# Patient Record
Sex: Male | Born: 1970
Health system: Southern US, Community
[De-identification: ages and names within clinical notes are randomized; demographics above are authoritative.]

## PROBLEM LIST (undated history)

## (undated) DIAGNOSIS — E269 Hyperaldosteronism, unspecified: Secondary | ICD-10-CM

## (undated) DIAGNOSIS — I719 Aortic aneurysm of unspecified site, without rupture: Secondary | ICD-10-CM

## (undated) DIAGNOSIS — M1990 Unspecified osteoarthritis, unspecified site: Secondary | ICD-10-CM

## (undated) DIAGNOSIS — I10 Essential (primary) hypertension: Secondary | ICD-10-CM

## (undated) DIAGNOSIS — J189 Pneumonia, unspecified organism: Secondary | ICD-10-CM

## (undated) DIAGNOSIS — M109 Gout, unspecified: Secondary | ICD-10-CM

## (undated) DIAGNOSIS — I4819 Other persistent atrial fibrillation: Secondary | ICD-10-CM

## (undated) DIAGNOSIS — K219 Gastro-esophageal reflux disease without esophagitis: Secondary | ICD-10-CM

## (undated) DIAGNOSIS — F419 Anxiety disorder, unspecified: Secondary | ICD-10-CM

## (undated) DIAGNOSIS — C4359 Malignant melanoma of other part of trunk: Secondary | ICD-10-CM

## (undated) HISTORY — PX: FRACTURE SURGERY: SHX138

## (undated) HISTORY — PX: JOINT REPLACEMENT: SHX530

## (undated) HISTORY — DX: Other persistent atrial fibrillation: I48.19

## (undated) HISTORY — DX: Gout, unspecified: M10.9

## (undated) HISTORY — DX: Aortic aneurysm of unspecified site, without rupture: I71.9

## (undated) HISTORY — PX: VASECTOMY: SHX75

## (undated) HISTORY — PX: MELANOMA EXCISION: SHX5266

---

## 2000-02-27 ENCOUNTER — Encounter: Admission: RE | Admit: 2000-02-27 | Discharge: 2000-02-27 | Payer: Self-pay | Admitting: *Deleted

## 2000-02-27 ENCOUNTER — Encounter: Payer: Self-pay | Admitting: *Deleted

## 2000-03-12 ENCOUNTER — Encounter: Payer: Self-pay | Admitting: *Deleted

## 2000-03-12 ENCOUNTER — Encounter: Admission: RE | Admit: 2000-03-12 | Discharge: 2000-03-12 | Payer: Self-pay | Admitting: *Deleted

## 2002-07-25 ENCOUNTER — Emergency Department (HOSPITAL_COMMUNITY): Admission: EM | Admit: 2002-07-25 | Discharge: 2002-07-25 | Payer: Self-pay | Admitting: Emergency Medicine

## 2003-02-24 ENCOUNTER — Emergency Department (HOSPITAL_COMMUNITY): Admission: EM | Admit: 2003-02-24 | Discharge: 2003-02-24 | Payer: Self-pay | Admitting: Emergency Medicine

## 2003-02-24 ENCOUNTER — Encounter: Payer: Self-pay | Admitting: Emergency Medicine

## 2003-10-29 HISTORY — PX: ORIF NONUNION / MALUNION TIBIAL FRACTURE: SUR944

## 2005-05-02 ENCOUNTER — Encounter: Admission: RE | Admit: 2005-05-02 | Discharge: 2005-05-02 | Payer: Self-pay | Admitting: Specialist

## 2005-05-03 ENCOUNTER — Observation Stay (HOSPITAL_COMMUNITY): Admission: RE | Admit: 2005-05-03 | Discharge: 2005-05-03 | Payer: Self-pay | Admitting: Specialist

## 2005-05-07 ENCOUNTER — Inpatient Hospital Stay (HOSPITAL_COMMUNITY): Admission: RE | Admit: 2005-05-07 | Discharge: 2005-05-10 | Payer: Self-pay | Admitting: Orthopedic Surgery

## 2007-06-13 ENCOUNTER — Emergency Department (HOSPITAL_COMMUNITY): Admission: EM | Admit: 2007-06-13 | Discharge: 2007-06-13 | Payer: Self-pay | Admitting: Emergency Medicine

## 2007-06-14 ENCOUNTER — Ambulatory Visit: Payer: Self-pay | Admitting: Internal Medicine

## 2007-06-14 ENCOUNTER — Inpatient Hospital Stay (HOSPITAL_COMMUNITY): Admission: EM | Admit: 2007-06-14 | Discharge: 2007-06-16 | Payer: Self-pay | Admitting: Emergency Medicine

## 2007-06-16 ENCOUNTER — Inpatient Hospital Stay (HOSPITAL_COMMUNITY): Admission: AD | Admit: 2007-06-16 | Discharge: 2007-06-18 | Payer: Self-pay | Admitting: Psychiatry

## 2007-06-16 ENCOUNTER — Ambulatory Visit: Payer: Self-pay | Admitting: Psychiatry

## 2011-03-12 NOTE — Discharge Summary (Signed)
NAMEAIMAR, Gregory Barrett                ACCOUNT NO.:  [id number]   MEDICAL RECORD NO.:  [id number]          PATIENT TYPE:  INP   LOCATION:  1440                         FACILITY:  [facility]   PHYSICIAN:  Casimiro Needle B. Sherene Sires, MD, FCCPDATE OF BIRTH:  11-09-72   DATE OF ADMISSION:  06/14/2007  DATE OF DISCHARGE:  06/16/2007                               DISCHARGE SUMMARY   DISCHARGE DIAGNOSES:  1. Status post acute respiratory failure - ventilator dependent      respiratory failure secondary to benzodiazepine overdose      (resolved).  2. Anxiety/depression status post attempted suicide, resolved altered      mental status following benzodiazepine overdose.   LABORATORY DATA:  Urine drug screen obtained on June 13, 2007  demonstrated positive for benzodiazepines only.  Salicylate level on  June 14, 2007 was less than 4, ethanol level less than 5.  June 14, 2007 sodium 143, potassium 3.5, chloride 111, CO2 26, glucose 103, BUN  12, creatinine 1.01.  White blood cell count was 7.7, hemoglobin 14.8,  hematocrit 42.1, platelet count 353.   BRIEF HISTORY:  This is a 40 year old white male who expressed suicidal  ideations in the evening on June 13, 2007.  He was sent home after  initial psychiatric screening in the emergency room.  After arriving at  home he reportedly took 25 Xanax tablets which he had prescribed for  anxiety at home at approximately 7 p.m. on the 16th.  The [place]  pulmonary critical care team was asked to evaluate.  On initial  evaluation he was obtunded following suicide attempt.  He was intubated  and placed on mechanical ventilation and transferred to the intensive  care.   HOSPITAL COURSE BY DISCHARGE DIAGNOSES:  1. Acute respiratory failure/ventilator-dependent respiratory failure      in the setting of altered mental status following benzodiazepine      overdose.  Gregory Barrett as previously mentioned was placed on      mechanical ventilation following intentional  Xanax overdose.  He      was transferred to the intensive care and maintained on mechanical      ventilation until he regained normal level of consciousness.  He      was successfully extubated on June 14, 2007 which was later on in      the day after initial evaluation.  Upon time of discharge Mr.      Barrett is breathing without difficulty on room air.  His breath      sounds are clear to auscultation.  He has no cough, no shortness of      breath or chest discomfort.  There have been no pulmonary sequelae      from the above noted events.  2. Depression, anxiety status post altered mental status in the      setting of intentional benzodiazepine overdose.  As previously      mentioned Gregory Barrett had been seen earlier on the 16th in the      emergency room expressing concerns over desiring to harm self.  He  was released after evaluation.  He went home and proceeded to take      reportedly 25 Xanax.  He developed decreased level of consciousness      secondary to benzodiazepine overdose; therefore required short      course of mechanical ventilation.  Gregory Barrett mental status      returned to baseline later on in the day on June 14, 2007, and he      was successfully extubated.  Psychiatric consultation was obtained      by Dr. Antonietta Breach.  It was the formal impression that Mr.      Barrett should continue his scheduled Zoloft at this time.  Continue      suicide precautions and should be admitted to psychiatric inpatient      unit for dual diagnoses treatment at time of medical clearance.   DISPOSITION:  Gregory Barrett has met maximum hospital benefit from inpatient  care.  He is now medically cleared for discharge to inpatient  psychiatric unit.   PHYSICAL EXAMINATION:  VITAL SIGNS:  Upon time of discharge his  temperature is 97.9, heart rate 62, respirations 16-20, blood pressure  112/73, saturations 99% on room air.  HEAD AND NECK:  Normocephalic, no JVD or adenopathy.   Sclerae are  nonicteric.  Mucous membranes are moist.  PULMONARY:  Clear to auscultation.  CARDIAC:  Regular rate and rhythm.  ABDOMEN:  Soft, nontender without organomegaly.  GU:  Voiding without difficulty.   DISCHARGE MEDICATIONS:  Zoloft 100 mg p.o. daily.   DIET:  As tolerated.  Currently tolerating regular diet without  difficulty.   ACTIVITY:  Cleared for full independent activity with observation of  sitter until cleared by psychiatric services.  Again Gregory Barrett is now  medically cleared for discharge to the inpatient psych.      Zenia Resides, NP      Charlaine Dalton. Sherene Sires, MD, [facility]  Electronically Signed    PB/MEDQ  D:  06/16/2007  T:  06/16/2007  Job:  [id number]   cc:   Antonietta Breach, M.D.
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
## 2011-03-15 NOTE — Discharge Summary (Signed)
Gregory Barrett, RAYNER NO.:  [id number]   MEDICAL RECORD NO.:  [id number]          PATIENT TYPE:  IPS   LOCATION:  0506                          FACILITY:  [facility]   PHYSICIAN:  Geoffery Lyons, M.D.      DATE OF BIRTH:  10/18/1971   DATE OF ADMISSION:  06/16/2007  DATE OF DISCHARGE:  06/18/2007                               DISCHARGE SUMMARY   CHIEF COMPLAINT AND PRESENT ILLNESS:  This was the first admission to  [facility] for this 40 year old single white male  voluntarily admitted.  Endorsed he has a lot of stress from work, that  he brings home his stressors.  Works on an Interior and spatial designer.  He is  worried about the economy and how it affects the company.  Overdosed on  Xanax on June 13, 2007.  Endorsed that at that time he was feeling  like hurting himself but this was impulsive.  Was taken to the [facility] ED and admitted to the ICU due to respiratory distress/failure.  Admits depression for the last four weeks, anhedonia, increased stress  at work.   PAST PSYCHIATRIC HISTORY:  No prior inpatient treatment.  Had couple's  therapy with prior marriage.  Also sort out therapy after his  grandfather died in Mar 14, 2000.   ALCOHOL/DRUG HISTORY:  Drinks alcohol with intention of intoxication  every weekend, around five drinks.  No other substances.   MEDICAL HISTORY:  Migraines, tension headache.   MEDICATIONS:  Zoloft and Xanax.   PHYSICAL EXAMINATION:  Performed and failed to show any acute findings.   LABORATORY DATA:  Results not available in the chart.   MENTAL STATUS EXAM:  Alert cooperative male, good eye contact.  Casually  dressed.  Speech regular in rate, rhythm and tone.  Mood anxious,  depressed.  Affect broad.  Thought processes logical, coherent and  relevant.  No active suicidal ideas.  No homicidal ideas.  Ruminating  about the stress at work.  No hallucinations.  Cognition well-preserved.   ADMISSION DIAGNOSES:  AXIS I:  Major  depressive disorder.  Anxiety not  otherwise specified.  AXIS II:  No diagnosis.  AXIS III:  Migraines, tension headache.  AXIS IV:  Moderate.  AXIS V:  GAF upon admission 45; highest GAF in the last year 70.   HOSPITAL COURSE:  He was admitted.  He was started in individual and  group psychotherapy.  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  Unrealistic expectations.  Brings stress from work  home.  Endorsed that he puts it on his wife with increased stress,  endorsed chest pain.  Taken to [facility].  He was given Xanax, told  that he had a panic attack.  He endorsed persistent episodes of anxiety,  feeling panicky, living with girlfriend and a 66-month-old child, has a  17 year old with ex-wife.  In the family session, on June 17, 2007,  with the girlfriend, he was able to share how he was having a hard time  dealing with the stress from work.  Expressed a lot of frustration.  He  was willing to work on himself, work on Pharmacologist.  Girlfriend was  supportive.  On June 18, 2007, endorsed that he was focused, he was  committed to do well.  Endorsed that he found out that things were  addressed at work before he came in so he is going to go into a less  stressful situation.  Overall, he was encouraged, hopeful.  No suicidal  or homicidal ideation.  Feeling better than when he was admitted.   DISCHARGE DIAGNOSES:  AXIS I:  Major depressive episode.  Panic attacks.  AXIS II:  No diagnosis.  AXIS III:  Migraines, tension headache.  AXIS IV:  Moderate.  AXIS V:  GAF upon discharge 55-60.   DISCHARGE MEDICATIONS:  Zoloft 100 mg, 1-1/2 daily.   FOLLOWUP:  Daun Peacock, [facility].      Geoffery Lyons, M.D.  Electronically Signed     IL/MEDQ  D:  07/20/2007  T:  07/20/2007  Job:  [id number]
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
## 2011-08-07 ENCOUNTER — Ambulatory Visit (INDEPENDENT_AMBULATORY_CARE_PROVIDER_SITE_OTHER): Payer: Managed Care, Other (non HMO) | Admitting: Internal Medicine

## 2011-08-07 ENCOUNTER — Encounter: Payer: Self-pay | Admitting: Internal Medicine

## 2011-08-07 DIAGNOSIS — M25561 Pain in right knee: Secondary | ICD-10-CM

## 2011-08-07 DIAGNOSIS — Z0000 Encounter for general adult medical examination without abnormal findings: Secondary | ICD-10-CM

## 2011-08-07 DIAGNOSIS — M25569 Pain in unspecified knee: Secondary | ICD-10-CM

## 2011-08-07 MED ORDER — HYDROCODONE-ACETAMINOPHEN 5-325 MG PO TABS: 1.0000 | ORAL_TABLET | Freq: Four times a day (QID) | ORAL | Status: DC | PRN
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
## 2011-08-07 NOTE — Patient Instructions (Signed)
Limit your sodium (Salt) intake  Please check your blood pressure on a regular basis.  If it is consistently greater than 150/90, please make an office appointment.    It is important that you exercise regularly, at least 20 minutes 3 to 4 times per week.  If you develop chest pain or shortness of breath seek  medical attention.  You need to lose weight.  Consider a lower calorie diet and regular exercise.  Avoids foods high in acid such as tomatoes citrus juices, and spicy foods.  Avoid eating within two hours of lying down or before exercising.  Do not overheat.  Try smaller more frequent meals.  If symptoms persist, elevate the head of her bed 12 inches while sleeping.  Acid Reflux (GERD) Acid reflux is also called gastroesophageal reflux disease (GERD). Your stomach makes acid to help digest food. Acid reflux happens when acid from your stomach goes into the tube between your mouth and stomach (esophagus). Your stomach is protected from the acid, but this tube is not. When acid gets into the tube, it may cause a burning feeling in the chest (heartburn). Besides heartburn, other health problems can happen if the acid keeps going into the tube. Some causes of acid reflux include:  Being overweight.   Smoking.   Drinking alcohol.   Eating large meals.   Eating meals and then going to bed right away.   Eating certain foods.   Increased stomach acid production.  HOME CARE  Take all medicine as told by your doctor.   You may need to:   Lose weight.   Avoid alcohol.   Quit smoking.   Do not eat big meals. It is better to eat smaller meals throughout the day.   Do not eat a meal and then nap or go to bed.   Sleep with your head higher than your stomach.   Avoid foods that bother you.   You may need more tests, or you may need to see a special doctor.  GET HELP RIGHT AWAY IF:  You have chest pain that is different than before.   You have pain that goes to your arms, jaw,  or between your shoulder blades.   You throw up (vomit) blood, dark brown liquid, or your throw up looks like coffee grounds.   You have trouble swallowing.   You have trouble breathing or cannot stop coughing.   You feel dizzy or pass out.   Your skin is cool, wet, and pale.   Your medicine is not helping.  MAKE SURE YOU:    Understand these instructions.   Will watch your condition.   Will get help right away if you are not doing well or get worse.  Document Released: 04/01/2008 Document Re-Released: 01/08/2010 [facility] Patient Information 2011 [place], [place].

## 2011-08-09 LAB — BASIC METABOLIC PANEL
CO2: 30
Calcium: 9.2
Chloride: 107
Creatinine, Ser: 1.05
Potassium: 3.6
Sodium: 142

## 2011-08-09 LAB — URINALYSIS, ROUTINE W REFLEX MICROSCOPIC
Bilirubin Urine: NEGATIVE
Glucose, UA: NEGATIVE
Nitrite: NEGATIVE
Nitrite: NEGATIVE
Urobilinogen, UA: 1
pH: 6.5

## 2011-08-09 LAB — DIFFERENTIAL
Basophils Absolute: 0.1
Basophils Relative: 1
Basophils Relative: 1
Lymphs Abs: 2.1
Neutro Abs: 4.5
Neutrophils Relative %: 62

## 2011-08-09 LAB — COMPREHENSIVE METABOLIC PANEL
AST: 21
BUN: 12
GFR calc Af Amer: >60
GFR calc non Af Amer: >60
Glucose, Bld: 103 — ABNORMAL HIGH
Potassium: 3.5
Sodium: 143

## 2011-08-09 LAB — RAPID URINE DRUG SCREEN, HOSP PERFORMED
Amphetamines: NOT DETECTED
Barbiturates: NOT DETECTED
Opiates: NOT DETECTED

## 2011-08-09 LAB — CBC
HCT: 45.6
Hemoglobin: 14.8
Hemoglobin: 15.7
MCV: 88.1
Platelets: 353
RBC: 4.78
RDW: 13.5

## 2011-08-09 LAB — ETHANOL
Alcohol, Ethyl (B): <5
Alcohol, Ethyl (B): <5

## 2011-08-09 LAB — ACETAMINOPHEN LEVEL: Acetaminophen (Tylenol), Serum: <10 — ABNORMAL LOW

## 2011-08-09 LAB — SALICYLATE LEVEL: Salicylate Lvl: <4

## 2011-11-18 ENCOUNTER — Other Ambulatory Visit: Payer: Self-pay | Admitting: Internal Medicine

## 2011-11-18 NOTE — Telephone Encounter (Signed)
#  60  RF 2  

## 2011-11-18 NOTE — Telephone Encounter (Signed)
Last seen new to establish 08/07/11  Last written 08/07/11 # 60 2RF Please advise

## 2011-12-09 ENCOUNTER — Encounter: Payer: Self-pay | Admitting: Internal Medicine

## 2011-12-09 ENCOUNTER — Ambulatory Visit (INDEPENDENT_AMBULATORY_CARE_PROVIDER_SITE_OTHER): Payer: Managed Care, Other (non HMO) | Admitting: Internal Medicine

## 2011-12-09 VITALS — BP 124/84 | HR 85 | Temp 98.1°F | Wt 283.0 lb

## 2011-12-09 DIAGNOSIS — J069 Acute upper respiratory infection, unspecified: Secondary | ICD-10-CM

## 2011-12-09 MED ORDER — HYDROCODONE-HOMATROPINE 5-1.5 MG/5ML PO SYRP: 5.0000 mL | ORAL_SOLUTION | Freq: Four times a day (QID) | ORAL | Status: AC | PRN

## 2011-12-09 MED ORDER — SILDENAFIL CITRATE 100 MG PO TABS: 100.0000 mg | ORAL_TABLET | ORAL | Status: DC | PRN

## 2011-12-09 NOTE — Progress Notes (Signed)
  Subjective:    Patient ID: Gregory Barrett, male    DOB: [birth year], 41 y.o.   MRN: [id number]  HPI  41 year old patient who presents with a one-week history of chest congestion and cough earlier he had low-grade fever that has resolved he has mild sore throat fatigue and a general sense of unwellness. Cough is mildly productive of some yellow sputum.    Review of Systems  Constitutional: Positive for fever and fatigue. Negative for chills and appetite change.  HENT: Positive for congestion and postnasal drip. Negative for hearing loss, ear pain, sore throat, trouble swallowing, neck stiffness, dental problem, voice change and tinnitus.   Eyes: Negative for pain, discharge and visual disturbance.  Respiratory: Positive for cough. Negative for chest tightness, wheezing and stridor.   Cardiovascular: Negative for chest pain, palpitations and leg swelling.  Gastrointestinal: Negative for nausea, vomiting, abdominal pain, diarrhea, constipation, blood in stool and abdominal distention.  Genitourinary: Negative for urgency, hematuria, flank pain, discharge, difficulty urinating and genital sores.  Musculoskeletal: Negative for myalgias, back pain, joint swelling, arthralgias and gait problem.  Skin: Negative for rash.  Neurological: Negative for dizziness, syncope, speech difficulty, weakness, numbness and headaches.  Hematological: Negative for adenopathy. Does not bruise/bleed easily.  Psychiatric/Behavioral: Negative for behavioral problems and dysphoric mood. The patient is not nervous/anxious.        Objective:   Physical Exam  Constitutional: He is oriented to person, place, and time. He appears well-developed and well-nourished. No distress.       Blood pressure 140/94  HENT:  Head: Normocephalic.  Right Ear: External ear normal.  Left Ear: External ear normal.  Eyes: Conjunctivae and EOM are normal.  Neck: Normal range of motion.  Cardiovascular: Normal rate and normal heart  sounds.   Pulmonary/Chest: Breath sounds normal.  Abdominal: Bowel sounds are normal.  Musculoskeletal: Normal range of motion. He exhibits no edema and no tenderness.  Neurological: He is alert and oriented to person, place, and time.  Psychiatric: He has a normal mood and affect. His behavior is normal.          Assessment & Plan:   Viral URI. Will treat symptomatically Exogenous obesity weight loss encouraged Hypertension. We'll ask patient to monitor home blood pressure readings

## 2011-12-09 NOTE — Patient Instructions (Signed)
Get plenty of rest, Drink lots of  clear liquids, and use Tylenol or ibuprofen for fever and discomfort.    Limit your sodium (Salt) intake  You need to lose weight.  Consider a lower calorie diet and regular exercise.  Please check your blood pressure on a regular basis.  If it is consistently greater than 150/90, please make an office appointment.

## 2012-02-05 ENCOUNTER — Other Ambulatory Visit: Payer: Self-pay | Admitting: Internal Medicine

## 2012-02-05 NOTE — Telephone Encounter (Signed)
Sent denial with needs appt to discuss

## 2012-02-05 NOTE — Telephone Encounter (Signed)
Last seen 12/09/11 - congestion Last written 11/18/11 # 60 2RF Please advise

## 2012-02-05 NOTE — Telephone Encounter (Signed)
Why?  

## 2012-02-11 ENCOUNTER — Other Ambulatory Visit: Payer: Self-pay | Admitting: Internal Medicine

## 2012-02-12 NOTE — Telephone Encounter (Signed)
Called in norco only

## 2012-02-12 NOTE — Telephone Encounter (Signed)
norco only   #90  RF 1

## 2012-02-12 NOTE — Telephone Encounter (Signed)
Last seen 12/09/11 - URI  norco last written 11/18/11 # 60  2RF Hycodan last written 12/09/11 0RF Please advise on both meds

## 2012-05-11 ENCOUNTER — Other Ambulatory Visit: Payer: Self-pay | Admitting: Internal Medicine

## 2012-05-11 NOTE — Telephone Encounter (Signed)
Called in.

## 2012-05-11 NOTE — Telephone Encounter (Signed)
Last seen 12/11/11 - URI Last written 02/11/12 #90  0Rf Please advise

## 2012-05-11 NOTE — Telephone Encounter (Signed)
ok 

## 2012-06-25 ENCOUNTER — Ambulatory Visit (INDEPENDENT_AMBULATORY_CARE_PROVIDER_SITE_OTHER): Payer: Managed Care, Other (non HMO) | Admitting: Internal Medicine

## 2012-06-25 ENCOUNTER — Encounter: Payer: Self-pay | Admitting: Internal Medicine

## 2012-06-25 VITALS — BP 140/90 | Temp 98.3°F | Wt 281.0 lb

## 2012-06-25 DIAGNOSIS — M25561 Pain in right knee: Secondary | ICD-10-CM

## 2012-06-25 DIAGNOSIS — M25569 Pain in unspecified knee: Secondary | ICD-10-CM

## 2012-06-25 DIAGNOSIS — E6609 Other obesity due to excess calories: Secondary | ICD-10-CM | POA: Insufficient documentation

## 2012-06-25 DIAGNOSIS — E669 Obesity, unspecified: Secondary | ICD-10-CM

## 2012-06-25 MED ORDER — HYDROCODONE-ACETAMINOPHEN 10-325 MG PO TABS: 1.0000 | ORAL_TABLET | Freq: Four times a day (QID) | ORAL | Status: AC | PRN

## 2012-06-25 MED ORDER — SILDENAFIL CITRATE 100 MG PO TABS: 100.0000 mg | ORAL_TABLET | ORAL | Status: DC | PRN

## 2012-06-25 NOTE — Patient Instructions (Signed)
Take Aleve 200 mg twice daily for pain or swelling  Call or return to clinic prn if these symptoms worsen or fail to improve as anticipated.  

## 2012-06-25 NOTE — Progress Notes (Signed)
  Subjective:    Patient ID: Gregory Barrett, male    DOB: [birth year], 41 y.o.   MRN: [id number]  HPI   41 year old patient who is seen today for followup of his right knee pain.  He is followed by orthopedics Carola Frost) who feels that he will need a right knee revision with removal of the scar tissue. The patient will require a 6 weeks leave of absence and will do this at a later date. He states he takes 2 Vicodin in the morning and another 2 in the early afternoon to get through the day. He is quite active and occasionally up and down stairs;  he has swelling towards the day as well as worsening pain    Review of Systems  Musculoskeletal: Positive for joint swelling and gait problem.       Objective:   Physical Exam  Constitutional: He appears well-developed and well-nourished. No distress.       Obese. Normal blood pressure  Musculoskeletal:       Multiple scars and some soft tissue swelling about the right knee          Assessment & Plan:  Chronic right knee pain status post tibial fracture.  We'll continue analgesics and a new prescription dispensed. Will give a trial of Aleve twice daily. He'll attempt to modify his work activities. FMLA encouraged

## 2012-07-06 DIAGNOSIS — Z0279 Encounter for issue of other medical certificate: Secondary | ICD-10-CM

## 2012-12-22 ENCOUNTER — Telehealth: Payer: Self-pay | Admitting: Internal Medicine

## 2012-12-22 NOTE — Telephone Encounter (Signed)
Pt needs refill on norco 10-325mg  call into [facility]

## 2012-12-22 NOTE — Telephone Encounter (Signed)
Ok #90

## 2012-12-23 MED ORDER — HYDROCODONE-ACETAMINOPHEN 5-325 MG PO TABS: 1.0000 | ORAL_TABLET | Freq: Four times a day (QID) | ORAL | Status: DC | PRN

## 2012-12-23 NOTE — Telephone Encounter (Signed)
Pt notified Rx refill called into pharmacy for Norco.

## 2014-05-09 ENCOUNTER — Ambulatory Visit (INDEPENDENT_AMBULATORY_CARE_PROVIDER_SITE_OTHER): Payer: BC Managed Care – PPO | Admitting: Internal Medicine

## 2014-05-09 ENCOUNTER — Encounter: Payer: Self-pay | Admitting: Internal Medicine

## 2014-05-09 VITALS — BP 150/90 | HR 117 | Temp 99.8°F | Resp 20 | Ht 70.5 in | Wt 296.0 lb

## 2014-05-09 DIAGNOSIS — E669 Obesity, unspecified: Secondary | ICD-10-CM

## 2014-05-09 DIAGNOSIS — M25561 Pain in right knee: Secondary | ICD-10-CM

## 2014-05-09 DIAGNOSIS — E6609 Other obesity due to excess calories: Secondary | ICD-10-CM

## 2014-05-09 DIAGNOSIS — S93409A Sprain of unspecified ligament of unspecified ankle, initial encounter: Secondary | ICD-10-CM

## 2014-05-09 DIAGNOSIS — S93402A Sprain of unspecified ligament of left ankle, initial encounter: Secondary | ICD-10-CM

## 2014-05-09 DIAGNOSIS — M25569 Pain in unspecified knee: Secondary | ICD-10-CM

## 2014-05-09 MED ORDER — MELOXICAM 15 MG PO TABS: 15.0000 mg | ORAL_TABLET | Freq: Every day | ORAL | Status: DC

## 2014-05-09 MED ORDER — HYDROCODONE-ACETAMINOPHEN 5-325 MG PO TABS: 1.0000 | ORAL_TABLET | Freq: Four times a day (QID) | ORAL | Status: DC | PRN

## 2014-05-09 NOTE — Progress Notes (Signed)
Pre visit review using our clinic review tool, if applicable. No additional management support is needed unless otherwise documented below in the visit note. 
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
## 2014-05-09 NOTE — Patient Instructions (Addendum)
Limit your sodium (Salt) intake    It is important that you exercise regularly, at least 20 minutes 3 to 4 times per week.  If you develop chest pain or shortness of breath seek  medical attention.  You need to lose weight.  Consider a lower calorie diet and regular exercise.  Return in 6 months for follow-up Ankle Sprain An ankle sprain is an injury to the strong, fibrous tissues (ligaments) that hold the bones of your ankle joint together.  CAUSES An ankle sprain is usually caused by a fall or by twisting your ankle. Ankle sprains most commonly occur when you step on the outer edge of your foot, and your ankle turns inward. People who participate in sports are more prone to these types of injuries.  SYMPTOMS   Pain in your ankle. The pain may be present at rest or only when you are trying to stand or walk.  Swelling.  Bruising. Bruising may develop immediately or within 1 to 2 days after your injury.  Difficulty standing or walking, particularly when turning corners or changing directions. DIAGNOSIS  Your caregiver will ask you details about your injury and perform a physical exam of your ankle to determine if you have an ankle sprain. During the physical exam, your caregiver will press on and apply pressure to specific areas of your foot and ankle. Your caregiver will try to move your ankle in certain ways. An X-ray exam may be done to be sure a bone was not broken or a ligament did not separate from one of the bones in your ankle (avulsion fracture).  TREATMENT  Certain types of braces can help stabilize your ankle. Your caregiver can make a recommendation for this. Your caregiver may recommend the use of medicine for pain. If your sprain is severe, your caregiver may refer you to a surgeon who helps to restore function to parts of your skeletal system (orthopedist) or a physical therapist. [place] ice to your injury for 1-2 days or as directed by your caregiver.  Applying ice helps to reduce inflammation and pain.  Put ice in a plastic bag.  Place a towel between your skin and the bag.  Leave the ice on for 15-20 minutes at a time, every 2 hours while you are awake.  Only take over-the-counter or prescription medicines for pain, discomfort, or fever as directed by your caregiver.  Elevate your injured ankle above the level of your heart as much as possible for 2-3 days.  If your caregiver recommends crutches, use them as instructed. Gradually put weight on the affected ankle. Continue to use crutches or a cane until you can walk without feeling pain in your ankle.  If you have a plaster splint, wear the splint as directed by your caregiver. Do not rest it on anything harder than a pillow for the first 24 hours. Do not put weight on it. Do not get it wet. You may take it off to take a shower or bath.  You may have been given an elastic bandage to wear around your ankle to provide support. If the elastic bandage is too tight (you have numbness or tingling in your foot or your foot becomes cold and blue), adjust the bandage to make it comfortable.  If you have an air splint, you may blow more air into it or let air out to make it more comfortable. You may take your splint off at night and before taking a shower  or bath. Wiggle your toes in the splint several times per day to decrease swelling. SEEK MEDICAL CARE IF:   You have rapidly increasing bruising or swelling.  Your toes feel extremely cold or you lose feeling in your foot.  Your pain is not relieved with medicine. SEEK IMMEDIATE MEDICAL CARE IF:  Your toes are numb or blue.  You have severe pain that is increasing. MAKE SURE YOU:   Understand these instructions.  Will watch your condition.  Will get help right away if you are not doing well or get worse. Document Released: 10/14/2005 Document Revised: 07/08/2012 Document Reviewed: 10/26/2011 [facility] Patient Information 2015  [place], [place]. This information is not intended to replace advice given to you by your health care provider. Make sure you discuss any questions you have with your health care provider.

## 2014-06-03 ENCOUNTER — Telehealth: Payer: Self-pay | Admitting: Internal Medicine

## 2014-06-03 NOTE — Telephone Encounter (Signed)
Pt states the FMLA forms faxed to his employer today were incomplete.  Pt states he will stop by the office to pick up a copy of the form that was faxed as these have to be turned in today or he will be suspended from his job. Please return call to pt to follow up.

## 2014-06-03 NOTE — Telephone Encounter (Signed)
Pt came by office and forms completed properly, faxed to [facility] and copy given to pt to take with him for employer.

## 2014-06-28 ENCOUNTER — Telehealth: Payer: Self-pay | Admitting: Internal Medicine

## 2014-06-28 NOTE — Telephone Encounter (Signed)
Pt request refill of the following:   HYDROcodone-acetaminophen (NORCO/VICODIN) 5-325 MG per tablet ° ° ° °Phamacy:   Pick up  ° °

## 2014-06-29 MED ORDER — HYDROCODONE-ACETAMINOPHEN 5-325 MG PO TABS: 1.0000 | ORAL_TABLET | Freq: Four times a day (QID) | ORAL | Status: DC | PRN

## 2014-06-29 NOTE — Telephone Encounter (Signed)
Pt notified Rx ready for pickup. Rx printed and signed.  

## 2014-07-01 ENCOUNTER — Encounter: Payer: Self-pay | Admitting: *Deleted

## 2014-07-01 ENCOUNTER — Encounter: Payer: Self-pay | Admitting: Internal Medicine

## 2014-07-01 ENCOUNTER — Ambulatory Visit (INDEPENDENT_AMBULATORY_CARE_PROVIDER_SITE_OTHER): Payer: BC Managed Care – PPO | Admitting: Internal Medicine

## 2014-07-01 VITALS — BP 160/90 | HR 93 | Temp 98.0°F | Resp 20 | Ht 71.0 in | Wt 290.0 lb

## 2014-07-01 DIAGNOSIS — Z0000 Encounter for general adult medical examination without abnormal findings: Secondary | ICD-10-CM

## 2014-07-01 DIAGNOSIS — Z23 Encounter for immunization: Secondary | ICD-10-CM

## 2014-07-01 DIAGNOSIS — I10 Essential (primary) hypertension: Secondary | ICD-10-CM | POA: Insufficient documentation

## 2014-07-01 LAB — TSH: TSH: 1.04 u[IU]/mL (ref 0.35–4.50)

## 2014-07-01 LAB — CBC WITH DIFFERENTIAL/PLATELET
Basophils Absolute: 0.1 10*3/uL (ref 0.0–0.1)
Basophils Relative: 0.7 % (ref 0.0–3.0)
Eosinophils Absolute: 0.3 10*3/uL (ref 0.0–0.7)
Eosinophils Relative: 3.4 % (ref 0.0–5.0)
HEMATOCRIT: 44.6 % (ref 39.0–52.0)
Hemoglobin: 15.2 g/dL (ref 13.0–17.0)
Lymphocytes Relative: 23.3 % (ref 12.0–46.0)
Lymphs Abs: 1.8 10*3/uL (ref 0.7–4.0)
MCHC: 34.1 g/dL (ref 30.0–36.0)
MCV: 89.2 fl (ref 78.0–100.0)
MONOS PCT: 9.6 % (ref 3.0–12.0)
Monocytes Absolute: 0.7 10*3/uL (ref 0.1–1.0)
Neutro Abs: 4.9 10*3/uL (ref 1.4–7.7)
Neutrophils Relative %: 63 % (ref 43.0–77.0)
PLATELETS: 314 10*3/uL (ref 150.0–400.0)
RBC: 5.01 Mil/uL (ref 4.22–5.81)
RDW: 13.3 % (ref 11.5–15.5)
WBC: 7.8 10*3/uL (ref 4.0–10.5)

## 2014-07-01 LAB — COMPREHENSIVE METABOLIC PANEL
ALT: 28 U/L (ref 0–53)
AST: 21 U/L (ref 0–37)
Albumin: 4.3 g/dL (ref 3.5–5.2)
Alkaline Phosphatase: 65 U/L (ref 39–117)
BILIRUBIN TOTAL: 1.2 mg/dL (ref 0.2–1.2)
BUN: 17 mg/dL (ref 6–23)
CO2: 27 meq/L (ref 19–32)
Calcium: 8.6 mg/dL (ref 8.4–10.5)
Chloride: 105 mEq/L (ref 96–112)
Creatinine, Ser: 1.1 mg/dL (ref 0.4–1.5)
GFR: 77.68 mL/min (ref >60.00)
GLUCOSE: 77 mg/dL (ref 70–99)
Potassium: 3 mEq/L — ABNORMAL LOW (ref 3.5–5.1)
SODIUM: 141 meq/L (ref 135–145)
TOTAL PROTEIN: 7.4 g/dL (ref 6.0–8.3)

## 2014-07-01 LAB — LIPID PANEL
CHOLESTEROL: 168 mg/dL (ref 0–200)
HDL: 38 mg/dL — ABNORMAL LOW (ref >39.00)
LDL Cholesterol: 115 mg/dL — ABNORMAL HIGH (ref 0–99)
NonHDL: 130
TRIGLYCERIDES: 77 mg/dL (ref 0.0–149.0)
Total CHOL/HDL Ratio: 4
VLDL: 15.4 mg/dL (ref 0.0–40.0)

## 2014-07-01 MED ORDER — LISINOPRIL-HYDROCHLOROTHIAZIDE 20-25 MG PO TABS: 1.0000 | ORAL_TABLET | Freq: Every day | ORAL | Status: DC
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
## 2014-07-01 NOTE — Progress Notes (Signed)
Pre visit review using our clinic review tool, if applicable. No additional management support is needed unless otherwise documented below in the visit note. 

## 2014-07-01 NOTE — Patient Instructions (Signed)
Limit your sodium (Salt) intake  You need to lose weight.  Consider a lower calorie diet and regular exercise.    It is important that you exercise regularly, at least 20 minutes 3 to 4 times per week.  If you develop chest pain or shortness of breath seek  medical attention.  Please check your blood pressure on a regular basis.  If it is consistently greater than 150/90, please make an office appointment.   DASH Eating Plan DASH stands for "Dietary Approaches to Stop Hypertension." The DASH eating plan is a healthy eating plan that has been shown to reduce high blood pressure (hypertension). Additional health benefits may include reducing the risk of type 2 diabetes mellitus, heart disease, and stroke. The DASH eating plan may also help with weight loss. WHAT DO I NEED TO KNOW ABOUT THE DASH EATING PLAN? For the DASH eating plan, you will follow these general guidelines:  Choose foods with a percent daily value for sodium of less than 5% (as listed on the food label).  Use salt-free seasonings or herbs instead of table salt or sea salt.  Check with your health care provider or pharmacist before using salt substitutes.  Eat lower-sodium products, often labeled as "lower sodium" or "no salt added."  Eat fresh foods.  Eat more vegetables, fruits, and low-fat dairy products.  Choose whole grains. Look for the word "whole" as the first word in the ingredient list.  Choose fish and skinless chicken or [place] more often than red meat. Limit fish, poultry, and meat to 6 oz (170 g) each day.  Limit sweets, desserts, sugars, and sugary drinks.  Choose heart-healthy fats.  Limit cheese to 1 oz (28 g) per day.  Eat more home-cooked food and less restaurant, buffet, and fast food.  Limit fried foods.  Cook foods using methods other than frying.  Limit canned vegetables. If you do use them, rinse them well to decrease the sodium.  When eating at a restaurant, ask that your food be  prepared with less salt, or no salt if possible. WHAT FOODS CAN I EAT? Seek help from a dietitian for individual calorie needs. Grains Whole grain or whole wheat bread. Brown rice. Whole grain or whole wheat pasta. Quinoa, bulgur, and whole grain cereals. Low-sodium cereals. Corn or whole wheat flour tortillas. Whole grain cornbread. Whole grain crackers. Low-sodium crackers. Vegetables Fresh or frozen vegetables (raw, steamed, roasted, or grilled). Low-sodium or reduced-sodium tomato and vegetable juices. Low-sodium or reduced-sodium tomato sauce and paste. Low-sodium or reduced-sodium canned vegetables.  Fruits All fresh, canned (in natural juice), or frozen fruits. Meat and Other Protein Products Ground beef (85% or leaner), grass-fed beef, or beef trimmed of fat. Skinless chicken or [place]. Ground chicken or [place]. Pork trimmed of fat. All fish and seafood. Eggs. Dried beans, peas, or lentils. Unsalted nuts and seeds. Unsalted canned beans. Dairy Low-fat dairy products, such as skim or 1% milk, 2% or reduced-fat cheeses, low-fat ricotta or cottage cheese, or plain low-fat yogurt. Low-sodium or reduced-sodium cheeses. Fats and Oils Tub margarines without trans fats. Light or reduced-fat mayonnaise and salad dressings (reduced sodium). Avocado. Safflower, olive, or canola oils. Natural peanut or almond butter. Other Unsalted popcorn and pretzels. The items listed above may not be a complete list of recommended foods or beverages. Contact your dietitian for more options. WHAT FOODS ARE NOT RECOMMENDED? Grains White bread. White pasta. White rice. Refined cornbread. Bagels and croissants. Crackers that contain trans fat. Vegetables Creamed or fried vegetables.  Vegetables in a cheese sauce. Regular canned vegetables. Regular canned tomato sauce and paste. Regular tomato and vegetable juices. Fruits Dried fruits. Canned fruit in light or heavy syrup. Fruit juice. Meat and Other Protein  Products Fatty cuts of meat. Ribs, chicken wings, bacon, sausage, bologna, salami, chitterlings, fatback, hot dogs, bratwurst, and packaged luncheon meats. Salted nuts and seeds. Canned beans with salt. Dairy Whole or 2% milk, cream, half-and-half, and cream cheese. Whole-fat or sweetened yogurt. Full-fat cheeses or blue cheese. Nondairy creamers and whipped toppings. Processed cheese, cheese spreads, or cheese curds. Condiments Onion and garlic salt, seasoned salt, table salt, and sea salt. Canned and packaged gravies. Worcestershire sauce. Tartar sauce. Barbecue sauce. Teriyaki sauce. Soy sauce, including reduced sodium. Steak sauce. Fish sauce. Oyster sauce. Cocktail sauce. Horseradish. Ketchup and mustard. Meat flavorings and tenderizers. Bouillon cubes. Hot sauce. Tabasco sauce. Marinades. Taco seasonings. Relishes. Fats and Oils Butter, stick margarine, lard, shortening, ghee, and bacon fat. Coconut, palm kernel, or palm oils. Regular salad dressings. Other Pickles and olives. Salted popcorn and pretzels. The items listed above may not be a complete list of foods and beverages to avoid. Contact your dietitian for more information. WHERE CAN I FIND MORE INFORMATION? National Heart, Lung, and Blood Institute: travelstabloid.com Document Released: 10/03/2011 Document Revised: 02/28/2014 Document Reviewed: 08/18/2013 [facility] Patient Information 2015 [place], [place]. This information is not intended to replace advice given to you by your health care provider. Make sure you discuss any questions you have with your health care provider. Hypertension Hypertension, commonly called high blood pressure, is when the force of blood pumping through your arteries is too strong. Your arteries are the blood vessels that carry blood from your heart throughout your body. A blood pressure reading consists of a higher number over a lower number, such as 110/72. The higher number  (systolic) is the pressure inside your arteries when your heart pumps. The lower number (diastolic) is the pressure inside your arteries when your heart relaxes. Ideally you want your blood pressure below 120/80. Hypertension forces your heart to work harder to pump blood. Your arteries may become narrow or stiff. Having hypertension puts you at risk for heart disease, stroke, and other problems.  RISK FACTORS Some risk factors for high blood pressure are controllable. Others are not.  Risk factors you cannot control include:   Race. You may be at higher risk if you are African American.  Age. Risk increases with age.  Gender. Men are at higher risk than women before age 43 years. After age 78, women are at higher risk than men. Risk factors you can control include:  Not getting enough exercise or physical activity.  Being overweight.  Getting too much fat, sugar, calories, or salt in your diet.  Drinking too much alcohol. SIGNS AND SYMPTOMS Hypertension does not usually cause signs or symptoms. Extremely high blood pressure (hypertensive crisis) may cause headache, anxiety, shortness of breath, and nosebleed. DIAGNOSIS  To check if you have hypertension, your health care provider will measure your blood pressure while you are seated, with your arm held at the level of your heart. It should be measured at least twice using the same arm. Certain conditions can cause a difference in blood pressure between your right and left arms. A blood pressure reading that is higher than normal on one occasion does not mean that you need treatment. If one blood pressure reading is high, ask your health care provider about having it checked again. TREATMENT  Treating high blood  pressure includes making lifestyle changes and possibly taking medicine. Living a healthy lifestyle can help lower high blood pressure. You may need to change some of your habits. Lifestyle changes may include:  Following the DASH  diet. This diet is high in fruits, vegetables, and whole grains. It is low in salt, red meat, and added sugars.  Getting at least 2 hours of brisk physical activity every week.  Losing weight if necessary.  Not smoking.  Limiting alcoholic beverages.  Learning ways to reduce stress. If lifestyle changes are not enough to get your blood pressure under control, your health care provider may prescribe medicine. You may need to take more than one. Work closely with your health care provider to understand the risks and benefits. HOME CARE INSTRUCTIONS  Have your blood pressure rechecked as directed by your health care provider.   Take medicines only as directed by your health care provider. Follow the directions carefully. Blood pressure medicines must be taken as prescribed. The medicine does not work as well when you skip doses. Skipping doses also puts you at risk for problems.   Do not smoke.   Monitor your blood pressure at home as directed by your health care provider. SEEK MEDICAL CARE IF:   You think you are having a reaction to medicines taken.  You have recurrent headaches or feel dizzy.  You have swelling in your ankles.  You have trouble with your vision. SEEK IMMEDIATE MEDICAL CARE IF:  You develop a severe headache or confusion.  You have unusual weakness, numbness, or feel faint.  You have severe chest or abdominal pain.  You vomit repeatedly.  You have trouble breathing. MAKE SURE YOU:   Understand these instructions.  Will watch your condition.  Will get help right away if you are not doing well or get worse. Document Released: 10/14/2005 Document Revised: 02/28/2014 Document Reviewed: 08/06/2013 [facility] Patient Information 2015 [place], [place]. This information is not intended to replace advice given to you by your health care provider. Make sure you discuss any questions you have with your health care provider. Health Maintenance A healthy  lifestyle and preventative care can promote health and wellness.  Maintain regular health, dental, and eye exams.  Eat a healthy diet. Foods like vegetables, fruits, whole grains, low-fat dairy products, and lean protein foods contain the nutrients you need and are low in calories. Decrease your intake of foods high in solid fats, added sugars, and salt. Get information about a proper diet from your health care provider, if necessary.  Regular physical exercise is one of the most important things you can do for your health. Most adults should get at least 150 minutes of moderate-intensity exercise (any activity that increases your heart rate and causes you to sweat) each week. In addition, most adults need muscle-strengthening exercises on 2 or more days a week.   Maintain a healthy weight. The body mass index (BMI) is a screening tool to identify possible weight problems. It provides an estimate of body fat based on height and weight. Your health care provider can find your BMI and can help you achieve or maintain a healthy weight. For males 20 years and older:  A BMI below 18.5 is considered underweight.  A BMI of 18.5 to 24.9 is normal.  A BMI of 25 to 29.9 is considered overweight.  A BMI of 30 and above is considered obese.  Maintain normal blood lipids and cholesterol by exercising and minimizing your intake of saturated fat. Eat  a balanced diet with plenty of fruits and vegetables. Blood tests for lipids and cholesterol should begin at age 51 and be repeated every 5 years. If your lipid or cholesterol levels are high, you are over age 40, or you are at high risk for heart disease, you may need your cholesterol levels checked more frequently.Ongoing high lipid and cholesterol levels should be treated with medicines if diet and exercise are not working.  If you smoke, find out from your health care provider how to quit. If you do not use tobacco, do not start.  Lung cancer screening is  recommended for adults aged 46-80 years who are at high risk for developing lung cancer because of a history of smoking. A yearly low-dose CT scan of the lungs is recommended for people who have at least a 30-pack-year history of smoking and are current smokers or have quit within the past 15 years. A pack year of smoking is smoking an average of 1 pack of cigarettes a day for 1 year (for example, a 30-pack-year history of smoking could mean smoking 1 pack a day for 30 years or 2 packs a day for 15 years). Yearly screening should continue until the smoker has stopped smoking for at least 15 years. Yearly screening should be stopped for people who develop a health problem that would prevent them from having lung cancer treatment.  If you choose to drink alcohol, do not have more than 2 drinks per day. One drink is considered to be 12 oz (360 mL) of beer, 5 oz (150 mL) of wine, or 1.5 oz (45 mL) of liquor.  Avoid the use of street drugs. Do not share needles with anyone. Ask for help if you need support or instructions about stopping the use of drugs.  High blood pressure causes heart disease and increases the risk of stroke. Blood pressure should be checked at least every 1-2 years. Ongoing high blood pressure should be treated with medicines if weight loss and exercise are not effective.  If you are 48-88 years old, ask your health care provider if you should take aspirin to prevent heart disease.  Diabetes screening involves taking a blood sample to check your fasting blood sugar level. This should be done once every 3 years after age 66 if you are at a normal weight and without risk factors for diabetes. Testing should be considered at a younger age or be carried out more frequently if you are overweight and have at least 1 risk factor for diabetes.  Colorectal cancer can be detected and often prevented. Most routine colorectal cancer screening begins at the age of 56 and continues through age 30.  However, your health care provider may recommend screening at an earlier age if you have risk factors for colon cancer. On a yearly basis, your health care provider may provide home test kits to check for hidden blood in the stool. A small camera at the end of a tube may be used to directly examine the colon (sigmoidoscopy or colonoscopy) to detect the earliest forms of colorectal cancer. Talk to your health care provider about this at age 58 when routine screening begins. A direct exam of the colon should be repeated every 5-10 years through age 103, unless early forms of precancerous polyps or small growths are found.  People who are at an increased risk for hepatitis B should be screened for this virus. You are considered at high risk for hepatitis B if:  You were born  in a country where hepatitis B occurs often. Talk with your health care provider about which countries are considered high risk.  Your parents were born in a high-risk country and you have not received a shot to protect against hepatitis B (hepatitis B vaccine).  You have HIV or AIDS.  You use needles to inject street drugs.  You live with, or have sex with, someone who has hepatitis B.  You are a man who has sex with other men (MSM).  You get hemodialysis treatment.  You take certain medicines for conditions like cancer, organ transplantation, and autoimmune conditions.  Hepatitis C blood testing is recommended for all people born from [birth year] through 1963 and any individual with known risk factors for hepatitis C.  Healthy men should no longer receive prostate-specific antigen (PSA) blood tests as part of routine cancer screening. Talk to your health care provider about prostate cancer screening.  Testicular cancer screening is not recommended for adolescents or adult males who have no symptoms. Screening includes self-exam, a health care provider exam, and other screening tests. Consult with your health care provider about  any symptoms you have or any concerns you have about testicular cancer.  Practice safe sex. Use condoms and avoid high-risk sexual practices to reduce the spread of sexually transmitted infections (STIs).  You should be screened for STIs, including gonorrhea and chlamydia if:  You are sexually active and are younger than 24 years.  You are older than 24 years, and your health care provider tells you that you are at risk for this type of infection.  Your sexual activity has changed since you were last screened, and you are at an increased risk for chlamydia or gonorrhea. Ask your health care provider if you are at risk.  If you are at risk of being infected with HIV, it is recommended that you take a prescription medicine daily to prevent HIV infection. This is called pre-exposure prophylaxis (PrEP). You are considered at risk if:  You are a man who has sex with other men (MSM).  You are a heterosexual man who is sexually active with multiple partners.  You take drugs by injection.  You are sexually active with a partner who has HIV.  Talk with your health care provider about whether you are at high risk of being infected with HIV. If you choose to begin PrEP, you should first be tested for HIV. You should then be tested every 3 months for as long as you are taking PrEP.  Use sunscreen. Apply sunscreen liberally and repeatedly throughout the day. You should seek shade when your shadow is shorter than you. Protect yourself by wearing long sleeves, pants, a wide-brimmed hat, and sunglasses year round whenever you are outdoors.  Tell your health care provider of new moles or changes in moles, especially if there is a change in shape or color. Also, tell your health care provider if a mole is larger than the size of a pencil eraser.  A one-time screening for abdominal aortic aneurysm (AAA) and surgical repair of large AAAs by ultrasound is recommended for men aged 57-75 years who are current  or former smokers.  Stay current with your vaccines (immunizations). Document Released: 04/11/2008 Document Revised: 10/19/2013 Document Reviewed: 03/11/2011 [facility] Patient Information 2015 [place], [place]. This information is not intended to replace advice given to you by your health care provider. Make sure you discuss any questions you have with your health care provider.

## 2014-07-05 ENCOUNTER — Other Ambulatory Visit: Payer: Self-pay | Admitting: *Deleted

## 2014-07-05 MED ORDER — POTASSIUM CHLORIDE CRYS ER 20 MEQ PO TBCR: 20.0000 meq | EXTENDED_RELEASE_TABLET | Freq: Every day | ORAL | Status: DC

## 2014-07-21 ENCOUNTER — Encounter: Payer: Self-pay | Admitting: Internal Medicine

## 2014-08-12 ENCOUNTER — Ambulatory Visit: Payer: BC Managed Care – PPO | Admitting: Internal Medicine

## 2014-08-22 ENCOUNTER — Telehealth: Payer: Self-pay | Admitting: Internal Medicine

## 2014-08-22 MED ORDER — HYDROCODONE-ACETAMINOPHEN 5-325 MG PO TABS: 1.0000 | ORAL_TABLET | Freq: Four times a day (QID) | ORAL | Status: DC | PRN

## 2014-08-22 MED ORDER — MELOXICAM 15 MG PO TABS: 15.0000 mg | ORAL_TABLET | Freq: Every day | ORAL | Status: DC

## 2014-08-22 NOTE — Telephone Encounter (Signed)
Pt request refill  HYDROcodone-acetaminophen (NORCO/VICODIN) 5-325 MG per tablet and  meloxicam (MOBIC) 15 MG tablet [facility]

## 2014-08-22 NOTE — Telephone Encounter (Signed)
Left detailed message Rx ready for pickup and other Rx sent to pharmacy. Rx printed and signed.

## 2014-10-18 ENCOUNTER — Telehealth: Payer: Self-pay | Admitting: Internal Medicine

## 2014-10-18 MED ORDER — HYDROCODONE-ACETAMINOPHEN 5-325 MG PO TABS: 1.0000 | ORAL_TABLET | Freq: Four times a day (QID) | ORAL | Status: DC | PRN

## 2014-10-18 NOTE — Telephone Encounter (Signed)
Pt notified Rx ready for pickup. Rx printed and signed by Dr. Raliegh Ip.

## 2014-10-18 NOTE — Telephone Encounter (Signed)
Pt request refill HYDROcodone-acetaminophen (NORCO/VICODIN) 5-325 MG per tablet °

## 2014-12-01 ENCOUNTER — Telehealth: Payer: Self-pay | Admitting: Internal Medicine

## 2014-12-01 MED ORDER — HYDROCODONE-ACETAMINOPHEN 5-325 MG PO TABS: 1.0000 | ORAL_TABLET | Freq: Four times a day (QID) | ORAL | Status: DC | PRN

## 2014-12-01 NOTE — Telephone Encounter (Signed)
Pt notified Rx ready for pickup. Rx printed and signed.  

## 2014-12-01 NOTE — Telephone Encounter (Signed)
Pt request refill of the following: HYDROcodone-acetaminophen (NORCO/VICODIN) 5-325 MG per tablet ° ° °Phamacy: °

## 2015-01-08 ENCOUNTER — Other Ambulatory Visit: Payer: Self-pay | Admitting: Internal Medicine

## 2015-01-18 ENCOUNTER — Telehealth: Payer: Self-pay | Admitting: Internal Medicine

## 2015-01-18 MED ORDER — HYDROCODONE-ACETAMINOPHEN 5-325 MG PO TABS: 1.0000 | ORAL_TABLET | Freq: Four times a day (QID) | ORAL | Status: DC | PRN

## 2015-01-18 NOTE — Telephone Encounter (Signed)
Pt needs new rx hydrocodone °

## 2015-01-18 NOTE — Telephone Encounter (Signed)
Pt notified Rx ready for pickup. Rx printed and signed.  

## 2015-03-08 ENCOUNTER — Telehealth: Payer: Self-pay | Admitting: Internal Medicine

## 2015-03-08 NOTE — Telephone Encounter (Signed)
Pt request refill HYDROcodone-acetaminophen (NORCO/VICODIN) 5-325 MG per tablet °

## 2015-03-09 MED ORDER — HYDROCODONE-ACETAMINOPHEN 5-325 MG PO TABS: 1.0000 | ORAL_TABLET | Freq: Four times a day (QID) | ORAL | Status: DC | PRN

## 2015-03-09 NOTE — Telephone Encounter (Signed)
Gregory Barrett, please call pt and schedule follow up this week, appointment open tomorrow at 4:00 pm per Dr. Raliegh Ip.

## 2015-03-09 NOTE — Telephone Encounter (Signed)
Pt has been scheduled.  °

## 2015-03-10 ENCOUNTER — Encounter: Payer: Self-pay | Admitting: Internal Medicine

## 2015-03-10 ENCOUNTER — Ambulatory Visit (INDEPENDENT_AMBULATORY_CARE_PROVIDER_SITE_OTHER): Payer: BLUE CROSS/BLUE SHIELD | Admitting: Internal Medicine

## 2015-03-10 VITALS — BP 140/90 | HR 80 | Temp 98.6°F | Resp 20 | Ht 71.0 in | Wt 286.0 lb

## 2015-03-10 DIAGNOSIS — I10 Essential (primary) hypertension: Secondary | ICD-10-CM | POA: Diagnosis not present

## 2015-03-10 DIAGNOSIS — E669 Obesity, unspecified: Secondary | ICD-10-CM

## 2015-03-10 DIAGNOSIS — M25561 Pain in right knee: Secondary | ICD-10-CM | POA: Diagnosis not present

## 2015-03-10 DIAGNOSIS — E6609 Other obesity due to excess calories: Secondary | ICD-10-CM

## 2015-03-10 MED ORDER — MELOXICAM 15 MG PO TABS
15.0000 mg | ORAL_TABLET | Freq: Every day | ORAL | Status: DC
Start: 2015-03-10 — End: 2016-02-08

## 2015-03-10 MED ORDER — LISINOPRIL-HYDROCHLOROTHIAZIDE 20-25 MG PO TABS: 1.0000 | ORAL_TABLET | Freq: Every day | ORAL | Status: DC

## 2015-03-10 MED ORDER — POTASSIUM CHLORIDE CRYS ER 20 MEQ PO TBCR: 20.0000 meq | EXTENDED_RELEASE_TABLET | Freq: Every day | ORAL | Status: DC

## 2015-03-10 MED ORDER — AMLODIPINE BESYLATE 5 MG PO TABS: 5.0000 mg | ORAL_TABLET | Freq: Every day | ORAL | Status: DC

## 2015-03-10 NOTE — Progress Notes (Signed)
Pre visit review using our clinic review tool, if applicable. No additional management support is needed unless otherwise documented below in the visit note. 

## 2015-03-10 NOTE — Progress Notes (Signed)
Subjective:    Patient ID: Gregory Barrett, male    DOB: 1971-06-02, 44 y.o.   MRN: [id number]  HPI  BP Readings from Last 3 Encounters:  03/10/15 140/90  07/01/14 160/90  05/09/14 39/26   44 year old patient who is seen today for follow-up of hypertension.  No recent blood pressure monitoring.  He seems to think that when he was at another job.  The on-site nurse was getting readings in the 150 over 90 range He has chronic right knee pain.  He states that he is considering surgery with Dr. Marcelino Scot.  He has been instructed to lose weight.  Remains on chronic analgesics.  He also is on meloxicam.  Wt Readings from Last 3 Encounters:  03/10/15 286 lb (129.729 kg)  07/01/14 290 lb (131.543 kg)  05/09/14 296 lb (134.265 kg)    Past Medical History  Diagnosis Date  . Migraine     History   Social History  . Marital Status: Married    Spouse Name: N/A  . Number of Children: N/A  . Years of Education: N/A   Occupational History  . Not on file.   Social History Main Topics  . Smoking status: Former Research scientist (life sciences)  . Smokeless tobacco: Current User    Types: Chew  . Alcohol Use: Yes  . Drug Use: No  . Sexual Activity: Not on file   Other Topics Concern  . Not on file   Social History Narrative    Past Surgical History  Procedure Laterality Date  . Knee surgery      right  . Tibia fracture surgery      right  . Vasectomy      Family History  Problem Relation Age of Onset  . Hypertension Maternal Grandmother   . Cancer Maternal Grandfather     lung ca    No Known Allergies  Current Outpatient Prescriptions on File Prior to Visit  Medication Sig Dispense Refill  . HYDROcodone-acetaminophen (NORCO/VICODIN) 5-325 MG per tablet Take 1 tablet by mouth every 6 (six) hours as needed. 90 tablet 0   No current facility-administered medications on file prior to visit.    BP 140/90 mmHg  Pulse 80  Temp(Src) 98.6 F (37 C) (Oral)  Resp 20  Ht 5\' 11"  (1.803 m)  Wt  286 lb (129.729 kg)  BMI 39.91 kg/m2  SpO2 97%    Review of Systems  Constitutional: Negative for fever, chills, appetite change and fatigue.  HENT: Negative for congestion, dental problem, ear pain, hearing loss, sore throat, tinnitus, trouble swallowing and voice change.   Eyes: Negative for pain, discharge and visual disturbance.  Respiratory: Negative for cough, chest tightness, wheezing and stridor.   Cardiovascular: Negative for chest pain, palpitations and leg swelling.  Gastrointestinal: Negative for nausea, vomiting, abdominal pain, diarrhea, constipation, blood in stool and abdominal distention.  Genitourinary: Negative for urgency, hematuria, flank pain, discharge, difficulty urinating and genital sores.  Musculoskeletal: Positive for joint swelling. Negative for myalgias, back pain, arthralgias, gait problem and neck stiffness.       Chronic right knee pain  Skin: Negative for rash.  Neurological: Negative for dizziness, syncope, speech difficulty, weakness, numbness and headaches.  Hematological: Negative for adenopathy. Does not bruise/bleed easily.  Psychiatric/Behavioral: Negative for behavioral problems and dysphoric mood. The patient is not nervous/anxious.        Objective:   Physical Exam  Constitutional: He is oriented to person, place, and time. He appears well-developed.  Blood pressure on  arrival 140 over 90 Blood pressure as high as 160 over 100 Weight 286  HENT:  Head: Normocephalic.  Right Ear: External ear normal.  Left Ear: External ear normal.  Eyes: Conjunctivae and EOM are normal.  Neck: Normal range of motion.  Cardiovascular: Normal rate and normal heart sounds.   Pulmonary/Chest: Breath sounds normal.  Abdominal: Bowel sounds are normal.  Musculoskeletal: Normal range of motion. He exhibits no edema or tenderness.  Right knee in a brace  Neurological: He is alert and oriented to person, place, and time.  Psychiatric: He has a normal mood and  affect. His behavior is normal.          Assessment & Plan:  Exogenous obesity.  Will continue his efforts at weight loss Hypertension.  Suboptimal control.  Will add amlodipine 5.  Home blood pressure monitoring recommended.  Low-salt diet, exercise, weight loss.  All encouraged Chronic right knee pain.  Vicodin refill.  Follow-up orthopedics.  Patient considering surgery  Recheck 2 months

## 2015-03-10 NOTE — Patient Instructions (Signed)
Limit your sodium (Salt) intake  Please check your blood pressure on a regular basis.  If it is consistently greater than 150/90, please make an office appointment.  You need to lose weight.  Consider a lower calorie diet and regular exercise.    It is important that you exercise regularly, at least 20 minutes 3 to 4 times per week.  If you develop chest pain or shortness of breath seek  medical attention.  DASH Eating Plan DASH stands for "Dietary Approaches to Stop Hypertension." The DASH eating plan is a healthy eating plan that has been shown to reduce high blood pressure (hypertension). Additional health benefits may include reducing the risk of type 2 diabetes mellitus, heart disease, and stroke. The DASH eating plan may also help with weight loss. WHAT DO I NEED TO KNOW ABOUT THE DASH EATING PLAN? For the DASH eating plan, you will follow these general guidelines:  Choose foods with a percent daily value for sodium of less than 5% (as listed on the food label).  Use salt-free seasonings or herbs instead of table salt or sea salt.  Check with your health care provider or pharmacist before using salt substitutes.  Eat lower-sodium products, often labeled as "lower sodium" or "no salt added."  Eat fresh foods.  Eat more vegetables, fruits, and low-fat dairy products.  Choose whole grains. Look for the word "whole" as the first word in the ingredient list.  Choose fish and skinless chicken or [place] more often than red meat. Limit fish, poultry, and meat to 6 oz (170 g) each day.  Limit sweets, desserts, sugars, and sugary drinks.  Choose heart-healthy fats.  Limit cheese to 1 oz (28 g) per day.  Eat more home-cooked food and less restaurant, buffet, and fast food.  Limit fried foods.  Cook foods using methods other than frying.  Limit canned vegetables. If you do use them, rinse them well to decrease the sodium.  When eating at a restaurant, ask that your food be  prepared with less salt, or no salt if possible. WHAT FOODS CAN I EAT? Seek help from a dietitian for individual calorie needs. Grains Whole grain or whole wheat bread. Brown rice. Whole grain or whole wheat pasta. Quinoa, bulgur, and whole grain cereals. Low-sodium cereals. Corn or whole wheat flour tortillas. Whole grain cornbread. Whole grain crackers. Low-sodium crackers. Vegetables Fresh or frozen vegetables (raw, steamed, roasted, or grilled). Low-sodium or reduced-sodium tomato and vegetable juices. Low-sodium or reduced-sodium tomato sauce and paste. Low-sodium or reduced-sodium canned vegetables.  Fruits All fresh, canned (in natural juice), or frozen fruits. Meat and Other Protein Products Ground beef (85% or leaner), grass-fed beef, or beef trimmed of fat. Skinless chicken or [place]. Ground chicken or [place]. Pork trimmed of fat. All fish and seafood. Eggs. Dried beans, peas, or lentils. Unsalted nuts and seeds. Unsalted canned beans. Dairy Low-fat dairy products, such as skim or 1% milk, 2% or reduced-fat cheeses, low-fat ricotta or cottage cheese, or plain low-fat yogurt. Low-sodium or reduced-sodium cheeses. Fats and Oils Tub margarines without trans fats. Light or reduced-fat mayonnaise and salad dressings (reduced sodium). Avocado. Safflower, olive, or canola oils. Natural peanut or almond butter. Other Unsalted popcorn and pretzels. The items listed above may not be a complete list of recommended foods or beverages. Contact your dietitian for more options. WHAT FOODS ARE NOT RECOMMENDED? Grains White bread. White pasta. White rice. Refined cornbread. Bagels and croissants. Crackers that contain trans fat. Vegetables Creamed or fried vegetables. Vegetables  in a cheese sauce. Regular canned vegetables. Regular canned tomato sauce and paste. Regular tomato and vegetable juices. Fruits Dried fruits. Canned fruit in light or heavy syrup. Fruit juice. Meat and Other Protein  Products Fatty cuts of meat. Ribs, chicken wings, bacon, sausage, bologna, salami, chitterlings, fatback, hot dogs, bratwurst, and packaged luncheon meats. Salted nuts and seeds. Canned beans with salt. Dairy Whole or 2% milk, cream, half-and-half, and cream cheese. Whole-fat or sweetened yogurt. Full-fat cheeses or blue cheese. Nondairy creamers and whipped toppings. Processed cheese, cheese spreads, or cheese curds. Condiments Onion and garlic salt, seasoned salt, table salt, and sea salt. Canned and packaged gravies. Worcestershire sauce. Tartar sauce. Barbecue sauce. Teriyaki sauce. Soy sauce, including reduced sodium. Steak sauce. Fish sauce. Oyster sauce. Cocktail sauce. Horseradish. Ketchup and mustard. Meat flavorings and tenderizers. Bouillon cubes. Hot sauce. Tabasco sauce. Marinades. Taco seasonings. Relishes. Fats and Oils Butter, stick margarine, lard, shortening, ghee, and bacon fat. Coconut, palm kernel, or palm oils. Regular salad dressings. Other Pickles and olives. Salted popcorn and pretzels. The items listed above may not be a complete list of foods and beverages to avoid. Contact your dietitian for more information. WHERE CAN I FIND MORE INFORMATION? National Heart, Lung, and Blood Institute: travelstabloid.com Document Released: 10/03/2011 Document Revised: 02/28/2014 Document Reviewed: 08/18/2013 [facility] Patient Information 2015 [place], [place]. This information is not intended to replace advice given to you by your health care provider. Make sure you discuss any questions you have with your health care provider.

## 2015-04-24 ENCOUNTER — Telehealth: Payer: Self-pay | Admitting: Internal Medicine

## 2015-04-24 NOTE — Telephone Encounter (Signed)
Pt needs new rx hydrocodone °

## 2015-04-25 MED ORDER — HYDROCODONE-ACETAMINOPHEN 5-325 MG PO TABS: 1.0000 | ORAL_TABLET | Freq: Four times a day (QID) | ORAL | Status: DC | PRN

## 2015-04-25 NOTE — Telephone Encounter (Signed)
Pt notified Rx ready for pickup. Rx printed and signed.  

## 2015-06-01 ENCOUNTER — Telehealth: Payer: Self-pay | Admitting: Internal Medicine

## 2015-06-01 MED ORDER — HYDROCODONE-ACETAMINOPHEN 5-325 MG PO TABS: 1.0000 | ORAL_TABLET | Freq: Four times a day (QID) | ORAL | Status: DC | PRN

## 2015-06-01 NOTE — Telephone Encounter (Signed)
Pt needs new rx hydrocodone °

## 2015-06-01 NOTE — Telephone Encounter (Signed)
30

## 2015-06-01 NOTE — Telephone Encounter (Signed)
Left message on machine for patient that rx is ready for pick up

## 2015-06-13 ENCOUNTER — Ambulatory Visit (INDEPENDENT_AMBULATORY_CARE_PROVIDER_SITE_OTHER): Payer: BLUE CROSS/BLUE SHIELD | Admitting: Internal Medicine

## 2015-06-13 ENCOUNTER — Encounter: Payer: Self-pay | Admitting: Internal Medicine

## 2015-06-13 VITALS — BP 138/100 | HR 90 | Temp 98.3°F | Resp 20 | Ht 71.0 in | Wt 293.0 lb

## 2015-06-13 DIAGNOSIS — E669 Obesity, unspecified: Secondary | ICD-10-CM

## 2015-06-13 DIAGNOSIS — M25561 Pain in right knee: Secondary | ICD-10-CM

## 2015-06-13 DIAGNOSIS — I10 Essential (primary) hypertension: Secondary | ICD-10-CM

## 2015-06-13 DIAGNOSIS — E6609 Other obesity due to excess calories: Secondary | ICD-10-CM

## 2015-06-13 MED ORDER — HYDROCODONE-ACETAMINOPHEN 5-325 MG PO TABS: 1.0000 | ORAL_TABLET | Freq: Four times a day (QID) | ORAL | Status: DC | PRN

## 2015-06-13 NOTE — Patient Instructions (Signed)
Limit your sodium (Salt) intake  You need to lose weight.  Consider a lower calorie diet and regular exercise.  Please check your blood pressure on a regular basis.  If it is consistently greater than 150/90, please make an office appointment.

## 2015-06-13 NOTE — Progress Notes (Signed)
Subjective:    Patient ID: Gregory Barrett, male    DOB: 1971-08-21, 44 y.o.   MRN: [id number]  HPI  Wt Readings from Last 3 Encounters:  06/13/15 293 lb (132.904 kg)  03/10/15 286 lb (129.729 kg)  07/01/14 290 lb (131.543 kg)   BP Readings from Last 3 Encounters:  06/13/15 138/100  03/10/15 140/90  07/01/14 10/69   44 year old patient who has treated hypertension.  Remains on triple therapy.  He does monitor blood pressures occasionally at work usually normal, but occasionally mildly elevated. He continues to have chronic knee pain.  He remains on hydrocodone twice daily.  He states that he is planning on surgery after the first the year when he builds up sufficient vacation time.  Apparently Dr. Marcelino Scot plans on doing a two-stage procedure.  The hardware will need to be removed followed by a second total knee replacement surgery  Past Medical History  Diagnosis Date  . Migraine     Social History   Social History  . Marital Status: Married    Spouse Name: N/A  . Number of Children: N/A  . Years of Education: N/A   Occupational History  . Not on file.   Social History Main Topics  . Smoking status: Former Research scientist (life sciences)  . Smokeless tobacco: Current User    Types: Chew  . Alcohol Use: Yes  . Drug Use: No  . Sexual Activity: Not on file   Other Topics Concern  . Not on file   Social History Narrative    Past Surgical History  Procedure Laterality Date  . Knee surgery      right  . Tibia fracture surgery      right  . Vasectomy      Family History  Problem Relation Age of Onset  . Hypertension Maternal Grandmother   . Cancer Maternal Grandfather     lung ca    No Known Allergies  Current Outpatient Prescriptions on File Prior to Visit  Medication Sig Dispense Refill  . amLODipine (NORVASC) 5 MG tablet Take 1 tablet (5 mg total) by mouth daily. 90 tablet 3  . esomeprazole (NEXIUM) 20 MG capsule Take 20 mg by mouth daily at 12 noon. OTC    .  HYDROcodone-acetaminophen (NORCO/VICODIN) 5-325 MG per tablet Take 1 tablet by mouth every 6 (six) hours as needed. 30 tablet 0  . lisinopril-hydrochlorothiazide (PRINZIDE,ZESTORETIC) 20-25 MG per tablet Take 1 tablet by mouth daily. 90 tablet 3  . meloxicam (MOBIC) 15 MG tablet Take 1 tablet (15 mg total) by mouth daily. 90 tablet 3  . potassium chloride SA (K-DUR,KLOR-CON) 20 MEQ tablet Take 1 tablet (20 mEq total) by mouth daily. 90 tablet 1   No current facility-administered medications on file prior to visit.    BP 138/100 mmHg  Pulse 90  Temp(Src) 98.3 F (36.8 C) (Oral)  Resp 20  Ht 5\' 11"  (1.803 m)  Wt 293 lb (132.904 kg)  BMI 40.88 kg/m2  SpO2 98%      Review of Systems  Constitutional: Negative for fever, chills, appetite change and fatigue.  HENT: Negative for congestion, dental problem, ear pain, hearing loss, sore throat, tinnitus, trouble swallowing and voice change.   Eyes: Negative for pain, discharge and visual disturbance.  Respiratory: Negative for cough, chest tightness, wheezing and stridor.   Cardiovascular: Negative for chest pain, palpitations and leg swelling.  Gastrointestinal: Negative for nausea, vomiting, abdominal pain, diarrhea, constipation, blood in stool and abdominal distention.  Genitourinary: Negative  for urgency, hematuria, flank pain, discharge, difficulty urinating and genital sores.  Musculoskeletal: Negative for myalgias, back pain, joint swelling, arthralgias, gait problem and neck stiffness.       Chronic right knee pain  Skin: Negative for rash.  Neurological: Negative for dizziness, syncope, speech difficulty, weakness, numbness and headaches.  Hematological: Negative for adenopathy. Does not bruise/bleed easily.  Psychiatric/Behavioral: Negative for behavioral problems and dysphoric mood. The patient is not nervous/anxious.        Objective:   Physical Exam  Constitutional: He is oriented to person, place, and time. He appears  well-developed.  HENT:  Head: Normocephalic.  Right Ear: External ear normal.  Left Ear: External ear normal.  Eyes: Conjunctivae and EOM are normal.  Neck: Normal range of motion.  Cardiovascular: Normal rate and normal heart sounds.   Pulmonary/Chest: Breath sounds normal.  Abdominal: Bowel sounds are normal.  Musculoskeletal: Normal range of motion. He exhibits no edema or tenderness.  Right knee in a brace  Neurological: He is alert and oriented to person, place, and time.  Psychiatric: He has a normal mood and affect. His behavior is normal.          Assessment & Plan:   Hypertension.  Unclear control.  Asked the patient to check blood pressures more closely.  Will notify the office if blood pressure is consistently greater than 140 over 90 Obesity.  Weight loss encouraged Chronic right knee pain.  Hydrocodone refilled

## 2015-06-13 NOTE — Progress Notes (Signed)
Pre visit review using our clinic review tool, if applicable. No additional management support is needed unless otherwise documented below in the visit note. 

## 2015-07-18 ENCOUNTER — Telehealth: Payer: Self-pay | Admitting: Internal Medicine

## 2015-07-18 MED ORDER — HYDROCODONE-ACETAMINOPHEN 5-325 MG PO TABS: 1.0000 | ORAL_TABLET | Freq: Four times a day (QID) | ORAL | Status: DC | PRN

## 2015-07-18 NOTE — Telephone Encounter (Signed)
Pt notified Rx ready for pickup. Rx printed and signed.  

## 2015-07-18 NOTE — Telephone Encounter (Signed)
Pt needs new rx hydrocodone °

## 2015-08-24 ENCOUNTER — Telehealth: Payer: Self-pay | Admitting: Internal Medicine

## 2015-08-24 NOTE — Telephone Encounter (Signed)
Pt request refill HYDROcodone-acetaminophen (NORCO/VICODIN) 5-325 MG per tablet °

## 2015-08-25 MED ORDER — HYDROCODONE-ACETAMINOPHEN 5-325 MG PO TABS: 1.0000 | ORAL_TABLET | Freq: Four times a day (QID) | ORAL | Status: DC | PRN

## 2015-08-25 NOTE — Telephone Encounter (Signed)
Pt notified Rx ready for pickup. Rx printed and signed.  

## 2015-08-31 ENCOUNTER — Ambulatory Visit: Payer: BLUE CROSS/BLUE SHIELD | Admitting: Internal Medicine

## 2015-09-29 ENCOUNTER — Telehealth: Payer: Self-pay | Admitting: Internal Medicine

## 2015-09-29 MED ORDER — HYDROCODONE-ACETAMINOPHEN 5-325 MG PO TABS: 1.0000 | ORAL_TABLET | Freq: Four times a day (QID) | ORAL | Status: DC | PRN

## 2015-09-29 NOTE — Telephone Encounter (Signed)
Pt notified Rx ready for pickup. Rx printed and signed.  

## 2015-09-29 NOTE — Telephone Encounter (Signed)
Pt called saying he needs a Rx for Norco. Please call the pt if you have questions or concerns.   Pt ph# [phone] Thank you.

## 2015-10-06 ENCOUNTER — Ambulatory Visit: Payer: BLUE CROSS/BLUE SHIELD | Admitting: Internal Medicine

## 2015-11-07 ENCOUNTER — Telehealth: Payer: Self-pay | Admitting: Internal Medicine

## 2015-11-07 MED ORDER — HYDROCODONE-ACETAMINOPHEN 5-325 MG PO TABS: 1.0000 | ORAL_TABLET | Freq: Four times a day (QID) | ORAL | Status: DC | PRN

## 2015-11-07 NOTE — Telephone Encounter (Signed)
Pt requesting refill of HYDROcodone-acetaminophen (NORCO/VICODIN) 5-325 MG tablet

## 2015-11-07 NOTE — Telephone Encounter (Signed)
Left message on voicemail Rx ready for pickup will be at the front desk. Rx printed and signed.  

## 2015-12-11 ENCOUNTER — Other Ambulatory Visit: Payer: Self-pay

## 2015-12-11 ENCOUNTER — Encounter (HOSPITAL_COMMUNITY): Payer: Self-pay

## 2015-12-11 ENCOUNTER — Encounter (HOSPITAL_COMMUNITY)
Admission: RE | Admit: 2015-12-11 | Discharge: 2015-12-11 | Disposition: A | Discharge status: Home / Self Care | Payer: BLUE CROSS/BLUE SHIELD | Source: Ambulatory Visit | Attending: Orthopedic Surgery | Admitting: Orthopedic Surgery

## 2015-12-11 DIAGNOSIS — F17220 Nicotine dependence, chewing tobacco, uncomplicated: Secondary | ICD-10-CM | POA: Diagnosis not present

## 2015-12-11 DIAGNOSIS — T8484XA Pain due to internal orthopedic prosthetic devices, implants and grafts, initial encounter: Secondary | ICD-10-CM | POA: Diagnosis not present

## 2015-12-11 DIAGNOSIS — I10 Essential (primary) hypertension: Secondary | ICD-10-CM | POA: Diagnosis not present

## 2015-12-11 DIAGNOSIS — M1711 Unilateral primary osteoarthritis, right knee: Secondary | ICD-10-CM | POA: Diagnosis not present

## 2015-12-11 DIAGNOSIS — Z6841 Body Mass Index (BMI) 40.0 and over, adult: Secondary | ICD-10-CM | POA: Diagnosis not present

## 2015-12-11 DIAGNOSIS — Y838 Other surgical procedures as the cause of abnormal reaction of the patient, or of later complication, without mention of misadventure at the time of the procedure: Secondary | ICD-10-CM | POA: Diagnosis not present

## 2015-12-11 HISTORY — DX: Gastro-esophageal reflux disease without esophagitis: K21.9

## 2015-12-11 HISTORY — DX: Essential (primary) hypertension: I10

## 2015-12-11 HISTORY — DX: Unspecified osteoarthritis, unspecified site: M19.90

## 2015-12-11 LAB — URINALYSIS, ROUTINE W REFLEX MICROSCOPIC
BILIRUBIN URINE: NEGATIVE
GLUCOSE, UA: NEGATIVE mg/dL
Hgb urine dipstick: NEGATIVE
KETONES UR: NEGATIVE mg/dL
Leukocytes, UA: NEGATIVE
Nitrite: NEGATIVE
PH: 7.5 (ref 5.0–8.0)
Protein, ur: NEGATIVE mg/dL
Specific Gravity, Urine: 1.017 (ref 1.005–1.030)

## 2015-12-11 LAB — CBC WITH DIFFERENTIAL/PLATELET
BASOS ABS: 0.1 10*3/uL (ref 0.0–0.1)
Basophils Relative: 1 %
Eosinophils Absolute: 0.3 10*3/uL (ref 0.0–0.7)
Eosinophils Relative: 4 %
HEMATOCRIT: 42.9 % (ref 39.0–52.0)
Hemoglobin: 15 g/dL (ref 13.0–17.0)
LYMPHS PCT: 32 %
Lymphs Abs: 2.2 10*3/uL (ref 0.7–4.0)
MCH: 30.6 pg (ref 26.0–34.0)
MCHC: 35 g/dL (ref 30.0–36.0)
MCV: 87.6 fL (ref 78.0–100.0)
MONO ABS: 0.8 10*3/uL (ref 0.1–1.0)
Monocytes Relative: 11 %
NEUTROS ABS: 3.8 10*3/uL (ref 1.7–7.7)
Neutrophils Relative %: 54 %
Platelets: 283 10*3/uL (ref 150–400)
RBC: 4.9 MIL/uL (ref 4.22–5.81)
RDW: 13.2 % (ref 11.5–15.5)
WBC: 7.1 10*3/uL (ref 4.0–10.5)

## 2015-12-11 LAB — COMPREHENSIVE METABOLIC PANEL
ALK PHOS: 61 U/L (ref 38–126)
ALT: 34 U/L (ref 17–63)
AST: 26 U/L (ref 15–41)
Albumin: 4 g/dL (ref 3.5–5.0)
Anion gap: 11 (ref 5–15)
BILIRUBIN TOTAL: 0.5 mg/dL (ref 0.3–1.2)
BUN: 18 mg/dL (ref 6–20)
CALCIUM: 8.8 mg/dL — AB (ref 8.9–10.3)
CO2: 28 mmol/L (ref 22–32)
CREATININE: 1.07 mg/dL (ref 0.61–1.24)
Chloride: 102 mmol/L (ref 101–111)
GFR calc Af Amer: >60 mL/min (ref >60)
Glucose, Bld: 99 mg/dL (ref 65–99)
Potassium: 3.1 mmol/L — ABNORMAL LOW (ref 3.5–5.1)
Sodium: 141 mmol/L (ref 135–145)
TOTAL PROTEIN: 6.9 g/dL (ref 6.5–8.1)

## 2015-12-11 LAB — TYPE AND SCREEN
ABO/RH(D): O POS
Antibody Screen: NEGATIVE

## 2015-12-11 LAB — PROTIME-INR
INR: 0.99 (ref 0.00–1.49)
PROTHROMBIN TIME: 13.3 s (ref 11.6–15.2)

## 2015-12-11 LAB — ABO/RH: ABO/RH(D): O POS

## 2015-12-11 LAB — APTT: APTT: 32 s (ref 24–37)

## 2015-12-11 MED ORDER — LACTATED RINGERS IV SOLN
INTRAVENOUS | Status: DC
  Administered 2015-12-12: 07:00:00 via INTRAVENOUS

## 2015-12-11 MED ORDER — CHLORHEXIDINE GLUCONATE 4 % EX LIQD: 60.0000 mL | Freq: Once | CUTANEOUS | Status: DC

## 2015-12-11 MED ORDER — DEXTROSE 5 % IV SOLN
3.0000 g | INTRAVENOUS | Status: AC
  Administered 2015-12-12: 3 g via INTRAVENOUS
  Filled 2015-12-11: qty 3000

## 2015-12-11 NOTE — Anesthesia Preprocedure Evaluation (Addendum)
Anesthesia Evaluation  Patient identified by MRN, date of birth, ID band Patient awake    Reviewed: Allergy & Precautions, NPO status , Patient's Chart, lab work & pertinent test results  History of Anesthesia Complications Negative for: history of anesthetic complications  Airway Mallampati: III  TM Distance: >3 FB Neck ROM: Full    Dental  (+) Teeth Intact, Dental Advisory Given   Pulmonary former smoker,    Pulmonary exam normal        Cardiovascular hypertension, Normal cardiovascular exam     Neuro/Psych  Headaches, negative psych ROS   GI/Hepatic Neg liver ROS, GERD  ,  Endo/Other  Morbid obesity  Renal/GU negative Renal ROS     Musculoskeletal   Abdominal   Peds  Hematology negative hematology ROS (+)   Anesthesia Other Findings   Reproductive/Obstetrics                            Anesthesia Physical Anesthesia Plan  ASA: II  Anesthesia Plan: General   Post-op Pain Management:    Induction: Intravenous  Airway Management Planned: Oral ETT  Additional Equipment:   Intra-op Plan:   Post-operative Plan: Extubation in OR  Informed Consent: I have reviewed the patients History and Physical, chart, labs and discussed the procedure including the risks, benefits and alternatives for the proposed anesthesia with the patient or authorized representative who has indicated his/her understanding and acceptance.   Dental advisory given  Plan Discussed with: Anesthesiologist, Surgeon and CRNA  Anesthesia Plan Comments:         Anesthesia Quick Evaluation

## 2015-12-11 NOTE — H&P (Signed)
[facility] H&P  Chief Complaint: retained Hardware R proximal tibia, endstage post-traumatic DJD R knee HPI:   45 y/o male s/p ORIF R tibial plateau malunion July of 2006. Pt has had increasing R knee pain as well as a varus knee who presents today for staged procedure for R TKA. Pt presents for [facility] R proximal tibia   Past Medical History  Diagnosis Date  . Migraine   . Hypertension   . GERD (gastroesophageal reflux disease)   . Arthritis     legs, back     Past Surgical History  Procedure Laterality Date  . Knee surgery      right  . Tibia fracture surgery      right  . Vasectomy      Family History  Problem Relation Age of Onset  . Hypertension Maternal Grandmother   . Cancer Maternal Grandfather     lung ca   Social History:  reports that he has quit smoking. His smokeless tobacco use includes Chew. He reports that he drinks alcohol. He reports that he does not use illicit drugs.  Allergies: No Known Allergies  No prescriptions prior to admission    Results for orders placed or performed during the hospital encounter of 12/11/15 (from the past 48 hour(s))  APTT     Status: None   Collection Time: 12/11/15  9:07 AM  Result Value Ref Range   aPTT 32 24 - 37 seconds  CBC WITH DIFFERENTIAL     Status: None   Collection Time: 12/11/15  9:07 AM  Result Value Ref Range   WBC 7.1 4.0 - 10.5 K/uL   RBC 4.90 4.22 - 5.81 MIL/uL   Hemoglobin 15.0 13.0 - 17.0 g/dL   HCT 42.9 39.0 - 52.0 %   MCV 87.6 78.0 - 100.0 fL   MCH 30.6 26.0 - 34.0 pg   MCHC 35.0 30.0 - 36.0 g/dL   RDW 13.2 11.5 - 15.5 %   Platelets 283 150 - 400 K/uL   Neutrophils Relative % 54 %   Neutro Abs 3.8 1.7 - 7.7 K/uL   Lymphocytes Relative 32 %   Lymphs Abs 2.2 0.7 - 4.0 K/uL   Monocytes Relative 11 %   Monocytes Absolute 0.8 0.1 - 1.0 K/uL   Eosinophils Relative 4 %   Eosinophils Absolute 0.3 0.0 - 0.7 K/uL   Basophils Relative 1 %   Basophils Absolute 0.1 0.0 - 0.1 K/uL   Comprehensive metabolic panel     Status: Abnormal   Collection Time: 12/11/15  9:07 AM  Result Value Ref Range   Sodium 141 135 - 145 mmol/L   Potassium 3.1 (L) 3.5 - 5.1 mmol/L   Chloride 102 101 - 111 mmol/L   CO2 28 22 - 32 mmol/L   Glucose, Bld 99 65 - 99 mg/dL   BUN 18 6 - 20 mg/dL   Creatinine, Ser 1.07 0.61 - 1.24 mg/dL   Calcium 8.8 (L) 8.9 - 10.3 mg/dL   Total Protein 6.9 6.5 - 8.1 g/dL   Albumin 4.0 3.5 - 5.0 g/dL   AST 26 15 - 41 U/L   ALT 34 17 - 63 U/L   Alkaline Phosphatase 61 38 - 126 U/L   Total Bilirubin 0.5 0.3 - 1.2 mg/dL   GFR calc non Af Amer >60 >60 mL/min   GFR calc Af Amer >60 >60 mL/min    Comment: (NOTE) The eGFR has been calculated using the CKD EPI equation. This calculation  has not been validated in all clinical situations. eGFR's persistently <60 mL/min signify possible Chronic Kidney Disease.    Anion gap 11 5 - 15  Protime-INR     Status: None   Collection Time: 12/11/15  9:07 AM  Result Value Ref Range   Prothrombin Time 13.3 11.6 - 15.2 seconds   INR 0.99 0.00 - 1.49  Urinalysis, Routine w reflex microscopic (not at [facility])     Status: None   Collection Time: 12/11/15  9:07 AM  Result Value Ref Range   Color, Urine YELLOW YELLOW   APPearance CLEAR CLEAR   Specific Gravity, Urine 1.017 1.005 - 1.030   pH 7.5 5.0 - 8.0   Glucose, UA NEGATIVE NEGATIVE mg/dL   Hgb urine dipstick NEGATIVE NEGATIVE   Bilirubin Urine NEGATIVE NEGATIVE   Ketones, ur NEGATIVE NEGATIVE mg/dL   Protein, ur NEGATIVE NEGATIVE mg/dL   Nitrite NEGATIVE NEGATIVE   Leukocytes, UA NEGATIVE NEGATIVE    Comment: MICROSCOPIC NOT DONE ON URINES WITH NEGATIVE PROTEIN, BLOOD, LEUKOCYTES, NITRITE, OR GLUCOSE <1000 mg/dL.  Type and screen [place]     Status: None   Collection Time: 12/11/15  9:30 AM  Result Value Ref Range   ABO/RH(D) O POS    Antibody Screen NEG    Sample Expiration 12/14/2015   ABO/Rh     Status: None   Collection Time: 12/11/15   9:30 AM  Result Value Ref Range   ABO/RH(D) O POS    No results found.  Review of Systems  Constitutional: Negative for fever and chills.  Respiratory: Negative for shortness of breath and wheezing.   Cardiovascular: Negative for chest pain and palpitations.  Gastrointestinal: Negative for nausea and vomiting.  Genitourinary: Negative for dysuria.  Musculoskeletal: Positive for joint pain (R knee pain ).  Neurological: Negative for headaches.    Vitals to be obtained on arrival to short stay  Physical Exam  Constitutional: He is oriented to person, place, and time. He appears well-developed and well-nourished.  HENT:  Head: Normocephalic and atraumatic.  Cardiovascular: Normal rate and regular rhythm.   Respiratory: Effort normal and breath sounds normal.  GI: Soft. Bowel sounds are normal.  Musculoskeletal:  Right Lower  Extremity    Healed surgical wounds   Motor and sensory functions intact   Ext warm    + dp pulse   Varus knee    Skin stable   Neurological: He is alert and oriented to person, place, and time.  Skin: Skin is warm and dry.  Psychiatric: He has a normal mood and affect. His behavior is normal.    xrays   Office films show endstage post-traumatic djd of R knee with varus alignment   Assessment/Plan  45 y/o male with endstage post-traumatic DJD R knee with retained proximal tibial hardware  OR for [facility] so pt can proceed with TKA Likely outpt procedure vs overnight stay Increased risk for complications due to posterior approach  Pt understands risks and wishes to proceed  Jari Pigg, PA-C [facility] [phone] (P) 12/11/2015, 8:02 PM

## 2015-12-11 NOTE — Pre-Procedure Instructions (Signed)
Gregory Barrett  12/11/2015      [id number] - [place], [place] - [address] [place] [address] Phone: [phone] Fax: [phone]    Your procedure is scheduled on Tuesday, December 12, 2015  Report to [facility] Admitting at 6:00 A.M.  Call this number if you have problems the morning of surgery:  [phone]   Remember:  Do not eat food or drink liquids after midnight.  Take these medicines the morning of surgery with A SIP OF WATER : amLODipine (NORVASC), Nexium If needed: HYDROcodone-acetaminophen (NORCO/VICODIN) for pain  Stop taking Aspirin, vitamins, fish oil and herbal medications. Do not take any NSAIDs ie: Ibuprofen, Advil, Naproxen, BC and Goody Powder or any medication containing Aspirin such as meloxicam (MOBIC); stop now  Do not wear jewelry   Do not wear lotions, powders, or perfumes.  You may not wear deodorant.              Men may shave face and neck.   Do not bring valuables to the hospital.   [facility] is not responsible for any belongings or valuables.  Contacts, dentures or bridgework may not be worn into surgery.  Leave your suitcase in the car.  After surgery it may be brought to your room.  For patients admitted to the hospital, discharge time will be determined by your treatment team.  Patients discharged the day of surgery will not be allowed to drive home.   Name and phone number of your driver:  Special instructions:     Special Instructions:Special Instructions: [facility] - Preparing for Surgery  Before surgery, you can play an important role.  Because skin is not sterile, your skin needs to be as free of germs as possible.  You can reduce the number of germs on you skin by washing with CHG (chlorahexidine gluconate) soap before surgery.  CHG is an antiseptic cleaner which kills germs and bonds with the skin to continue killing germs even after washing.  Please DO NOT use if you  have an allergy to CHG or antibacterial soaps.  If your skin becomes reddened/irritated stop using the CHG and inform your nurse when you arrive at Short Stay.  Do not shave (including legs and underarms) for at least 48 hours prior to the first CHG shower.  You may shave your face.  Please follow these instructions carefully:   1.  Shower with CHG Soap the night before surgery and the morning of Surgery.  2.  If you choose to wash your hair, wash your hair first as usual with your normal shampoo.  3.  After you shampoo, rinse your hair and body thoroughly to remove the Shampoo.  4.  Use CHG as you would any other liquid soap.  You can apply chg directly  to the skin and wash gently with scrungie or a clean washcloth.  5.  Apply the CHG Soap to your body ONLY FROM THE NECK DOWN.  Do not use on open wounds or open sores.  Avoid contact with your eyes, ears, mouth and genitals (private parts).  Wash genitals (private parts) with your normal soap.  6.  Wash thoroughly, paying special attention to the area where your surgery will be performed.  7.  Thoroughly rinse your body with warm water from the neck down.  8.  DO NOT shower/wash with your normal soap after using and rinsing off the CHG Soap.  9.  Fraser Din  yourself dry with a clean towel.            10.  Wear clean pajamas.            11.  Place clean sheets on your bed the night of your first shower and do not sleep with pets.  Day of Surgery  Do not apply any lotions/deodorants the morning of surgery.  Please wear clean clothes to the hospital/surgery center.  Please read over the following fact sheets that you were given. Pain Booklet, Coughing and Deep Breathing, Blood Transfusion Information and Surgical Site Infection Prevention

## 2015-12-11 NOTE — Progress Notes (Signed)
CAll to A. Zelenak,PA-C, to review EKG after 5pm, pt. Asymptomatic & will be evaluated DOS.

## 2015-12-11 NOTE — Progress Notes (Signed)
Pt. Reports that he is free of any chest concerns, denies any prior cardiac testing. Pt. Followed by Dr. Raliegh Ip. At [facility].

## 2015-12-12 ENCOUNTER — Ambulatory Visit (HOSPITAL_COMMUNITY): Payer: BLUE CROSS/BLUE SHIELD

## 2015-12-12 ENCOUNTER — Ambulatory Visit (HOSPITAL_COMMUNITY): Payer: BLUE CROSS/BLUE SHIELD | Admitting: Anesthesiology

## 2015-12-12 ENCOUNTER — Ambulatory Visit (HOSPITAL_COMMUNITY)
Admission: RE | Admit: 2015-12-12 | Discharge: 2015-12-12 | Disposition: A | Discharge status: Home / Self Care | Payer: BLUE CROSS/BLUE SHIELD | Source: Ambulatory Visit | Attending: Orthopedic Surgery | Admitting: Orthopedic Surgery

## 2015-12-12 ENCOUNTER — Encounter (HOSPITAL_COMMUNITY): Payer: Self-pay | Admitting: Anesthesiology

## 2015-12-12 ENCOUNTER — Encounter (HOSPITAL_COMMUNITY)
Admission: RE | Disposition: A | Discharge status: Home / Self Care | Payer: Self-pay | Source: Ambulatory Visit | Attending: Orthopedic Surgery

## 2015-12-12 DIAGNOSIS — Z6841 Body Mass Index (BMI) 40.0 and over, adult: Secondary | ICD-10-CM | POA: Insufficient documentation

## 2015-12-12 DIAGNOSIS — F17220 Nicotine dependence, chewing tobacco, uncomplicated: Secondary | ICD-10-CM | POA: Insufficient documentation

## 2015-12-12 DIAGNOSIS — I10 Essential (primary) hypertension: Secondary | ICD-10-CM | POA: Insufficient documentation

## 2015-12-12 DIAGNOSIS — Y838 Other surgical procedures as the cause of abnormal reaction of the patient, or of later complication, without mention of misadventure at the time of the procedure: Secondary | ICD-10-CM | POA: Insufficient documentation

## 2015-12-12 DIAGNOSIS — T8484XA Pain due to internal orthopedic prosthetic devices, implants and grafts, initial encounter: Secondary | ICD-10-CM | POA: Diagnosis not present

## 2015-12-12 DIAGNOSIS — Z419 Encounter for procedure for purposes other than remedying health state, unspecified: Secondary | ICD-10-CM

## 2015-12-12 DIAGNOSIS — M1711 Unilateral primary osteoarthritis, right knee: Secondary | ICD-10-CM | POA: Insufficient documentation

## 2015-12-12 HISTORY — PX: HARDWARE REMOVAL: SHX979

## 2015-12-12 SURGERY — REMOVAL, HARDWARE
Anesthesia: General | Laterality: Right

## 2015-12-12 MED ORDER — MIDAZOLAM HCL 2 MG/2ML IJ SOLN
INTRAMUSCULAR | Status: AC
  Filled 2015-12-12: qty 2

## 2015-12-12 MED ORDER — ROCURONIUM BROMIDE 50 MG/5ML IV SOLN
INTRAVENOUS | Status: AC
  Filled 2015-12-12: qty 1

## 2015-12-12 MED ORDER — PROMETHAZINE HCL 25 MG/ML IJ SOLN
INTRAMUSCULAR | Status: AC
  Filled 2015-12-12: qty 1

## 2015-12-12 MED ORDER — PROMETHAZINE HCL 25 MG/ML IJ SOLN
6.2500 mg | INTRAMUSCULAR | Status: DC | PRN
Start: 2015-12-12 — End: 2015-12-12
  Administered 2015-12-12: 6.25 mg via INTRAVENOUS

## 2015-12-12 MED ORDER — DEXAMETHASONE SODIUM PHOSPHATE 10 MG/ML IJ SOLN
INTRAMUSCULAR | Status: DC | PRN
  Administered 2015-12-12: 10 mg via INTRAVENOUS

## 2015-12-12 MED ORDER — ROCURONIUM BROMIDE 100 MG/10ML IV SOLN
INTRAVENOUS | Status: DC | PRN
  Administered 2015-12-12: 20 mg via INTRAVENOUS
  Administered 2015-12-12: 50 mg via INTRAVENOUS

## 2015-12-12 MED ORDER — ONDANSETRON HCL 4 MG/2ML IJ SOLN
INTRAMUSCULAR | Status: DC | PRN
  Administered 2015-12-12: 4 mg via INTRAVENOUS

## 2015-12-12 MED ORDER — BUPIVACAINE-EPINEPHRINE (PF) 0.5% -1:200000 IJ SOLN
INTRAMUSCULAR | Status: AC
  Filled 2015-12-12: qty 30

## 2015-12-12 MED ORDER — PROPOFOL 10 MG/ML IV BOLUS
INTRAVENOUS | Status: AC
  Filled 2015-12-12: qty 20

## 2015-12-12 MED ORDER — BUPIVACAINE LIPOSOME 1.3 % IJ SUSP
20.0000 mL | INTRAMUSCULAR | Status: AC
  Administered 2015-12-12: 20 mL
  Filled 2015-12-12: qty 20

## 2015-12-12 MED ORDER — 0.9 % SODIUM CHLORIDE (POUR BTL) OPTIME
TOPICAL | Status: DC | PRN
  Administered 2015-12-12: 1000 mL

## 2015-12-12 MED ORDER — KETOROLAC TROMETHAMINE 30 MG/ML IJ SOLN
30.0000 mg | Freq: Once | INTRAMUSCULAR | Status: AC
  Administered 2015-12-12: 30 mg via INTRAVENOUS

## 2015-12-12 MED ORDER — LIDOCAINE HCL (CARDIAC) 20 MG/ML IV SOLN
INTRAVENOUS | Status: AC
  Filled 2015-12-12: qty 5

## 2015-12-12 MED ORDER — KETOROLAC TROMETHAMINE 10 MG PO TABS: 10.0000 mg | ORAL_TABLET | Freq: Four times a day (QID) | ORAL | Status: DC | PRN

## 2015-12-12 MED ORDER — PROMETHAZINE HCL 12.5 MG PO TABS: 12.5000 mg | ORAL_TABLET | Freq: Four times a day (QID) | ORAL | Status: DC | PRN

## 2015-12-12 MED ORDER — PROPOFOL 10 MG/ML IV BOLUS
INTRAVENOUS | Status: DC | PRN
  Administered 2015-12-12: 200 mg via INTRAVENOUS

## 2015-12-12 MED ORDER — FENTANYL CITRATE (PF) 250 MCG/5ML IJ SOLN
INTRAMUSCULAR | Status: AC
  Filled 2015-12-12: qty 5

## 2015-12-12 MED ORDER — FENTANYL CITRATE (PF) 100 MCG/2ML IJ SOLN
INTRAMUSCULAR | Status: DC | PRN
  Administered 2015-12-12 (×2): 50 ug via INTRAVENOUS
  Administered 2015-12-12: 100 ug via INTRAVENOUS
  Administered 2015-12-12: 50 ug via INTRAVENOUS
  Administered 2015-12-12: 100 ug via INTRAVENOUS

## 2015-12-12 MED ORDER — HYDROMORPHONE HCL 1 MG/ML IJ SOLN
INTRAMUSCULAR | Status: AC
  Administered 2015-12-12: 0.5 mg via INTRAVENOUS
  Filled 2015-12-12: qty 1

## 2015-12-12 MED ORDER — HYDROMORPHONE HCL 1 MG/ML IJ SOLN
INTRAMUSCULAR | Status: AC
  Filled 2015-12-12: qty 1

## 2015-12-12 MED ORDER — KETOROLAC TROMETHAMINE 30 MG/ML IJ SOLN
INTRAMUSCULAR | Status: AC
  Filled 2015-12-12: qty 1

## 2015-12-12 MED ORDER — LACTATED RINGERS IV SOLN
INTRAVENOUS | Status: DC | PRN
  Administered 2015-12-12 (×2): via INTRAVENOUS

## 2015-12-12 MED ORDER — SUCCINYLCHOLINE CHLORIDE 20 MG/ML IJ SOLN
INTRAMUSCULAR | Status: DC | PRN
  Administered 2015-12-12: 120 mg via INTRAVENOUS

## 2015-12-12 MED ORDER — HYDROMORPHONE HCL 1 MG/ML IJ SOLN
0.2500 mg | INTRAMUSCULAR | Status: DC | PRN
  Administered 2015-12-12 (×4): 0.5 mg via INTRAVENOUS

## 2015-12-12 MED ORDER — LIDOCAINE HCL (CARDIAC) 20 MG/ML IV SOLN
INTRAVENOUS | Status: DC | PRN
  Administered 2015-12-12: 80 mg via INTRAVENOUS

## 2015-12-12 MED ORDER — MIDAZOLAM HCL 5 MG/5ML IJ SOLN
INTRAMUSCULAR | Status: DC | PRN
  Administered 2015-12-12: 2 mg via INTRAVENOUS

## 2015-12-12 SURGICAL SUPPLY — 65 items
BANDAGE ELASTIC 4 VELCRO ST LF (GAUZE/BANDAGES/DRESSINGS) ×2 IMPLANT
BANDAGE ELASTIC 6 VELCRO ST LF (GAUZE/BANDAGES/DRESSINGS) ×2 IMPLANT
BANDAGE ESMARK 6X9 LF (GAUZE/BANDAGES/DRESSINGS) ×1 IMPLANT
BNDG COHESIVE 6X5 TAN STRL LF (GAUZE/BANDAGES/DRESSINGS) IMPLANT
BNDG ESMARK 6X9 LF (GAUZE/BANDAGES/DRESSINGS) ×2
BNDG GAUZE ELAST 4 BULKY (GAUZE/BANDAGES/DRESSINGS) IMPLANT
BRUSH SCRUB DISP (MISCELLANEOUS) ×4 IMPLANT
CANISTER SUCTION WELLS/JOHNSON (MISCELLANEOUS) ×2 IMPLANT
CLEANER TIP ELECTROSURG 2X2 (MISCELLANEOUS) ×2 IMPLANT
COVER SURGICAL LIGHT HANDLE (MISCELLANEOUS) ×2 IMPLANT
CUFF TOURNIQUET SINGLE 18IN (TOURNIQUET CUFF) IMPLANT
CUFF TOURNIQUET SINGLE 24IN (TOURNIQUET CUFF) IMPLANT
CUFF TOURNIQUET SINGLE 34IN LL (TOURNIQUET CUFF) ×2 IMPLANT
DRAPE C-ARM 42X72 X-RAY (DRAPES) IMPLANT
DRAPE C-ARMOR (DRAPES) ×2 IMPLANT
DRAPE OEC MINIVIEW 54X84 (DRAPES) IMPLANT
DRAPE U-SHAPE 47X51 STRL (DRAPES) ×2 IMPLANT
DRSG ADAPTIC 3X8 NADH LF (GAUZE/BANDAGES/DRESSINGS) ×2 IMPLANT
DRSG MEPILEX BORDER 4X4 (GAUZE/BANDAGES/DRESSINGS) ×2 IMPLANT
DRSG MEPILEX BORDER 4X8 (GAUZE/BANDAGES/DRESSINGS) ×2 IMPLANT
ELECT BLADE 4.0 EZ CLEAN MEGAD (MISCELLANEOUS) ×2
ELECT REM PT RETURN 9FT ADLT (ELECTROSURGICAL) ×2
ELECTRODE BLDE 4.0 EZ CLN MEGD (MISCELLANEOUS) ×1 IMPLANT
ELECTRODE REM PT RTRN 9FT ADLT (ELECTROSURGICAL) ×1 IMPLANT
EVACUATOR 1/8 PVC DRAIN (DRAIN) IMPLANT
GAUZE SPONGE 4X4 12PLY STRL (GAUZE/BANDAGES/DRESSINGS) ×2 IMPLANT
GLOVE BIO SURGEON STRL SZ7.5 (GLOVE) ×2 IMPLANT
GLOVE BIO SURGEON STRL SZ8 (GLOVE) ×2 IMPLANT
GLOVE BIOGEL PI IND STRL 7.5 (GLOVE) ×1 IMPLANT
GLOVE BIOGEL PI IND STRL 8 (GLOVE) ×1 IMPLANT
GLOVE BIOGEL PI INDICATOR 7.5 (GLOVE) ×1
GLOVE BIOGEL PI INDICATOR 8 (GLOVE) ×1
GOWN STRL REUS W/ TWL LRG LVL3 (GOWN DISPOSABLE) ×2 IMPLANT
GOWN STRL REUS W/ TWL XL LVL3 (GOWN DISPOSABLE) ×1 IMPLANT
GOWN STRL REUS W/TWL LRG LVL3 (GOWN DISPOSABLE) ×2
GOWN STRL REUS W/TWL XL LVL3 (GOWN DISPOSABLE) ×1
KIT BASIN OR (CUSTOM PROCEDURE TRAY) ×2 IMPLANT
KIT ROOM TURNOVER OR (KITS) ×2 IMPLANT
LIGHT ORTHO (MISCELLANEOUS) ×2 IMPLANT
MANIFOLD NEPTUNE II (INSTRUMENTS) IMPLANT
NEEDLE 22X1 1/2 (OR ONLY) (NEEDLE) ×2 IMPLANT
NS IRRIG 1000ML POUR BTL (IV SOLUTION) ×2 IMPLANT
PACK ORTHO EXTREMITY (CUSTOM PROCEDURE TRAY) ×2 IMPLANT
PAD ARMBOARD 7.5X6 YLW CONV (MISCELLANEOUS) ×8 IMPLANT
PADDING CAST COTTON 6X4 STRL (CAST SUPPLIES) ×6 IMPLANT
SPONGE LAP 18X18 X RAY DECT (DISPOSABLE) ×2 IMPLANT
SPONGE SCRUB IODOPHOR (GAUZE/BANDAGES/DRESSINGS) ×2 IMPLANT
STAPLER VISISTAT 35W (STAPLE) IMPLANT
STOCKINETTE IMPERVIOUS LG (DRAPES) ×2 IMPLANT
STRIP CLOSURE SKIN 1/2X4 (GAUZE/BANDAGES/DRESSINGS) IMPLANT
SUCTION FRAZIER HANDLE 10FR (MISCELLANEOUS)
SUCTION FRAZIER TIP 10 FR DISP (SUCTIONS) ×2 IMPLANT
SUCTION TUBE FRAZIER 10FR DISP (MISCELLANEOUS) IMPLANT
SUT ETHILON 3 0 PS 1 (SUTURE) ×6 IMPLANT
SUT PDS AB 2-0 CT1 27 (SUTURE) IMPLANT
SUT VIC AB 0 CT1 27 (SUTURE) ×1
SUT VIC AB 0 CT1 27XBRD ANBCTR (SUTURE) ×1 IMPLANT
SUT VIC AB 2-0 CT1 27 (SUTURE) ×2
SUT VIC AB 2-0 CT1 TAPERPNT 27 (SUTURE) ×2 IMPLANT
SYR CONTROL 10ML LL (SYRINGE) ×2 IMPLANT
TOWEL OR 17X24 6PK STRL BLUE (TOWEL DISPOSABLE) ×4 IMPLANT
TOWEL OR 17X26 10 PK STRL BLUE (TOWEL DISPOSABLE) ×6 IMPLANT
TUBE CONNECTING 12X1/4 (SUCTIONS) ×4 IMPLANT
UNDERPAD 30X30 INCONTINENT (UNDERPADS AND DIAPERS) ×2 IMPLANT
YANKAUER SUCT BULB TIP NO VENT (SUCTIONS) ×4 IMPLANT

## 2015-12-12 NOTE — Anesthesia Postprocedure Evaluation (Signed)
Anesthesia Post Note  Patient: Gregory Barrett  Procedure(s) Performed: Procedure(s) (LRB): HARDWARE REMOVAL RIGHT TIBIAL (Right)  Patient location during evaluation: PACU Anesthesia Type: General Level of consciousness: awake and alert Pain management: pain level controlled Vital Signs Assessment: post-procedure vital signs reviewed and stable Respiratory status: spontaneous breathing, nonlabored ventilation, respiratory function stable and patient connected to nasal cannula oxygen Cardiovascular status: blood pressure returned to baseline and stable Postop Assessment: no signs of nausea or vomiting Anesthetic complications: no    Last Vitals:  Filed Vitals:   12/12/15 1330 12/12/15 1342  BP: 129/79 152/99  Pulse:  82  Temp:  36.1 C  Resp:      Last Pain:  Filed Vitals:   12/12/15 1403  PainSc: 2                  Macaila Tahir DAVID

## 2015-12-12 NOTE — Discharge Instructions (Signed)
[facility] Discharge Instructions   General Discharge Instructions  WEIGHT BEARING STATUS: Weightbear as tolerated  RANGE OF MOTION/ACTIVITY: as tolerated  Wound Care: daily dressing changes starting on 12/15/2015. See instructions below  Discharge Wound Care Instructions  Do NOT apply any ointments, solutions or lotions to pin sites or surgical wounds.  These prevent needed drainage and even though solutions like hydrogen peroxide kill bacteria, they also damage cells lining the pin sites that help fight infection.  Applying lotions or ointments can keep the wounds moist and can cause them to breakdown and open up as well. This can increase the risk for infection. When in doubt call the office.  Surgical incisions should be dressed daily.  If any drainage is noted, use one layer of adaptic, then gauze, Kerlix, and an ace wrap.  Once the incision is completely dry and without drainage, it may be left open to air out.  Showering may begin 36-48 hours later.  Cleaning gently with soap and water.  Traumatic wounds should be dressed daily as well.    One layer of adaptic, gauze, Kerlix, then ace wrap.  The adaptic can be discontinued once the draining has ceased    If you have a wet to dry dressing: wet the gauze with saline the squeeze as much saline out so the gauze is moist (not soaking wet), place moistened gauze over wound, then place a dry gauze over the moist one, followed by Kerlix wrap, then ace wrap.    PAIN MEDICATION USE AND EXPECTATIONS  You have likely been given narcotic medications to help control your pain.  After a traumatic event that results in an fracture (broken bone) with or without surgery, it is ok to use narcotic pain medications to help control one's pain.  We understand that everyone responds to pain differently and each individual patient will be evaluated on a regular basis for the continued need for narcotic medications. Ideally, narcotic  medication use should last no more than 6-8 weeks (coinciding with fracture healing).   As a patient it is your responsibility as well to monitor narcotic medication use and report the amount and frequency you use these medications when you come to your office visit.   We would also advise that if you are using narcotic medications, you should take a dose prior to therapy to maximize you participation.  IF YOU ARE ON NARCOTIC MEDICATIONS IT IS NOT PERMISSIBLE TO OPERATE A MOTOR VEHICLE (MOTORCYCLE/CAR/TRUCK/MOPED) OR HEAVY MACHINERY DO NOT MIX NARCOTICS WITH OTHER CNS (CENTRAL NERVOUS SYSTEM) DEPRESSANTS SUCH AS ALCOHOL  Diet: as you were eating previously.  Can use over the counter stool softeners and bowel preparations, such as Miralax, to help with bowel movements.  Narcotics can be constipating.  Be sure to drink plenty of fluids    STOP SMOKING OR USING NICOTINE PRODUCTS!!!!  As discussed nicotine severely impairs your body's ability to heal surgical and traumatic wounds but also impairs bone healing.  Wounds and bone heal by forming microscopic blood vessels (angiogenesis) and nicotine is a vasoconstrictor (essentially, shrinks blood vessels).  Therefore, if vasoconstriction occurs to these microscopic blood vessels they essentially disappear and are unable to deliver necessary nutrients to the healing tissue.  This is one modifiable factor that you can do to dramatically increase your chances of healing your injury.    (This means no smoking, no nicotine gum, patches, etc)  DO NOT USE NONSTEROIDAL ANTI-INFLAMMATORY DRUGS (NSAID'S)  Using products such as Advil (ibuprofen), Aleve (naproxen),  Motrin (ibuprofen) for additional pain control during fracture healing can delay and/or prevent the healing response.  If you would like to take over the counter (OTC) medication, Tylenol (acetaminophen) is ok.  However, some narcotic medications that are given for pain control contain acetaminophen as  well. Therefore, you should not exceed more than 4000 mg of tylenol in a day if you do not have liver disease.  Also note that there are may OTC medicines, such as cold medicines and allergy medicines that my contain tylenol as well.  If you have any questions about medications and/or interactions please ask your doctor/PA or your pharmacist.      ICE AND ELEVATE INJURED/OPERATIVE EXTREMITY  Using ice and elevating the injured extremity above your heart can help with swelling and pain control.  Icing in a pulsatile fashion, such as 20 minutes on and 20 minutes off, can be followed.    Do not place ice directly on skin. Make sure there is a barrier between to skin and the ice pack.    Using frozen items such as frozen peas works well as the conform nicely to the are that needs to be iced.  USE AN ACE WRAP OR TED HOSE FOR SWELLING CONTROL  In addition to icing and elevation, Ace wraps or TED hose are used to help limit and resolve swelling.  It is recommended to use Ace wraps or TED hose until you are informed to stop.    When using Ace Wraps start the wrapping distally (farthest away from the body) and wrap proximally (closer to the body)   Example: If you had surgery on your leg or thing and you do not have a splint on, start the ace wrap at the toes and work your way up to the thigh        If you had surgery on your upper extremity and do not have a splint on, start the ace wrap at your fingers and work your way up to the upper arm  IF YOU ARE IN A SPLINT OR CAST DO NOT [place]   If your splint gets wet for any reason please contact the office immediately. You may shower in your splint or cast as long as you keep it dry.  This can be done by wrapping in a cast cover or garbage back (or similar)  Do Not stick any thing down your splint or cast such as pencils, money, or hangers to try and scratch yourself with.  If you feel itchy take benadryl as prescribed on the bottle for  itching  IF YOU ARE IN A CAM BOOT (BLACK BOOT)  You may remove boot periodically. Perform daily dressing changes as noted below.  Wash the liner of the boot regularly and wear a sock when wearing the boot. It is recommended that you sleep in the boot until told otherwise  [place] OR CONCERTS: [id number]

## 2015-12-12 NOTE — Anesthesia Procedure Notes (Signed)
Procedure Name: Intubation Date/Time: 12/12/2015 8:11 AM Performed by: Purvis Kilts Pre-anesthesia Checklist: Emergency Drugs available, Patient identified, Suction available, Patient being monitored and Timeout performed Patient Re-evaluated:Patient Re-evaluated prior to inductionOxygen Delivery Method: Circle system utilized Preoxygenation: Pre-oxygenation with 100% oxygen Intubation Type: IV induction Ventilation: Mask ventilation without difficulty Laryngoscope Size: Glidescope Tube type: Oral Tube size: 8.0 mm Number of attempts: 2 Placement Confirmation: ETT inserted through vocal cords under direct vision,  positive ETCO2 and breath sounds checked- equal and bilateral Secured at: 22 cm Tube secured with: Tape Dental Injury: Teeth and Oropharynx as per pre-operative assessment

## 2015-12-12 NOTE — Brief Op Note (Signed)
12/12/2015  12:42 PM  PATIENT:  Gregory Barrett  45 y.o. male  PRE-OPERATIVE DIAGNOSIS:  SYMPTOMATIC HARDWARE RIGHT TIBIA  POST-OPERATIVE DIAGNOSIS:  SYMPTOMATIC HARDWARE RIGHT TIBIA  PROCEDURE:  Procedure(s): HARDWARE REMOVAL RIGHT TIBIA (Right)  SURGEON:  Surgeon(s) and Role:    * Altamese [place], MD - Primary  PHYSICIAN ASSISTANT: Ainsley Spinner, PA-C  ANESTHESIA:   general  I/O:  Total I/O In: 1500 [I.V.:1500] Out: 25 [Blood:25]  SPECIMEN:  No Specimen  TOURNIQUET:    DICTATION: .Other Dictation: Dictation Number [id number]

## 2015-12-12 NOTE — Op Note (Signed)
NAMEMATHIAS, CICERO                ACCOUNT NO.:  [id number]  MEDICAL RECORD NO.:  [id number]  LOCATION:  [facility]                         FACILITY:  [facility]  PHYSICIAN:  Astrid Divine. Marcelino Scot, M.D. DATE OF BIRTH:  1971-02-24  DATE OF PROCEDURE:  12/12/2015 DATE OF DISCHARGE:  12/12/2015                              OPERATIVE REPORT   PREOPERATIVE DIAGNOSIS:  Symptomatic hardware, right tibia.  POSTOPERATIVE DIAGNOSIS:  Symptomatic hardware, right tibia.  PROCEDURE:  Removal of hardware, right tibia.  SURGEON:  Astrid Divine. Marcelino Scot, M.D.  ASSISTANT:  Ainsley Spinner, PA-C.  ANESTHESIA:  General.  COMPLICATIONS:  None.  TOURNIQUET:  None.  DISPOSITION:  To PACU.  CONDITION:  Stable.  BRIEF SUMMARY AND INDICATION FOR PROCEDURE:  Gregory Barrett is a very pleasant 45 year old male who had a remote fracture of the tibia, treated with ORIF and resulting in some slight malunion.  Subsequent to that, he had a posterior shear tibial plateau involving both the medial and lateral sides that underwent ORIF through a dual posterior approach. The patient went on unite and has enjoyed 11 years of good function but has had some increasing pain and arthritis type symptoms over the last year and now presents for interval removal of hardware and in preparation for probable total knee arthroplasty within the next 5-10 years.  I discussed with him the difficulty of removal given its location in the popliteal fossa, potential for a vascular injury specifically as well as neurologic injury and also failure to alleviate symptoms, DVT, PE, heart attack, stroke, need for further surgery, and multiple others.  The patient acknowledged these risks and did wish to proceed.  BRIEF SUMMARY OF PROCEDURE:  The patient was given preoperative antibiotics consisting of 3 g of Ancef.  He was taken to the operating room where general anesthesia was induced.  His right lower extremity was prepped and draped in usual sterile  fashion after positioning him prone and padding all prominences appropriately.  The old posteromedial incision was made including a transverse jag in the incision at the popliteal fossa to prevent scarring contracture there.  It was carried dissection carefully down incising the fascia and then going deep to the medial head of the gastroc.  The muscle was reflected medially and the plate was exposed.  Several areas had bone overgrowth that had to be removed with an osteotome under direct visualization.  A small curette was used to identify the heads of the screws and then the screws were removed.  One of the screws that was standard however broke with removal of the head and then retention of the shaft.  After removal of the plate, the trephine was used to remove the near cortex and then the reverse threaded easy out engaged into the screw shaft and the screw was withdrawn without difficulty.  There was a lag screw proximally that was also buried, and we were able to identify the edge of this and then removed the overlying bone with the osteotome.  This was followed by removal of the T-type plate that had been positioned at an angle.  I brought the knee into flexion; and with the help of my assistant, we were able  to remove it.  Assistant was required throughout to protect the popliteal fossa including the popliteal artery and the tibial nerve. The wound was irrigated thoroughly and then closed in standard layered fashion.  Attention was turned laterally where 2 incisions were made along the old surgical scar.  I was unable to remove these percutaneously, and dissection was used consisting of longitudinal split of the retinaculum and subperiosteal elevation over the head of the screw.  I had to use an osteotome to remove overlying bone, engaged the screw and withdrew it without any complication.  The most posterior screw was entirely interosseous but was loose.  There was no purulence or  fibrinous debris to suggest infection but rather somewhat of a bony cavity that was completely clean in appearance.  C-arm was used on multiple views to confirm hardware position and removal.  No different additional exposure was made for the tip of the screw that had broken off in his index procedure.  Again, after irrigation, a standard layered closure was performed with Vicryl and nylon.  Sterile gently compressive dressing was then applied from foot to thigh.  The patient awakened from anesthesia and transferred to the PACU in stable condition.  We did inject the surgical areas with Exparel.  PROGNOSIS:  The patient will be weightbearing as tolerated with ice and elevation above the heart.  We plan to see him back in 2 weeks for removal of sutures and then can discuss a steroid injection or viscosupplementation.  We are hopeful he will get some minimal relief and can delay total knee arthroplasty for several years.     Astrid Divine. Marcelino Scot, M.D.     MHH/MEDQ  D:  12/12/2015  T:  12/12/2015  Job:  [id number]

## 2015-12-12 NOTE — Transfer of Care (Signed)
Immediate Anesthesia Transfer of Care Note  Patient: Gregory Barrett  Procedure(s) Performed: Procedure(s): HARDWARE REMOVAL RIGHT TIBIAL (Right)  Patient Location: PACU  Anesthesia Type:General  Level of Consciousness: awake  Airway & Oxygen Therapy: Patient Spontanous Breathing and Patient connected to nasal cannula oxygen  Post-op Assessment: Report given to RN and Post -op Vital signs reviewed and stable  Post vital signs: Reviewed and stable  Last Vitals:  Filed Vitals:   12/12/15 0628 12/12/15 1104  BP: 140/93 133/80  Pulse: 69 96  Temp: 36.1 C 36.3 C  Resp: 20 13    Complications: No apparent anesthesia complications

## 2015-12-12 NOTE — Progress Notes (Signed)
Orthopedic Tech Progress Note Patient Details:  Gregory Barrett 1971/10/10 [id number]  Ortho Devices Type of Ortho Device: Crutches Ortho Device/Splint Interventions: Application As ordered by Dr. Cristy Folks, Aniayah Alaniz 12/12/2015, 1:49 PM

## 2015-12-13 ENCOUNTER — Encounter (HOSPITAL_COMMUNITY): Payer: Self-pay | Admitting: Orthopedic Surgery

## 2015-12-25 ENCOUNTER — Ambulatory Visit (HOSPITAL_COMMUNITY)
Admission: RE | Admit: 2015-12-25 | Discharge: 2015-12-25 | Disposition: A | Discharge status: Home / Self Care | Payer: BLUE CROSS/BLUE SHIELD | Source: Ambulatory Visit | Attending: Orthopedic Surgery | Admitting: Orthopedic Surgery

## 2015-12-25 ENCOUNTER — Other Ambulatory Visit (HOSPITAL_COMMUNITY): Payer: Self-pay | Admitting: Orthopedic Surgery

## 2015-12-25 DIAGNOSIS — R609 Edema, unspecified: Secondary | ICD-10-CM

## 2015-12-25 DIAGNOSIS — R2241 Localized swelling, mass and lump, right lower limb: Secondary | ICD-10-CM | POA: Insufficient documentation

## 2015-12-25 DIAGNOSIS — M79661 Pain in right lower leg: Secondary | ICD-10-CM | POA: Insufficient documentation

## 2015-12-25 NOTE — Progress Notes (Addendum)
Preliminary results by tech - Venous Duplex Lower Ext. Competed. Negative for deep and superficial vein thrombosis in the right leg. There is a large cystic structure noted in the proximal, medial calf area, which most likely represents a hematoma based on patient's history. Tried calling the office three times to give results, closed for lunch.  Alissa Pharr, BS, RDMS, RVT, 1:35 pm.

## 2016-02-07 ENCOUNTER — Ambulatory Visit (INDEPENDENT_AMBULATORY_CARE_PROVIDER_SITE_OTHER): Payer: BLUE CROSS/BLUE SHIELD | Admitting: Family Medicine

## 2016-02-07 ENCOUNTER — Encounter: Payer: Self-pay | Admitting: Family Medicine

## 2016-02-07 VITALS — BP 120/90 | HR 94 | Temp 99.2°F | Ht 71.0 in | Wt 304.6 lb

## 2016-02-07 DIAGNOSIS — J988 Other specified respiratory disorders: Secondary | ICD-10-CM | POA: Diagnosis not present

## 2016-02-07 MED ORDER — DOXYCYCLINE HYCLATE 100 MG PO CAPS: 100.0000 mg | ORAL_CAPSULE | Freq: Two times a day (BID) | ORAL | Status: DC

## 2016-02-07 MED ORDER — BENZONATATE 100 MG PO CAPS: 100.0000 mg | ORAL_CAPSULE | Freq: Two times a day (BID) | ORAL | Status: DC | PRN

## 2016-02-07 NOTE — Patient Instructions (Addendum)
Before you leave: -Schedule routine follow-up and a blood pressure recheck with your regular provider in 1 month  Complete the antibiotic as instructed. Please observe the warnings and instructions provided with your medication. Please avoid sun exposure. Follow-up promptly if symptoms worsen, new symptoms develop or your symptoms persist despite treatment.

## 2016-02-07 NOTE — Progress Notes (Signed)
HPI:  Gregory Barrett is a pleasant 45 year old here for an acute visit for a cough: -started: A little over a week ago with flulike symptoms including fever, copious nasal congestion, cough, body aches and drainage - he then was improving significantly. However last night he seemed to get worse again with worsening cough, congestion and low-grade fevers. -denies:SOB, NVD, tooth pain, ear pain, sinus pain -has tried: Nothing -sick contacts/travel/risks: no reported flu, strep or tick exposure that he is aware of -Hx of: Lower respiratory infections including pneumonia  ROS: See pertinent positives and negatives per HPI.  Past Medical History  Diagnosis Date  . Migraine   . Hypertension   . GERD (gastroesophageal reflux disease)   . Arthritis     legs, back     Past Surgical History  Procedure Laterality Date  . Knee surgery      right  . Tibia fracture surgery      right  . Vasectomy    . Hardware removal Right 12/12/2015    Procedure: HARDWARE REMOVAL RIGHT TIBIAL;  Surgeon: Altamese Victor, MD;  Location: [facility];  Service: Orthopedics;  Laterality: Right;    Family History  Problem Relation Age of Onset  . Hypertension Maternal Grandmother   . Cancer Maternal Grandfather     lung ca    Social History   Social History  . Marital Status: Married    Spouse Name: N/A  . Number of Children: N/A  . Years of Education: N/A   Social History Main Topics  . Smoking status: Former Research scientist (life sciences)  . Smokeless tobacco: Current User    Types: Chew  . Alcohol Use: Yes     Comment: occasional glass of wine   . Drug Use: No  . Sexual Activity: Not Asked   Other Topics Concern  . None   Social History Narrative     Current outpatient prescriptions:  .  amLODipine (NORVASC) 5 MG tablet, Take 1 tablet (5 mg total) by mouth daily. (Patient taking differently: Take 5 mg by mouth daily before breakfast. ), Disp: 90 tablet, Rfl: 3 .  esomeprazole (NEXIUM) 20 MG capsule, Take 20 mg by mouth  daily before breakfast. OTC, Disp: , Rfl:  .  HYDROcodone-acetaminophen (NORCO/VICODIN) 5-325 MG tablet, Take 1 tablet by mouth every 6 (six) hours as needed., Disp: 60 tablet, Rfl: 0 .  ketorolac (TORADOL) 10 MG tablet, Take 1 tablet (10 mg total) by mouth every 6 (six) hours as needed for moderate pain., Disp: 20 tablet, Rfl: 0 .  lisinopril-hydrochlorothiazide (PRINZIDE,ZESTORETIC) 20-25 MG per tablet, Take 1 tablet by mouth daily., Disp: 90 tablet, Rfl: 3 .  meloxicam (MOBIC) 15 MG tablet, Take 1 tablet (15 mg total) by mouth daily., Disp: 90 tablet, Rfl: 3 .  promethazine (PHENERGAN) 12.5 MG tablet, Take 1-2 tablets (12.5-25 mg total) by mouth every 6 (six) hours as needed for nausea or vomiting., Disp: 30 tablet, Rfl: 0 .  benzonatate (TESSALON) 100 MG capsule, Take 1 capsule (100 mg total) by mouth 2 (two) times daily as needed for cough., Disp: 20 capsule, Rfl: 0 .  doxycycline (VIBRAMYCIN) 100 MG capsule, Take 1 capsule (100 mg total) by mouth 2 (two) times daily., Disp: 20 capsule, Rfl: 0  EXAM:  Filed Vitals:   02/07/16 1107  BP: 120/90  Pulse: 94  Temp: 99.2 F (37.3 C)    Body mass index is 42.5 kg/(m^2).  GENERAL: vitals reviewed and listed above, alert, oriented, appears well hydrated and in no acute  distress  HEENT: atraumatic, conjunttiva clear, no obvious abnormalities on inspection of external nose and ears, normal appearance of ear canals and TMs, thick nasal congestion, mild post oropharyngeal erythema with PND, no tonsillar edema or exudate, no sinus TTP  NECK: no obvious masses on inspection  LUNGS: clear to auscultation bilaterally, no wheezes, rales or rhonchi, good air movement, no signs of respiratory distress  CV: HRRR, no peripheral edema  MS: moves all extremities without noticeable abnormality  PSYCH: pleasant and cooperative, no obvious depression or anxiety  ASSESSMENT AND PLAN:  Discussed the following assessment and plan:  Respiratory  infection  - We discussed potential etiologies, with concern for a secondary bacterial infection following a recent influenza-like illness. We discussed potentially obtaining a chest x-ray versus emperic antibiotics cover for a sinus infection or bacterial lower respiratory tract infection. After discussion of risks and benefits, he opted for empiric treatment with doxycycline. We discussed treatment side effects, likely course, antibiotic misuse, transmission, signs of developing a worsening illness and return precautions. -of course, we advised to return or notify a doctor immediately if symptoms worsen or persist or new concerns arise.    Patient Instructions  Before you leave: -Schedule routine follow-up and a blood pressure recheck with your regular provider in 1 month  Complete the antibiotic as instructed. Please observe the warnings and instructions provided with your medication. Please avoid sun exposure. Follow-up promptly if symptoms worsen, new symptoms develop or your symptoms persist despite treatment.     Colin Benton R.

## 2016-02-07 NOTE — Progress Notes (Signed)
Pre visit review using our clinic review tool, if applicable. No additional management support is needed unless otherwise documented below in the visit note. 

## 2016-02-08 ENCOUNTER — Other Ambulatory Visit: Payer: Self-pay | Admitting: Internal Medicine

## 2016-02-14 DIAGNOSIS — M1731 Unilateral post-traumatic osteoarthritis, right knee: Secondary | ICD-10-CM | POA: Diagnosis not present

## 2016-02-14 DIAGNOSIS — M25561 Pain in right knee: Secondary | ICD-10-CM | POA: Diagnosis not present

## 2016-02-18 ENCOUNTER — Other Ambulatory Visit: Payer: Self-pay | Admitting: Internal Medicine

## 2016-03-08 ENCOUNTER — Ambulatory Visit: Payer: BLUE CROSS/BLUE SHIELD | Admitting: Internal Medicine

## 2016-03-12 ENCOUNTER — Other Ambulatory Visit (HOSPITAL_COMMUNITY): Payer: Self-pay | Admitting: *Deleted

## 2016-03-12 NOTE — Progress Notes (Signed)
ekg 12-11-15 epic

## 2016-03-12 NOTE — Patient Instructions (Addendum)
AUSTINJAMES TREASE  03/12/2016   Your procedure is scheduled on: 03-19-16  Report to [place]  Entrance take [facility]  elevators to 3rd floor to  [place] at 700 AM.  Call this number if you have problems the morning of surgery [phone]   Remember: ONLY 1 PERSON MAY GO WITH YOU TO SHORT STAY TO GET  READY MORNING OF [place].  Do not eat food or drink liquids :After Midnight.     Take these medicines the morning of surgery with A SIP OF WATER: nexium, amlodipine (norvasc), hydrocodone if needed                               You may not have any metal on your body including hair pins and              piercings  Do not wear jewelry, make-up, lotions, powders or perfumes, deodorant             Do not wear nail polish.  Do not shave  48 hours prior to surgery.              Men may shave face and neck.   Do not bring valuables to the hospital. [place].  Contacts, dentures or bridgework may not be worn into surgery.  Leave suitcase in the car. After surgery it may be brought to your room.                 Please read over the following fact sheets you were given: _____________________________________________________________________             [facility] - Preparing for Surgery Before surgery, you can play an important role.  Because skin is not sterile, your skin needs to be as free of germs as possible.  You can reduce the number of germs on your skin by washing with CHG (chlorahexidine gluconate) soap before surgery.  CHG is an antiseptic cleaner which kills germs and bonds with the skin to continue killing germs even after washing. Please DO NOT use if you have an allergy to CHG or antibacterial soaps.  If your skin becomes reddened/irritated stop using the CHG and inform your nurse when you arrive at Short Stay. Do not shave (including legs and underarms) for at least 48 hours prior to the  first CHG shower.  You may shave your face/neck. Please follow these instructions carefully:  1.  Shower with CHG Soap the night before surgery and the  morning of Surgery.  2.  If you choose to wash your hair, wash your hair first as usual with your  normal  shampoo.  3.  After you shampoo, rinse your hair and body thoroughly to remove the  shampoo.                           4.  Use CHG as you would any other liquid soap.  You can apply chg directly  to the skin and wash                       Gently with a scrungie or clean washcloth.  5.  Apply  the CHG Soap to your body ONLY FROM THE NECK DOWN.   Do not use on face/ open                           Wound or open sores. Avoid contact with eyes, ears mouth and genitals (private parts).                       Wash face,  Genitals (private parts) with your normal soap.             6.  Wash thoroughly, paying special attention to the area where your surgery  will be performed.  7.  Thoroughly rinse your body with warm water from the neck down.  8.  DO NOT shower/wash with your normal soap after using and rinsing off  the CHG Soap.                9.  Pat yourself dry with a clean towel.            10.  Wear clean pajamas.            11.  Place clean sheets on your bed the night of your first shower and do not  sleep with pets. Day of Surgery : Do not apply any lotions/deodorants the morning of surgery.  Please wear clean clothes to the hospital/surgery center.  FAILURE TO FOLLOW THESE INSTRUCTIONS MAY RESULT IN THE CANCELLATION OF YOUR SURGERY PATIENT SIGNATURE_________________________________  NURSE SIGNATURE__________________________________  ________________________________________________________________________   Adam Phenix  An incentive spirometer is a tool that can help keep your lungs clear and active. This tool measures how well you are filling your lungs with each breath. Taking long deep breaths may help reverse or decrease  the chance of developing breathing (pulmonary) problems (especially infection) following:  A long period of time when you are unable to move or be active. BEFORE THE PROCEDURE   If the spirometer includes an indicator to show your best effort, your nurse or respiratory therapist will set it to a desired goal.  If possible, sit up straight or lean slightly forward. Try not to slouch.  Hold the incentive spirometer in an upright position. INSTRUCTIONS FOR USE   Sit on the edge of your bed if possible, or sit up as far as you can in bed or on a chair.  Hold the incentive spirometer in an upright position.  Breathe out normally.  Place the mouthpiece in your mouth and seal your lips tightly around it.  Breathe in slowly and as deeply as possible, raising the piston or the ball toward the top of the column.  Hold your breath for 3-5 seconds or for as long as possible. Allow the piston or ball to fall to the bottom of the column.  Remove the mouthpiece from your mouth and breathe out normally.  Rest for a few seconds and repeat Steps 1 through 7 at least 10 times every 1-2 hours when you are awake. Take your time and take a few normal breaths between deep breaths.  The spirometer may include an indicator to show your best effort. Use the indicator as a goal to work toward during each repetition.  After each set of 10 deep breaths, practice coughing to be sure your lungs are clear. If you have an incision (the cut made at the time of surgery), support your incision when coughing by placing a pillow or rolled  up towels firmly against it. Once you are able to get out of bed, walk around indoors and cough well. You may stop using the incentive spirometer when instructed by your caregiver.  RISKS AND COMPLICATIONS  Take your time so you do not get dizzy or light-headed.  If you are in pain, you may need to take or ask for pain medication before doing incentive spirometry. It is harder to  take a deep breath if you are having pain. AFTER USE  Rest and breathe slowly and easily.  It can be helpful to keep track of a log of your progress. Your caregiver can provide you with a simple table to help with this. If you are using the spirometer at home, follow these instructions: [place] IF:   You are having difficultly using the spirometer.  You have trouble using the spirometer as often as instructed.  Your pain medication is not giving enough relief while using the spirometer.  You develop fever of 100.5 F (38.1 C) or higher. SEEK IMMEDIATE MEDICAL CARE IF:   You cough up bloody sputum that had not been present before.  You develop fever of 102 F (38.9 C) or greater.  You develop worsening pain at or near the incision site. MAKE SURE YOU:   Understand these instructions.  Will watch your condition.  Will get help right away if you are not doing well or get worse. Document Released: 02/24/2007 Document Revised: 01/06/2012 Document Reviewed: 04/27/2007 Exit[employer] Patient Information 2014 [employer], [place].   ________________________________________________________________________  WHAT IS A BLOOD TRANSFUSION? Blood Transfusion Information  A transfusion is the replacement of blood or some of its parts. Blood is made up of multiple cells which provide different functions.  Red blood cells carry oxygen and are used for blood loss replacement.  White blood cells fight against infection.  Platelets control bleeding.  Plasma helps clot blood.  Other blood products are available for specialized needs, such as hemophilia or other clotting disorders. BEFORE THE TRANSFUSION  Who gives blood for transfusions?   Healthy volunteers who are fully evaluated to make sure their blood is safe. This is blood bank blood. Transfusion therapy is the safest it has ever been in the practice of medicine. Before blood is taken from a donor, a complete history is taken to  make sure that person has no history of diseases nor engages in risky social behavior (examples are intravenous drug use or sexual activity with multiple partners). The donor's travel history is screened to minimize risk of transmitting infections, such as malaria. The donated blood is tested for signs of infectious diseases, such as HIV and hepatitis. The blood is then tested to be sure it is compatible with you in order to minimize the chance of a transfusion reaction. If you or a relative donates blood, this is often done in anticipation of surgery and is not appropriate for emergency situations. It takes many days to process the donated blood. RISKS AND COMPLICATIONS Although transfusion therapy is very safe and saves many lives, the main dangers of transfusion include:   Getting an infectious disease.  Developing a transfusion reaction. This is an allergic reaction to something in the blood you were given. Every precaution is taken to prevent this. The decision to have a blood transfusion has been considered carefully by your caregiver before blood is given. Blood is not given unless the benefits outweigh the risks. AFTER THE TRANSFUSION  Right after receiving a blood transfusion, you will usually feel  much better and more energetic. This is especially true if your red blood cells have gotten low (anemic). The transfusion raises the level of the red blood cells which carry oxygen, and this usually causes an energy increase.  The nurse administering the transfusion will monitor you carefully for complications. HOME CARE INSTRUCTIONS  No special instructions are needed after a transfusion. You may find your energy is better. Speak with your caregiver about any limitations on activity for underlying diseases you may have. SEEK MEDICAL CARE IF:   Your condition is not improving after your transfusion.  You develop redness or irritation at the intravenous (IV) site. SEEK IMMEDIATE MEDICAL CARE  IF:  Any of the following symptoms occur over the next 12 hours:  Shaking chills.  You have a temperature by mouth above 102 F (38.9 C), not controlled by medicine.  Chest, back, or muscle pain.  People around you feel you are not acting correctly or are confused.  Shortness of breath or difficulty breathing.  Dizziness and fainting.  You get a rash or develop hives.  You have a decrease in urine output.  Your urine turns a dark color or changes to pink, red, or brown. Any of the following symptoms occur over the next 10 days:  You have a temperature by mouth above 102 F (38.9 C), not controlled by medicine.  Shortness of breath.  Weakness after normal activity.  The white part of the eye turns yellow (jaundice).  You have a decrease in the amount of urine or are urinating less often.  Your urine turns a dark color or changes to pink, red, or brown. Document Released: 10/11/2000 Document Revised: 01/06/2012 Document Reviewed: 05/30/2008 [facility] Patient Information 2014 [place], [place].  _______________________________________________________________________

## 2016-03-13 ENCOUNTER — Encounter (HOSPITAL_COMMUNITY): Payer: Self-pay

## 2016-03-13 ENCOUNTER — Encounter (HOSPITAL_COMMUNITY)
Admission: RE | Admit: 2016-03-13 | Discharge: 2016-03-13 | Disposition: A | Discharge status: Home / Self Care | Payer: BLUE CROSS/BLUE SHIELD | Source: Ambulatory Visit | Attending: Orthopedic Surgery | Admitting: Orthopedic Surgery

## 2016-03-13 DIAGNOSIS — Z01812 Encounter for preprocedural laboratory examination: Secondary | ICD-10-CM | POA: Insufficient documentation

## 2016-03-13 DIAGNOSIS — M25561 Pain in right knee: Secondary | ICD-10-CM | POA: Diagnosis not present

## 2016-03-13 DIAGNOSIS — M1711 Unilateral primary osteoarthritis, right knee: Secondary | ICD-10-CM | POA: Insufficient documentation

## 2016-03-13 LAB — CBC
HEMATOCRIT: 42.7 % (ref 39.0–52.0)
Hemoglobin: 15 g/dL (ref 13.0–17.0)
MCH: 30.1 pg (ref 26.0–34.0)
MCHC: 35.1 g/dL (ref 30.0–36.0)
MCV: 85.7 fL (ref 78.0–100.0)
PLATELETS: 305 10*3/uL (ref 150–400)
RBC: 4.98 MIL/uL (ref 4.22–5.81)
RDW: 13 % (ref 11.5–15.5)
WBC: 7.9 10*3/uL (ref 4.0–10.5)

## 2016-03-13 LAB — SURGICAL PCR SCREEN
MRSA, PCR: NEGATIVE
STAPHYLOCOCCUS AUREUS: NEGATIVE

## 2016-03-13 LAB — BASIC METABOLIC PANEL
Anion gap: 9 (ref 5–15)
BUN: 16 mg/dL (ref 6–20)
CHLORIDE: 102 mmol/L (ref 101–111)
CO2: 30 mmol/L (ref 22–32)
CREATININE: 1.06 mg/dL (ref 0.61–1.24)
Calcium: 9 mg/dL (ref 8.9–10.3)
Glucose, Bld: 93 mg/dL (ref 65–99)
POTASSIUM: 3.1 mmol/L — AB (ref 3.5–5.1)
SODIUM: 141 mmol/L (ref 135–145)

## 2016-03-13 LAB — ABO/RH: ABO/RH(D): O POS

## 2016-03-14 NOTE — Progress Notes (Signed)
bmet results faxed to dr olin by epic 

## 2016-03-14 NOTE — H&P (Signed)
TOTAL KNEE ADMISSION H&P  Patient is being admitted for right total knee arthroplasty.  Subjective:  Chief Complaint:    Right knee post-traumatic OA / pain  HPI: Gregory Barrett, 45 y.o. male, has a history of pain and functional disability in the right knee due to trauma and arthritis and has failed non-surgical conservative treatments for greater than 12 weeks to include NSAID's and/or analgesics, corticosteriod injections and activity modification.  Onset of symptoms was abrupt, starting 12 years ago with gradually worsening course since that time. The patient noted prior procedures on the knee to include  ORIF tibia fracture, ORIF tibial plateau fracture and removal of the hardware on the right knee(s).  Patient currently rates pain in the right knee(s) at 9 out of 10 with activity. Patient has worsening of pain with activity and weight bearing, pain that interferes with activities of daily living, pain with passive range of motion, crepitus and joint swelling.  Patient has evidence of periarticular osteophytes, joint space narrowing and previous fracture of the tibial plateau by imaging studies.  There is no active infection.   Risks, benefits and expectations were discussed with the patient.  Risks including but not limited to the risk of anesthesia, blood clots, nerve damage, blood vessel damage, failure of the prosthesis, infection and up to and including death.  Patient understand the risks, benefits and expectations and wishes to proceed with surgery.   PCP: [name], MD  D/C Plans:      Home - Outpatient if possible  Post-op Meds:       No Rx given   Tranexamic Acid:      To be given - IV   Decadron:      Is to be given  FYI:     ASA  Oxycodone    Patient Active Problem List   Diagnosis Date Noted  . Essential hypertension, benign 07/01/2014  . Exogenous obesity 06/25/2012  . Knee pain, right 08/07/2011   Past Medical History  Diagnosis Date  . Migraine   .  Hypertension   . GERD (gastroesophageal reflux disease)   . Arthritis     legs, back     Past Surgical History  Procedure Laterality Date  . Knee surgery      right  . Tibia fracture surgery  YRS AGO    right  . Vasectomy    . Hardware removal Right 12/12/2015    Procedure: HARDWARE REMOVAL RIGHT TIBIAL;  Surgeon: Altamese [place], MD;  Location: [place];  Service: Orthopedics;  Laterality: Right;    No prescriptions prior to admission   No Known Allergies   Social History  Substance Use Topics  . Smoking status: Former Research scientist (life sciences)  . Smokeless tobacco: Former Systems developer    Types: Chew  . Alcohol Use: Yes     Comment: occasional glass of wine     Family History  Problem Relation Age of Onset  . Hypertension Maternal Grandmother   . Cancer Maternal Grandfather     lung ca     Review of Systems  Constitutional: Negative.   Eyes: Negative.   Respiratory: Negative.   Cardiovascular: Negative.   Gastrointestinal: Positive for heartburn.  Genitourinary: Negative.   Musculoskeletal: Positive for joint pain.  Skin: Negative.   Neurological: Positive for headaches.  Endo/Heme/Allergies: Negative.   Psychiatric/Behavioral: Negative.     Objective:  Physical Exam  Constitutional: He is oriented to person, place, and time. He appears well-developed.  HENT:  Head: Normocephalic.  Eyes:  Pupils are equal, round, and reactive to light.  Neck: Neck supple. No JVD present. No tracheal deviation present. No thyromegaly present.  Cardiovascular: Normal rate, regular rhythm, normal heart sounds and intact distal pulses.   Respiratory: Effort normal and breath sounds normal. No stridor. No respiratory distress. He has no wheezes.  GI: Soft. There is no tenderness. There is no guarding.  Musculoskeletal:       Right knee: He exhibits decreased range of motion, swelling, laceration (healed previous incision), abnormal alignment and bony tenderness. He exhibits no ecchymosis, no deformity and no  erythema. Tenderness found.  Lymphadenopathy:    He has no cervical adenopathy.  Neurological: He is alert and oriented to person, place, and time.  Skin: Skin is warm and dry.  Psychiatric: He has a normal mood and affect.       Labs:  Estimated body mass index is 42.50 kg/(m^2) as calculated from the following:   Height as of 02/07/16: 5\' 11"  (1.803 m).   Weight as of 02/07/16: 138.166 kg (304 lb 9.6 oz).   Imaging Review Plain radiographs demonstrate severe degenerative joint disease of the right knee(s).  The bone quality appears to be good for age and reported activity level.  Assessment/Plan:  End stage arthritis, right knee   The patient history, physical examination, clinical judgment of the provider and imaging studies are consistent with end stage degenerative joint disease of the right knee(s) and total knee arthroplasty is deemed medically necessary. The treatment options including medical management, injection therapy arthroscopy and arthroplasty were discussed at length. The risks and benefits of total knee arthroplasty were presented and reviewed. The risks due to aseptic loosening, infection, stiffness, patella tracking problems, thromboembolic complications and other imponderables were discussed. The patient acknowledged the explanation, agreed to proceed with the plan and consent was signed. Patient is being admitted for inpatient treatment for surgery, pain control, PT, OT, prophylactic antibiotics, VTE prophylaxis, progressive ambulation and ADL's and discharge planning. The patient is planning to be discharged home.     [place] Laqueta Bonaventura   PA-C  03/14/2016, 9:50 PM

## 2016-03-18 MED ORDER — CEFAZOLIN SODIUM 10 G IJ SOLR
3.0000 g | INTRAMUSCULAR | Status: AC
  Administered 2016-03-19: 3 g via INTRAVENOUS
  Filled 2016-03-18: qty 3

## 2016-03-19 ENCOUNTER — Ambulatory Visit (HOSPITAL_COMMUNITY): Payer: BLUE CROSS/BLUE SHIELD | Admitting: Anesthesiology

## 2016-03-19 ENCOUNTER — Encounter (HOSPITAL_COMMUNITY): Payer: Self-pay

## 2016-03-19 ENCOUNTER — Ambulatory Visit (HOSPITAL_COMMUNITY)
Admission: RE | Admit: 2016-03-19 | Discharge: 2016-03-19 | Disposition: A | Discharge status: Home / Self Care | Payer: BLUE CROSS/BLUE SHIELD | Source: Ambulatory Visit | Attending: Orthopedic Surgery | Admitting: Orthopedic Surgery

## 2016-03-19 ENCOUNTER — Encounter (HOSPITAL_COMMUNITY)
Admission: RE | Disposition: A | Discharge status: Home / Self Care | Payer: Self-pay | Source: Ambulatory Visit | Attending: Orthopedic Surgery

## 2016-03-19 DIAGNOSIS — M25461 Effusion, right knee: Secondary | ICD-10-CM | POA: Insufficient documentation

## 2016-03-19 DIAGNOSIS — M25761 Osteophyte, right knee: Secondary | ICD-10-CM | POA: Insufficient documentation

## 2016-03-19 DIAGNOSIS — M1731 Unilateral post-traumatic osteoarthritis, right knee: Secondary | ICD-10-CM | POA: Insufficient documentation

## 2016-03-19 DIAGNOSIS — Z87891 Personal history of nicotine dependence: Secondary | ICD-10-CM | POA: Insufficient documentation

## 2016-03-19 DIAGNOSIS — Z96651 Presence of right artificial knee joint: Secondary | ICD-10-CM

## 2016-03-19 DIAGNOSIS — K219 Gastro-esophageal reflux disease without esophagitis: Secondary | ICD-10-CM | POA: Insufficient documentation

## 2016-03-19 DIAGNOSIS — I10 Essential (primary) hypertension: Secondary | ICD-10-CM | POA: Diagnosis not present

## 2016-03-19 DIAGNOSIS — M179 Osteoarthritis of knee, unspecified: Secondary | ICD-10-CM | POA: Diagnosis not present

## 2016-03-19 DIAGNOSIS — M659 Synovitis and tenosynovitis, unspecified: Secondary | ICD-10-CM | POA: Insufficient documentation

## 2016-03-19 DIAGNOSIS — M1711 Unilateral primary osteoarthritis, right knee: Secondary | ICD-10-CM | POA: Diagnosis not present

## 2016-03-19 HISTORY — PX: TOTAL KNEE ARTHROPLASTY: SHX125

## 2016-03-19 LAB — TYPE AND SCREEN
ABO/RH(D): O POS
Antibody Screen: NEGATIVE

## 2016-03-19 SURGERY — ARTHROPLASTY, KNEE, TOTAL
Anesthesia: Spinal | Site: Knee | Laterality: Right

## 2016-03-19 MED ORDER — STERILE WATER FOR IRRIGATION IR SOLN
Status: DC | PRN
  Administered 2016-03-19: 3000 mL

## 2016-03-19 MED ORDER — HYDROMORPHONE HCL 1 MG/ML IJ SOLN
0.2500 mg | INTRAMUSCULAR | Status: DC | PRN
  Administered 2016-03-19 (×4): 0.25 mg via INTRAVENOUS

## 2016-03-19 MED ORDER — LACTATED RINGERS IV SOLN
INTRAVENOUS | Status: DC | PRN
  Administered 2016-03-19: 10:00:00 via INTRAVENOUS

## 2016-03-19 MED ORDER — CEPHALEXIN 500 MG PO CAPS: 500.0000 mg | ORAL_CAPSULE | Freq: Three times a day (TID) | ORAL | Status: DC

## 2016-03-19 MED ORDER — PROPOFOL 10 MG/ML IV BOLUS
INTRAVENOUS | Status: AC
  Filled 2016-03-19: qty 40

## 2016-03-19 MED ORDER — SODIUM CHLORIDE 0.9 % IR SOLN
Status: DC | PRN
  Administered 2016-03-19: 1000 mL

## 2016-03-19 MED ORDER — PROPOFOL 10 MG/ML IV BOLUS
INTRAVENOUS | Status: AC
  Filled 2016-03-19: qty 20

## 2016-03-19 MED ORDER — PROPOFOL 10 MG/ML IV BOLUS
INTRAVENOUS | Status: DC | PRN
  Administered 2016-03-19: 20 mg via INTRAVENOUS

## 2016-03-19 MED ORDER — MIDAZOLAM HCL 2 MG/2ML IJ SOLN
INTRAMUSCULAR | Status: AC
  Filled 2016-03-19: qty 2

## 2016-03-19 MED ORDER — BUPIVACAINE HCL (PF) 0.25 % IJ SOLN
INTRAMUSCULAR | Status: AC
  Filled 2016-03-19: qty 30

## 2016-03-19 MED ORDER — ACETAMINOPHEN 500 MG PO TABS: 1000.0000 mg | ORAL_TABLET | Freq: Three times a day (TID) | ORAL | Status: DC

## 2016-03-19 MED ORDER — EPHEDRINE SULFATE 50 MG/ML IJ SOLN
INTRAMUSCULAR | Status: AC
  Filled 2016-03-19: qty 1

## 2016-03-19 MED ORDER — ASPIRIN EC 325 MG PO TBEC: 325.0000 mg | DELAYED_RELEASE_TABLET | Freq: Two times a day (BID) | ORAL | Status: AC

## 2016-03-19 MED ORDER — BUPIVACAINE-EPINEPHRINE (PF) 0.25% -1:200000 IJ SOLN
INTRAMUSCULAR | Status: AC
  Filled 2016-03-19: qty 30

## 2016-03-19 MED ORDER — ACETAMINOPHEN 10 MG/ML IV SOLN
INTRAVENOUS | Status: AC
  Filled 2016-03-19: qty 100

## 2016-03-19 MED ORDER — ONDANSETRON HCL 4 MG/2ML IJ SOLN: 4.0000 mg | Freq: Once | INTRAMUSCULAR | Status: DC | PRN

## 2016-03-19 MED ORDER — BUPIVACAINE IN DEXTROSE 0.75-8.25 % IT SOLN
INTRATHECAL | Status: DC | PRN
  Administered 2016-03-19: 2 mL via INTRATHECAL

## 2016-03-19 MED ORDER — ONDANSETRON HCL 4 MG/2ML IJ SOLN
INTRAMUSCULAR | Status: AC
  Filled 2016-03-19: qty 2

## 2016-03-19 MED ORDER — HYDROMORPHONE HCL 2 MG PO TABS: 2.0000 mg | ORAL_TABLET | Freq: Four times a day (QID) | ORAL | Status: DC | PRN

## 2016-03-19 MED ORDER — DIPHENHYDRAMINE HCL 25 MG PO CAPS
25.0000 mg | ORAL_CAPSULE | Freq: Four times a day (QID) | ORAL | Status: DC | PRN
  Filled 2016-03-19: qty 1

## 2016-03-19 MED ORDER — DEXAMETHASONE SODIUM PHOSPHATE 10 MG/ML IJ SOLN
INTRAMUSCULAR | Status: AC
  Filled 2016-03-19: qty 1

## 2016-03-19 MED ORDER — OXYCODONE HCL 5 MG PO TABS
5.0000 mg | ORAL_TABLET | ORAL | Status: DC
  Administered 2016-03-19: 10 mg via ORAL
  Filled 2016-03-19: qty 2

## 2016-03-19 MED ORDER — METHOCARBAMOL 500 MG PO TABS
500.0000 mg | ORAL_TABLET | Freq: Four times a day (QID) | ORAL | Status: DC | PRN
Start: 2016-03-19 — End: 2016-03-19

## 2016-03-19 MED ORDER — KETOROLAC TROMETHAMINE 30 MG/ML IJ SOLN
INTRAMUSCULAR | Status: DC | PRN
  Administered 2016-03-19: 30 mg

## 2016-03-19 MED ORDER — KETOROLAC TROMETHAMINE 30 MG/ML IJ SOLN
INTRAMUSCULAR | Status: AC
  Filled 2016-03-19: qty 1

## 2016-03-19 MED ORDER — ACETAMINOPHEN 10 MG/ML IV SOLN
INTRAVENOUS | Status: DC | PRN
  Administered 2016-03-19: 1000 mg via INTRAVENOUS

## 2016-03-19 MED ORDER — DEXAMETHASONE SODIUM PHOSPHATE 10 MG/ML IJ SOLN
INTRAMUSCULAR | Status: DC | PRN
  Administered 2016-03-19: 10 mg via INTRAVENOUS

## 2016-03-19 MED ORDER — MIDAZOLAM HCL 5 MG/5ML IJ SOLN
INTRAMUSCULAR | Status: DC | PRN
  Administered 2016-03-19 (×2): 1 mg via INTRAVENOUS

## 2016-03-19 MED ORDER — DEXAMETHASONE SODIUM PHOSPHATE 10 MG/ML IJ SOLN: 10.0000 mg | Freq: Once | INTRAMUSCULAR | Status: DC

## 2016-03-19 MED ORDER — SODIUM CHLORIDE 0.9 % IJ SOLN
INTRAMUSCULAR | Status: AC
  Filled 2016-03-19: qty 50

## 2016-03-19 MED ORDER — ACETAMINOPHEN 500 MG PO TABS
1000.0000 mg | ORAL_TABLET | Freq: Three times a day (TID) | ORAL | Status: DC
  Filled 2016-03-19 (×5): qty 2

## 2016-03-19 MED ORDER — 0.9 % SODIUM CHLORIDE (POUR BTL) OPTIME
TOPICAL | Status: DC | PRN
  Administered 2016-03-19: 1000 mL

## 2016-03-19 MED ORDER — METHOCARBAMOL 500 MG PO TABS: 500.0000 mg | ORAL_TABLET | Freq: Four times a day (QID) | ORAL | Status: DC | PRN

## 2016-03-19 MED ORDER — SODIUM CHLORIDE 0.9 % IJ SOLN
INTRAMUSCULAR | Status: AC
  Filled 2016-03-19: qty 10

## 2016-03-19 MED ORDER — MEPERIDINE HCL 50 MG/ML IJ SOLN: 6.2500 mg | INTRAMUSCULAR | Status: DC | PRN

## 2016-03-19 MED ORDER — PROPOFOL 500 MG/50ML IV EMUL
INTRAVENOUS | Status: DC | PRN
  Administered 2016-03-19: 150 ug/kg/min via INTRAVENOUS

## 2016-03-19 MED ORDER — FENTANYL CITRATE (PF) 100 MCG/2ML IJ SOLN
INTRAMUSCULAR | Status: DC | PRN
  Administered 2016-03-19 (×2): 50 ug via INTRAVENOUS

## 2016-03-19 MED ORDER — FENTANYL CITRATE (PF) 100 MCG/2ML IJ SOLN
INTRAMUSCULAR | Status: AC
  Filled 2016-03-19: qty 2

## 2016-03-19 MED ORDER — HYDROMORPHONE HCL 1 MG/ML IJ SOLN
INTRAMUSCULAR | Status: AC
  Filled 2016-03-19: qty 1

## 2016-03-19 MED ORDER — CELECOXIB 200 MG PO CAPS: 200.0000 mg | ORAL_CAPSULE | Freq: Two times a day (BID) | ORAL | Status: DC

## 2016-03-19 MED ORDER — SODIUM CHLORIDE 0.9 % IV BOLUS (SEPSIS): 500.0000 mL | Freq: Once | INTRAVENOUS | Status: DC

## 2016-03-19 MED ORDER — SODIUM CHLORIDE 0.9 % IJ SOLN
INTRAMUSCULAR | Status: DC | PRN
  Administered 2016-03-19: 30 mL

## 2016-03-19 MED ORDER — OXYCODONE HCL 5 MG PO TABS
5.0000 mg | ORAL_TABLET | ORAL | Status: DC | PRN
Start: 2016-03-19 — End: 2016-08-06

## 2016-03-19 MED ORDER — BUPIVACAINE-EPINEPHRINE (PF) 0.25% -1:200000 IJ SOLN
INTRAMUSCULAR | Status: DC | PRN
  Administered 2016-03-19: 30 mL

## 2016-03-19 MED ORDER — SODIUM CHLORIDE 0.9 % IV SOLN
1000.0000 mg | Freq: Once | INTRAVENOUS | Status: AC
  Administered 2016-03-19: 1000 mg via INTRAVENOUS
  Filled 2016-03-19: qty 10

## 2016-03-19 MED ORDER — METHOCARBAMOL 1000 MG/10ML IJ SOLN
500.0000 mg | Freq: Four times a day (QID) | INTRAVENOUS | Status: DC | PRN
  Administered 2016-03-19: 500 mg via INTRAVENOUS
  Filled 2016-03-19: qty 5
  Filled 2016-03-19: qty 550

## 2016-03-19 MED ORDER — ONDANSETRON HCL 4 MG/2ML IJ SOLN
INTRAMUSCULAR | Status: DC | PRN
  Administered 2016-03-19: 4 mg via INTRAVENOUS

## 2016-03-19 MED FILL — ASPIRIN EC 325 MG TABLET: 325 | 30 days supply | Qty: 60 | Fill #0

## 2016-03-19 MED FILL — HYDROmorphone HCL 2 MG TABS: 2 | 2 days supply | Qty: 16 | Fill #0

## 2016-03-19 MED FILL — oxyCODONE HCL 5 MG TABS: 5 | 7 days supply | Qty: 120 | Fill #0

## 2016-03-19 MED FILL — METHOCARBAMOL 500 MG TABLET: 500 | 12 days supply | Qty: 50 | Fill #0

## 2016-03-19 MED FILL — CELECOXIB 200 MG CAPSULE: 200 | 30 days supply | Qty: 60 | Fill #0

## 2016-03-19 MED FILL — CEPHALEXIN 500 MG CAPSULE: 500 | 5 days supply | Qty: 15 | Fill #0

## 2016-03-19 SURGICAL SUPPLY — 47 items
BAG DECANTER FOR FLEXI CONT (MISCELLANEOUS) IMPLANT
BAG ZIPLOCK 12X15 (MISCELLANEOUS) IMPLANT
BANDAGE ACE 6X5 VEL STRL LF (GAUZE/BANDAGES/DRESSINGS) ×2 IMPLANT
BLADE SAW SGTL 13.0X1.19X90.0M (BLADE) ×2 IMPLANT
BOWL SMART MIX CTS (DISPOSABLE) ×2 IMPLANT
CAP KNEE TOTAL 3 SIGMA ×2 IMPLANT
CEMENT HV SMART SET (Cement) ×4 IMPLANT
CLOTH BEACON ORANGE TIMEOUT ST (SAFETY) ×2 IMPLANT
CUFF TOURN SGL QUICK 34 (TOURNIQUET CUFF) ×1
CUFF TRNQT CYL 34X4X40X1 (TOURNIQUET CUFF) ×1 IMPLANT
DECANTER SPIKE VIAL GLASS SM (MISCELLANEOUS) ×2 IMPLANT
DRAPE U-SHAPE 47X51 STRL (DRAPES) ×2 IMPLANT
DRESSING AQUACEL AG SP 3.5X10 (GAUZE/BANDAGES/DRESSINGS) ×1 IMPLANT
DRSG AQUACEL AG ADV 3.5X10 (GAUZE/BANDAGES/DRESSINGS) ×2 IMPLANT
DRSG AQUACEL AG SP 3.5X10 (GAUZE/BANDAGES/DRESSINGS) ×2
DURAPREP 26ML APPLICATOR (WOUND CARE) ×4 IMPLANT
ELECT REM PT RETURN 9FT ADLT (ELECTROSURGICAL) ×2
ELECTRODE REM PT RTRN 9FT ADLT (ELECTROSURGICAL) ×1 IMPLANT
GLOVE BIOGEL M 7.0 STRL (GLOVE) IMPLANT
GLOVE BIOGEL PI IND STRL 7.5 (GLOVE) ×4 IMPLANT
GLOVE BIOGEL PI IND STRL 8.5 (GLOVE) ×4 IMPLANT
GLOVE BIOGEL PI INDICATOR 7.5 (GLOVE) ×4
GLOVE BIOGEL PI INDICATOR 8.5 (GLOVE) ×4
GLOVE ECLIPSE 8.0 STRL XLNG CF (GLOVE) ×2 IMPLANT
GLOVE ORTHO TXT STRL SZ7.5 (GLOVE) ×4 IMPLANT
GOWN STRL REUS W/TWL LRG LVL3 (GOWN DISPOSABLE) ×4 IMPLANT
GOWN STRL REUS W/TWL XL LVL3 (GOWN DISPOSABLE) ×4 IMPLANT
HANDPIECE INTERPULSE COAX TIP (DISPOSABLE) ×1
LIQUID BAND (GAUZE/BANDAGES/DRESSINGS) ×4 IMPLANT
MANIFOLD NEPTUNE II (INSTRUMENTS) ×2 IMPLANT
PACK TOTAL KNEE CUSTOM (KITS) ×2 IMPLANT
POSITIONER SURGICAL ARM (MISCELLANEOUS) ×2 IMPLANT
SET HNDPC FAN SPRY TIP SCT (DISPOSABLE) ×1 IMPLANT
SET PAD KNEE POSITIONER (MISCELLANEOUS) ×2 IMPLANT
SPONGE LAP 18X18 X RAY DECT (DISPOSABLE) ×2 IMPLANT
SUT MNCRL AB 4-0 PS2 18 (SUTURE) ×2 IMPLANT
SUT VIC AB 1 CT1 36 (SUTURE) ×4 IMPLANT
SUT VIC AB 2-0 CT1 27 (SUTURE) ×3
SUT VIC AB 2-0 CT1 TAPERPNT 27 (SUTURE) ×3 IMPLANT
SUT VLOC 180 0 24IN GS25 (SUTURE) ×2 IMPLANT
SYR 50ML LL SCALE MARK (SYRINGE) IMPLANT
TRAY FOLEY W/METER SILVER 14FR (SET/KITS/TRAYS/PACK) IMPLANT
TRAY FOLEY W/METER SILVER 16FR (SET/KITS/TRAYS/PACK) ×2 IMPLANT
TRAY TIB SZ 5 REVISION (Knees) ×2 IMPLANT
WATER STERILE IRR 1500ML POUR (IV SOLUTION) ×2 IMPLANT
WRAP KNEE MAXI GEL POST OP (GAUZE/BANDAGES/DRESSINGS) ×2 IMPLANT
YANKAUER SUCT BULB TIP 10FT TU (MISCELLANEOUS) ×2 IMPLANT

## 2016-03-19 NOTE — Anesthesia Preprocedure Evaluation (Signed)
Anesthesia Evaluation  Patient identified by MRN, date of birth, ID band Patient awake    Reviewed: Allergy & Precautions, NPO status , Patient's Chart, lab work & pertinent test results  Airway Mallampati: II  TM Distance: >3 FB Neck ROM: Full    Dental   Pulmonary former smoker,    Pulmonary exam normal        Cardiovascular hypertension, Pt. on medications Normal cardiovascular exam     Neuro/Psych    GI/Hepatic GERD  Medicated and Controlled,  Endo/Other    Renal/GU      Musculoskeletal   Abdominal   Peds  Hematology   Anesthesia Other Findings   Reproductive/Obstetrics                             Anesthesia Physical Anesthesia Plan  ASA: II  Anesthesia Plan: Spinal   Post-op Pain Management:    Induction: Intravenous  Airway Management Planned: Natural Airway  Additional Equipment:   Intra-op Plan:   Post-operative Plan:   Informed Consent: I have reviewed the patients History and Physical, chart, labs and discussed the procedure including the risks, benefits and alternatives for the proposed anesthesia with the patient or authorized representative who has indicated his/her understanding and acceptance.     Plan Discussed with: CRNA and Surgeon  Anesthesia Plan Comments:         Anesthesia Quick Evaluation

## 2016-03-19 NOTE — Anesthesia Postprocedure Evaluation (Signed)
Anesthesia Post Note  Patient: QUAMERE LEMAS  Procedure(s) Performed: Procedure(s) (LRB): RIGHT TOTAL KNEE ARTHROPLASTY (Right)  Patient location during evaluation: PACU Anesthesia Type: Spinal Level of consciousness: oriented and awake and alert Pain management: pain level controlled Vital Signs Assessment: post-procedure vital signs reviewed and stable Respiratory status: spontaneous breathing, respiratory function stable and patient connected to nasal cannula oxygen Cardiovascular status: blood pressure returned to baseline and stable Postop Assessment: no headache and no backache Anesthetic complications: no    Last Vitals:  Filed Vitals:   03/19/16 1405 03/19/16 1415  BP: 129/82 139/85  Pulse: 84 79  Temp: 36.9 C 36.7 C  Resp: 17 17    Last Pain:  Filed Vitals:   03/19/16 1429  PainSc: 7                  Ruthanne Mcneish DAVID

## 2016-03-19 NOTE — Discharge Instructions (Addendum)
INSTRUCTIONS AFTER JOINT REPLACEMENT  ° °o Remove items at home which could result in a fall. This includes throw rugs or furniture in walking pathways °o ICE to the affected joint every three hours while awake for 30 minutes at a time, for at least the first 3-5 days, and then as needed for pain and swelling.  Continue to use ice for pain and swelling. You may notice swelling that will progress down to the foot and ankle.  This is normal after surgery.  Elevate your leg when you are not up walking on it.   °o Continue to use the breathing machine you got in the hospital (incentive spirometer) which will help keep your temperature down.  It is common for your temperature to cycle up and down following surgery, especially at night when you are not up moving around and exerting yourself.  The breathing machine keeps your lungs expanded and your temperature down. ° ° °DIET:  As you were doing prior to hospitalization, we recommend a well-balanced diet. ° °DRESSING / WOUND CARE / SHOWERING ° °Keep the surgical dressing until follow up.  The dressing is water proof, so you can shower without any extra covering.  IF THE DRESSING FALLS OFF or the wound gets wet inside, change the dressing with sterile gauze.  Please use good hand washing techniques before changing the dressing.  Do not use any lotions or creams on the incision until instructed by your surgeon.   ° °ACTIVITY ° °o Increase activity slowly as tolerated, but follow the weight bearing instructions below.   °o No driving for 6 weeks or until further direction given by your physician.  You cannot drive while taking narcotics.  °o No lifting or carrying greater than 10 lbs. until further directed by your surgeon. °o Avoid periods of inactivity such as sitting longer than an hour when not asleep. This helps prevent blood clots.  °o You may return to work once you are authorized by your doctor.  ° ° ° °WEIGHT BEARING  ° °Weight bearing as tolerated with assist  device (walker, cane, etc) as directed, use it as long as suggested by your surgeon or therapist, typically at least 4-6 weeks. ° ° °EXERCISES ° °Results after joint replacement surgery are often greatly improved when you follow the exercise, range of motion and muscle strengthening exercises prescribed by your doctor. Safety measures are also important to protect the joint from further injury. Any time any of these exercises cause you to have increased pain or swelling, decrease what you are doing until you are comfortable again and then slowly increase them. If you have problems or questions, call your caregiver or physical therapist for advice.  ° °Rehabilitation is important following a joint replacement. After just a few days of immobilization, the muscles of the leg can become weakened and shrink (atrophy).  These exercises are designed to build up the tone and strength of the thigh and leg muscles and to improve motion. Often times heat used for twenty to thirty minutes before working out will loosen up your tissues and help with improving the range of motion but do not use heat for the first two weeks following surgery (sometimes heat can increase post-operative swelling).  ° °These exercises can be done on a training (exercise) mat, on the floor, on a table or on a bed. Use whatever works the best and is most comfortable for you.    Use music or television while you are exercising so that   the exercises are a pleasant break in your day. This will make your life better with the exercises acting as a break in your routine that you can look forward to.   Perform all exercises about fifteen times, three times per day or as directed.  You should exercise both the operative leg and the other leg as well. ° °Exercises include: °  °• Quad Sets - Tighten up the muscle on the front of the thigh (Quad) and hold for 5-10 seconds.   °• Straight Leg Raises - With your knee straight (if you were given a brace, keep it on),  lift the leg to 60 degrees, hold for 3 seconds, and slowly lower the leg.  Perform this exercise against resistance later as your leg gets stronger.  °• Leg Slides: Lying on your back, slowly slide your foot toward your buttocks, bending your knee up off the floor (only go as far as is comfortable). Then slowly slide your foot back down until your leg is flat on the floor again.  °• Angel Wings: Lying on your back spread your legs to the side as far apart as you can without causing discomfort.  °• Hamstring Strength:  Lying on your back, push your heel against the floor with your leg straight by tightening up the muscles of your buttocks.  Repeat, but this time bend your knee to a comfortable angle, and push your heel against the floor.  You may put a pillow under the heel to make it more comfortable if necessary.  ° °A rehabilitation program following joint replacement surgery can speed recovery and prevent re-injury in the future due to weakened muscles. Contact your doctor or a physical therapist for more information on knee rehabilitation.  ° ° °CONSTIPATION ° °Constipation is defined medically as fewer than three stools per week and severe constipation as less than one stool per week.  Even if you have a regular bowel pattern at home, your normal regimen is likely to be disrupted due to multiple reasons following surgery.  Combination of anesthesia, postoperative narcotics, change in appetite and fluid intake all can affect your bowels.  ° °YOU MUST use at least one of the following options; they are listed in order of increasing strength to get the job done.  They are all available over the counter, and you may need to use some, POSSIBLY even all of these options:   ° °Drink plenty of fluids (prune juice may be helpful) and high fiber foods °Colace 100 mg by mouth twice a day  °Senokot for constipation as directed and as needed Dulcolax (bisacodyl), take with full glass of water  °Miralax (polyethylene glycol)  once or twice a day as needed. ° °If you have tried all these things and are unable to have a bowel movement in the first 3-4 days after surgery call either your surgeon or your primary doctor.   ° °If you experience loose stools or diarrhea, hold the medications until you stool forms back up.  If your symptoms do not get better within 1 week or if they get worse, check with your doctor.  If you experience "the worst abdominal pain ever" or develop nausea or vomiting, please contact the office immediately for further recommendations for treatment. ° ° °ITCHING:  If you experience itching with your medications, try taking only a single pain pill, or even half a pain pill at a time.  You can also use Benadryl over the counter for itching or also to   help with sleep.  ° °TED HOSE STOCKINGS:  Use stockings on both legs until for at least 2 weeks or as directed by physician office. They may be removed at night for sleeping. ° °MEDICATIONS:  See your medication summary on the “After Visit Summary” that nursing will review with you.  You may have some home medications which will be placed on hold until you complete the course of blood thinner medication.  It is important for you to complete the blood thinner medication as prescribed. ° °PRECAUTIONS:  If you experience chest pain or shortness of breath - call 911 immediately for transfer to the hospital emergency department.  ° °If you develop a fever greater that 101 F, purulent drainage from wound, increased redness or drainage from wound, foul odor from the wound/dressing, or calf pain - CONTACT YOUR SURGEON.   °                                                °FOLLOW-UP APPOINTMENTS:  If you do not already have a post-op appointment, please call the office for an appointment to be seen by your surgeon.  Guidelines for how soon to be seen are listed in your “After Visit Summary”, but are typically between 1-4 weeks after surgery. ° °OTHER INSTRUCTIONS:  ° °Knee  Replacement:  Do not place pillow under knee, focus on keeping the knee straight while resting.  ° °MAKE SURE YOU:  °• Understand these instructions.  °• Get help right away if you are not doing well or get worse.  ° ° °Thank you for letting us be a part of your medical care team.  It is a privilege we respect greatly.  We hope these instructions will help you stay on track for a fast and full recovery!  °  ° ° ° °Spinal Anesthesia and Epidural Anesthesia, Care After °Refer to this sheet in the next few weeks. These instructions provide you with information about caring for yourself after your procedure. Your health care provider may also give you more specific instructions. Your treatment has been planned according to current medical practices, but problems sometimes occur. Call your health care provider if you have any problems or questions after your procedure. °WHAT TO EXPECT AFTER THE PROCEDURE °After your procedure, it is typical to have the following: °· Sleepiness. °· Nausea and vomiting. °HOME CARE INSTRUCTIONS °· For the first 24 hours after anesthetic medicine: °¨ Do not drive or operate heavy machinery. °¨ Do not drink alcohol. °¨ Do not make important decisions. °· Have someone stay with you for at least 24-48 hours. °· Drink enough fluid to keep your urine clear or pale yellow. °SEEK MEDICAL CARE IF: °· You have nausea and vomiting that continue the day after anesthetic medicine was given. °· You develop a rash. °SEEK IMMEDIATE MEDICAL CARE IF:  °· You have a fever. °· You have a persistent or severe headache. °· You develop blurred or double vision. °· You develop dizziness or lightheadedness. °· You faint. °· You have weakness, numbness, or tingling in your arms or legs. °· You have difficulty breathing. °· You are unable to pass urine. °  °This information is not intended to replace advice given to you by your health care provider. Make sure you discuss any questions you have with your health care  provider. °  °Document   Released: 01/04/2004 Document Revised: 11/04/2014 Document Reviewed: 05/25/2014 °Elsevier Interactive Patient Education ©2016 [employer] ° °

## 2016-03-19 NOTE — Evaluation (Signed)
Physical Therapy Evaluation Patient Details Name: Gregory Barrett MRN: [id number] DOB: 06-30-1971 Today's Date: 03/19/2016   History of Present Illness  45 yo male s/p R TKA 03/19/16  Clinical Impression  On eval, pt was supervision-min guard assist for mobility-walked ~115 feet with crutches. Pain rated 8-9/10. Issued HEP for pt to perform 3x/day until OP PT begins. Cautioned pt against "over-doing" it. Highly encouraged crutch use for ambulation for, at least, 2 weeks (pt hesitant to agree to this during eval). All education completed.     Follow Up Recommendations Outpatient PT (Instructed pt to call office tomorrow to get appt as soon as possible. )    Equipment Recommendations  None recommended by PT    Recommendations for Other Services       Precautions / Restrictions Precautions Precautions: Fall Restrictions Weight Bearing Restrictions: No RLE Weight Bearing: Weight bearing as tolerated      Mobility  Bed Mobility Overal bed mobility: Modified Independent                Transfers Overall transfer level: Modified independent                  Ambulation/Gait Ambulation/Gait assistance: Supervision Ambulation Distance (Feet): 115 Feet Assistive device: Crutches Gait Pattern/deviations: Step-to pattern     General Gait Details: supervision for safety. VCs for sequence. good gait speed.   Stairs Stairs: Yes Stairs assistance: Min guard Stair Management: Forwards;Step to pattern;With crutches Number of Stairs: 9 General stair comments: VCs safety, technique, sequence. close guard for safety.   Wheelchair Mobility    Modified Rankin (Stroke Patients Only)       Balance                                             Pertinent Vitals/Pain Pain Assessment: 0-10 Pain Score: 8  Pain Location: R knee with activity Pain Descriptors / Indicators: Sore Pain Intervention(s): Monitored during session;Repositioned    Home Living  Family/patient expects to be discharged to:: Private residence Living Arrangements: Spouse/significant other;Children   Type of Home: House Home Access: Stairs to enter   Technical brewer of Steps: 3 Home Layout: One level Home Equipment: Crutches      Prior Function Level of Independence: Independent               Hand Dominance        Extremity/Trunk Assessment   Upper Extremity Assessment: Overall WFL for tasks assessed           Lower Extremity Assessment: RLE deficits/detail RLE Deficits / Details: strength at least 3/5    Cervical / Trunk Assessment: Normal  Communication   Communication: No difficulties  Cognition Arousal/Alertness: Awake/alert Behavior During Therapy: WFL for tasks assessed/performed Overall Cognitive Status: Within Functional Limits for tasks assessed                      General Comments      Exercises Total Joint Exercises Ankle Circles/Pumps: AROM;Both;10 reps;Supine Quad Sets: AROM;Both;10 reps;Supine Straight Leg Raises: AROM;Right;10 reps;Supine Knee Flexion: AROM;Right;10 reps;Seated Goniometric ROM: ~10-100 degrees      Assessment/Plan    PT Assessment All further PT needs can be met in the next venue of care (OP PT.)  PT Diagnosis     PT Problem List Decreased strength;Decreased range of motion;Decreased mobility;Pain  PT Treatment Interventions  PT Goals (Current goals can be found in the Care Plan section) Acute Rehab PT Goals Patient Stated Goal: regain independence and PLOF PT Goal Formulation: All assessment and education complete, DC therapy    Frequency     Barriers to discharge        Co-evaluation               End of Session   Activity Tolerance: Patient tolerated treatment well Patient left: in bed;with call bell/phone within reach;with nursing/sitter in room;with family/visitor present      Functional Assessment Tool Used: clinical judgement Functional Limitation:  Mobility: Walking and moving around Mobility: Walking and Moving Around Current Status (K7425): At least 1 percent but less than 20 percent impaired, limited or restricted Mobility: Walking and Moving Around Goal Status [phone]): At least 1 percent but less than 20 percent impaired, limited or restricted Mobility: Walking and Moving Around Discharge Status [phone]): At least 1 percent but less than 20 percent impaired, limited or restricted    Time: 3295-1884 PT Time Calculation (min) (ACUTE ONLY): 25 min   Charges:   PT Evaluation $PT Eval Low Complexity: 1 Procedure PT Treatments $Gait Training: 8-22 mins   PT G Codes:   PT G-Codes **NOT FOR INPATIENT CLASS** Functional Assessment Tool Used: clinical judgement Functional Limitation: Mobility: Walking and moving around Mobility: Walking and Moving Around Current Status (Z6606): At least 1 percent but less than 20 percent impaired, limited or restricted Mobility: Walking and Moving Around Goal Status [phone]): At least 1 percent but less than 20 percent impaired, limited or restricted Mobility: Walking and Moving Around Discharge Status [phone]): At least 1 percent but less than 20 percent impaired, limited or restricted    Weston Anna, MPT Pager: [phone]

## 2016-03-19 NOTE — Anesthesia Procedure Notes (Signed)
Spinal Patient location during procedure: OR Start time: 03/19/2016 10:25 AM End time: 03/19/2016 10:30 AM Staffing Anesthesiologist: Lillia Abed Performed by: anesthesiologist  Preanesthetic Checklist Completed: patient identified, site marked, surgical consent, pre-op evaluation, timeout performed, IV checked, risks and benefits discussed and monitors and equipment checked Spinal Block Patient position: sitting Prep: Betadine Patient monitoring: heart rate, cardiac monitor, continuous pulse ox and blood pressure Approach: midline Location: L3-4 Injection technique: single-shot Needle Needle type: Pencan  Needle gauge: 24 G Needle length: 9 cm Needle insertion depth: 9 cm

## 2016-03-19 NOTE — Interval H&P Note (Signed)
History and Physical Interval Note:  03/19/2016 9:15 AM  Gregory Barrett  has presented today for surgery, with the diagnosis of RIGHT KNEE OA   The various methods of treatment have been discussed with the patient and family. After consideration of risks, benefits and other options for treatment, the patient has consented to  Procedure(s): RIGHT TOTAL KNEE ARTHROPLASTY (Right) as a surgical intervention .  The patient's history has been reviewed, patient examined, no change in status, stable for surgery.  I have reviewed the patient's chart and labs.  Questions were answered to the patient's satisfaction.     Mauri Pole

## 2016-03-19 NOTE — Transfer of Care (Signed)
Immediate Anesthesia Transfer of Care Note  Patient: Gregory Barrett  Procedure(s) Performed: Procedure(s): RIGHT TOTAL KNEE ARTHROPLASTY (Right)  Patient Location: PACU  Anesthesia Type:Spinal  Level of Consciousness: awake, alert , oriented and patient cooperative  Airway & Oxygen Therapy: Patient Spontanous Breathing and Patient connected to face mask oxygen  Post-op Assessment: Report given to RN, Post -op Vital signs reviewed and stable and Patient moving all extremities  Post vital signs: Reviewed and stable  Last Vitals:  Filed Vitals:   03/19/16 0706  BP: 128/98  Pulse: 81  Temp: 37 C  Resp: 18    Last Pain:  Filed Vitals:   03/19/16 0731  PainSc: 4          Complications: No apparent anesthesia complications

## 2016-03-21 NOTE — Op Note (Signed)
NAME:  Gregory Barrett                      MEDICAL RECORD NO.:  [id number]                             FACILITY:  [facility]      PHYSICIAN:  Pietro Cassis. Alvan Dame, M.D.  DATE OF BIRTH:  27-Mar-1971      DATE OF PROCEDURE:  03/21/2016                                     OPERATIVE REPORT         PREOPERATIVE DIAGNOSIS:  Right knee osteoarthritis.      POSTOPERATIVE DIAGNOSIS:  Right knee osteoarthritis.      FINDINGS:  The patient was noted to have complete loss of cartilage and   bone-on-bone arthritis with associated osteophytes in all three compartments of   the knee with a significant synovitis and associated effusion.      PROCEDURE:  Right total knee replacement.      COMPONENTS USED:  [employer] rotating platform posterior stabilized knee   system, a size 5 femur, 5 tibia, size 12.5 mm insert, and 38 patellar   button.      SURGEON:  Pietro Cassis. Alvan Dame, M.D.      ASSISTANT:  Danae Orleans, PA-C.      ANESTHESIA:  Spinal.      SPECIMENS:  None.      COMPLICATION:  None.      DRAINS:  None.  EBL: <100cc      TOURNIQUET TIME:   Total Tourniquet Time Documented: Thigh (Right) - 48 minutes Total: Thigh (Right) - 48 minutes  .      The patient was stable to the recovery room.      INDICATION FOR PROCEDURE:  Gregory Barrett is a 45 y.o. male patient of   mine.  The patient had been seen, evaluated, and treated conservatively in the   office with medication, activity modification, and injections.  The patient had   radiographic changes of bone-on-bone arthritis with endplate sclerosis and osteophytes noted.      The patient failed conservative measures including medication, injections, and activity modification, and at this point was ready for more definitive measures.   Based on the radiographic changes and failed conservative measures, the patient   decided to proceed with total knee replacement.  Risks of infection,   DVT, component failure, need for revision surgery,  postop course, and   expectations were all   discussed and reviewed.  Consent was obtained for benefit of pain   relief.      PROCEDURE IN DETAIL:  The patient was brought to the operative theater.   Once adequate anesthesia, preoperative antibiotics, 2 gm of Ancef, 1 gm of Tranexamic Acid, and 10 mg of Decadron administered, the patient was positioned supine with the right thigh tourniquet placed.  The  right lower extremity was prepped and draped in sterile fashion.  A time-   out was performed identifying the patient, planned procedure, and   extremity.      The right lower extremity was placed in the [facility] leg holder.  The leg was   exsanguinated, tourniquet elevated to 250 mmHg.  A midline incision was   made followed by median parapatellar arthrotomy.  Following initial   exposure, attention was first directed to the patella.  Precut   measurement was noted to be 24 mm.  I resected down to 15 mm and used a   38 patellar button to restore patellar height as well as cover the cut   surface.      The lug holes were drilled and a metal shim was placed to protect the   patella from retractors and saw blades.      At this point, attention was now directed to the femur.  The femoral   canal was opened with a drill, irrigated to try to prevent fat emboli.  An   intramedullary rod was passed at 5 degrees valgus, 10 mm of bone was   resected off the distal femur.  Following this resection, the tibia was   subluxated anteriorly.  Using the extramedullary guide, 10 mm of bone was resected off   the proximal lateral tibia.  We confirmed the gap would be   stable medially and laterally with a 10 mm insert as well as confirmed   the cut was perpendicular in the coronal plane, checking with an alignment rod.      Once this was done, I sized the femur to be a size 5 in the anterior-   posterior dimension, chose a standard component based on medial and   lateral dimension.  The size 5 rotation  block was then pinned in   position anterior referenced using the C-clamp to set rotation.  The   anterior, posterior, and  chamfer cuts were made without difficulty nor   notching making certain that I was along the anterior cortex to help   with flexion gap stability.      The final box cut was made off the lateral aspect of distal femur.      At this point, the tibia was sized to be a size 5, the size 5 tray was   then pinned in position through the medial third of the tubercle,   drilled, and keel punched.  Trial reduction was now carried with a 5 femur,  5 tibia, a size 10 then 12.5 mm PS insert, and the 38 patella botton.  The knee was brought to   extension, full extension with good flexion stability with the patella   tracking through the trochlea without application of pressure.  Given   all these findings, the trial components removed.  Final components were   opened and cement was mixed.  The knee was irrigated with normal saline   solution and pulse lavage.  The synovial lining was   then injected with 30 cc of 0.25% Marcaine with epinephrine and 1 cc of Toradol plus 30 cc of NS for a   total of 61 cc.      The knee was irrigated.  Final implants were then cemented onto clean and   dried cut surfaces of bone with the knee brought to extension with a size 12.5 mm trial insert.      Once the cement had fully cured, the excess cement was removed   throughout the knee.  I confirmed I was satisfied with the range of   motion and stability, and the final 12.5 mm insert was chosen.  It was   placed into the knee.      The tourniquet had been let down at 48 minutes.  No significant   hemostasis required  The   extensor mechanism was then reapproximated  using #1 Vicryl and #0 V-lock sutures with the knee   in flexion.  The   remaining wound was closed with 2-0 Vicryl and running 4-0 Monocryl.   The knee was cleaned, dried, dressed sterilely using Dermabond and   Aquacel dressing.   The patient was then   brought to recovery room in stable condition, tolerating the procedure   well.   Please note that Physician Assistant, Danae Orleans, PA-C, was present for the entirety of the case, and was utilized for pre-operative positioning, peri-operative retractor management, general facilitation of the procedure.  He was also utilized for primary wound closure at the end of the case.              Pietro Cassis Alvan Dame, M.D.    03/21/2016 10:50 AM

## 2016-04-03 DIAGNOSIS — M25661 Stiffness of right knee, not elsewhere classified: Secondary | ICD-10-CM | POA: Diagnosis not present

## 2016-04-05 DIAGNOSIS — M25661 Stiffness of right knee, not elsewhere classified: Secondary | ICD-10-CM | POA: Diagnosis not present

## 2016-04-10 DIAGNOSIS — M25661 Stiffness of right knee, not elsewhere classified: Secondary | ICD-10-CM | POA: Diagnosis not present

## 2016-04-12 DIAGNOSIS — M25661 Stiffness of right knee, not elsewhere classified: Secondary | ICD-10-CM | POA: Diagnosis not present

## 2016-04-17 DIAGNOSIS — M25661 Stiffness of right knee, not elsewhere classified: Secondary | ICD-10-CM | POA: Diagnosis not present

## 2016-04-19 DIAGNOSIS — M25661 Stiffness of right knee, not elsewhere classified: Secondary | ICD-10-CM | POA: Diagnosis not present

## 2016-04-26 DIAGNOSIS — Z471 Aftercare following joint replacement surgery: Secondary | ICD-10-CM | POA: Diagnosis not present

## 2016-04-26 DIAGNOSIS — Z96651 Presence of right artificial knee joint: Secondary | ICD-10-CM | POA: Diagnosis not present

## 2016-05-01 DIAGNOSIS — M25661 Stiffness of right knee, not elsewhere classified: Secondary | ICD-10-CM | POA: Diagnosis not present

## 2016-05-03 DIAGNOSIS — M25661 Stiffness of right knee, not elsewhere classified: Secondary | ICD-10-CM | POA: Diagnosis not present

## 2016-05-08 DIAGNOSIS — M25661 Stiffness of right knee, not elsewhere classified: Secondary | ICD-10-CM | POA: Diagnosis not present

## 2016-05-10 DIAGNOSIS — M25661 Stiffness of right knee, not elsewhere classified: Secondary | ICD-10-CM | POA: Diagnosis not present

## 2016-05-15 DIAGNOSIS — M25661 Stiffness of right knee, not elsewhere classified: Secondary | ICD-10-CM | POA: Diagnosis not present

## 2016-05-17 DIAGNOSIS — M25661 Stiffness of right knee, not elsewhere classified: Secondary | ICD-10-CM | POA: Diagnosis not present

## 2016-05-20 DIAGNOSIS — M25661 Stiffness of right knee, not elsewhere classified: Secondary | ICD-10-CM | POA: Diagnosis not present

## 2016-05-22 DIAGNOSIS — M25661 Stiffness of right knee, not elsewhere classified: Secondary | ICD-10-CM | POA: Diagnosis not present

## 2016-05-27 ENCOUNTER — Other Ambulatory Visit: Payer: Self-pay | Admitting: Internal Medicine

## 2016-07-15 DIAGNOSIS — D485 Neoplasm of uncertain behavior of skin: Secondary | ICD-10-CM | POA: Diagnosis not present

## 2016-07-28 ENCOUNTER — Other Ambulatory Visit: Payer: Self-pay | Admitting: Internal Medicine

## 2016-08-06 ENCOUNTER — Encounter: Payer: Self-pay | Admitting: Family Medicine

## 2016-08-06 ENCOUNTER — Ambulatory Visit (INDEPENDENT_AMBULATORY_CARE_PROVIDER_SITE_OTHER): Payer: BLUE CROSS/BLUE SHIELD | Admitting: Family Medicine

## 2016-08-06 VITALS — BP 110/80 | HR 109 | Temp 97.9°F | Ht 71.0 in | Wt 287.0 lb

## 2016-08-06 DIAGNOSIS — R55 Syncope and collapse: Secondary | ICD-10-CM

## 2016-08-06 DIAGNOSIS — I499 Cardiac arrhythmia, unspecified: Secondary | ICD-10-CM

## 2016-08-06 DIAGNOSIS — I4891 Unspecified atrial fibrillation: Secondary | ICD-10-CM | POA: Diagnosis not present

## 2016-08-06 LAB — BASIC METABOLIC PANEL
BUN: 16 mg/dL (ref 6–23)
CALCIUM: 9.5 mg/dL (ref 8.4–10.5)
CO2: 32 meq/L (ref 19–32)
CREATININE: 1.09 mg/dL (ref 0.40–1.50)
Chloride: 100 mEq/L (ref 96–112)
GFR: 78.53 mL/min (ref >60.00)
GLUCOSE: 82 mg/dL (ref 70–99)
Potassium: 3.3 mEq/L — ABNORMAL LOW (ref 3.5–5.1)
Sodium: 142 mEq/L (ref 135–145)

## 2016-08-06 LAB — CBC WITH DIFFERENTIAL/PLATELET
BASOS ABS: 0.1 10*3/uL (ref 0.0–0.1)
Basophils Relative: 0.7 % (ref 0.0–3.0)
EOS ABS: 0.2 10*3/uL (ref 0.0–0.7)
Eosinophils Relative: 2 % (ref 0.0–5.0)
HCT: 44.8 % (ref 39.0–52.0)
Hemoglobin: 15.2 g/dL (ref 13.0–17.0)
LYMPHS ABS: 2.3 10*3/uL (ref 0.7–4.0)
LYMPHS PCT: 27.6 % (ref 12.0–46.0)
MCHC: 33.8 g/dL (ref 30.0–36.0)
MCV: 87.7 fl (ref 78.0–100.0)
MONO ABS: 0.9 10*3/uL (ref 0.1–1.0)
Monocytes Relative: 11.1 % (ref 3.0–12.0)
NEUTROS ABS: 4.9 10*3/uL (ref 1.4–7.7)
NEUTROS PCT: 58.6 % (ref 43.0–77.0)
PLATELETS: 302 10*3/uL (ref 150.0–400.0)
RBC: 5.11 Mil/uL (ref 4.22–5.81)
RDW: 14.2 % (ref 11.5–15.5)
WBC: 8.3 10*3/uL (ref 4.0–10.5)

## 2016-08-06 LAB — TSH: TSH: 2.15 u[IU]/mL (ref 0.35–4.50)

## 2016-08-06 MED ORDER — METOPROLOL SUCCINATE ER 50 MG PO TB24: 50.0000 mg | ORAL_TABLET | Freq: Every day | ORAL | 3 refills | Status: DC

## 2016-08-06 MED ORDER — LISINOPRIL 10 MG PO TABS: 10.0000 mg | ORAL_TABLET | Freq: Every day | ORAL | 3 refills | Status: DC

## 2016-08-06 NOTE — Progress Notes (Signed)
Subjective:     Patient ID: Gregory Barrett, male   DOB: 1970/11/27, 45 y.o.   MRN: [id number]  HPI Patient is seen as a work in with reported syncopal episode at work last Thursday. He states he had recent viral type illness earlier in the week and seemed to be recovering. No significant cough and no fever. He was at work and was leaning over and recalls feeling dizzy. When he woke up he was on oxygen and being attended to by EMS. Estimated loss of consciousness 5 minutes. No observed seizure activity. No post ictal type symptoms. Patient recalls he had not been eating anything that day. No history of diabetes. He has hypertension treated with lisinopril HCTZ and amlodipine. He was not taken to hospital for further assessment. He states his blood pressure was low and heart rate was elevated during assessment and had some issues with slightly low blood pressure and elevated pulse since last Thursday.  Home blood pressures have been ranging [id number] systolic and around 60 diastolic. Pulse ranging around 120.  Patient had total knee replacement last May. Has not had any consistent orthostatic symptoms since then. No recent exertional chest pains.  He has noted some slight increased swelling around the right knee region recently but no calf pain. No history of DVT or pulmonary embolus.  No hx of atrial fibrillation.  Rare ETOH use.  No hx of binge drinking.  Denies any illicit drug use Works as an Conservation officer, nature. Former smoker.  Past Medical History:  Diagnosis Date  . Arthritis    legs, back   . GERD (gastroesophageal reflux disease)   . Hypertension   . Migraine    Past Surgical History:  Procedure Laterality Date  . HARDWARE REMOVAL Right 12/12/2015   Procedure: HARDWARE REMOVAL RIGHT TIBIAL;  Surgeon: Altamese Arcola, MD;  Location: [facility];  Service: Orthopedics;  Laterality: Right;  . KNEE SURGERY     right  . TIBIA FRACTURE SURGERY  YRS AGO   right  . TOTAL KNEE ARTHROPLASTY Right  03/19/2016   Procedure: RIGHT TOTAL KNEE ARTHROPLASTY;  Surgeon: Paralee Cancel, MD;  Location: [facility];  Service: Orthopedics;  Laterality: Right;  Marland Kitchen VASECTOMY      reports that he has quit smoking. He has quit using smokeless tobacco. His smokeless tobacco use included Chew. He reports that he drinks alcohol. He reports that he does not use drugs. family history includes Cancer in his maternal grandfather; Hypertension in his maternal grandmother. No Known Allergies   Review of Systems  Constitutional: Positive for fatigue. Negative for chills and fever.  HENT: Negative for congestion and sore throat.   Eyes: Negative for visual disturbance.  Respiratory: Negative for cough.   Cardiovascular: Negative for chest pain and leg swelling.  Gastrointestinal: Negative for abdominal pain, diarrhea, nausea and vomiting.  Endocrine: Negative for polydipsia and polyuria.  Genitourinary: Negative for dysuria.  Neurological: Positive for dizziness and syncope (As per history of present illness). Negative for tremors, seizures and weakness.  Hematological: Negative for adenopathy. Does not bruise/bleed easily.       Objective:   Physical Exam  Constitutional: He appears well-developed and well-nourished.  HENT:  Mouth/Throat: Oropharynx is clear and moist.  Neck: Neck supple. No thyromegaly present.  Cardiovascular:  Patient slightly tachycardic with irregular rhythm  Pulmonary/Chest: Effort normal and breath sounds normal. No respiratory distress. He has no wheezes. He has no rales. He exhibits no tenderness.  Abdominal: Soft. There is no tenderness.  Musculoskeletal:  He exhibits no edema.  Lymphadenopathy:    He has no cervical adenopathy.       Assessment:     #1 recent reported syncopal episode at work. Etiology unclear. Possibly related to hypotension. Doubt seizure by history.  Also see below (new onset A fib)  #2 irregular heart rhythm. EKG shows probable atrial fibrillation.  He  does have hypertension history but this apparently has been fairly well controlled. Low clinical suspicion for pulmonary embolus. No clinical findings to suggest recent pneumonia. CHA2DS2-VASc score of 1 (hypertension)  #3 history of hypertension. Recent hypotension as above.    Plan:     -check labs: BMP, TSH, CBC, d-dimer -Start 81 mg aspirin daily -Stop amlodipine and lisinopril HCTZ -Start Toprol-XL 50 mg once daily and lisinopril 10 mg once daily -Schedule echocardiogram -Set up cardiology referral -We'll not start anticoagulation with CHA2DS2-VASc score of 1 unless cardiology feels this is warranted  Eulas Post MD [facility]

## 2016-08-06 NOTE — Patient Instructions (Signed)

## 2016-08-06 NOTE — Progress Notes (Signed)
Pre visit review using our clinic review tool, if applicable. No additional management support is needed unless otherwise documented below in the visit note. 

## 2016-08-07 ENCOUNTER — Telehealth: Payer: Self-pay | Admitting: Internal Medicine

## 2016-08-07 ENCOUNTER — Other Ambulatory Visit: Payer: Self-pay

## 2016-08-07 ENCOUNTER — Ambulatory Visit (INDEPENDENT_AMBULATORY_CARE_PROVIDER_SITE_OTHER)
Admission: RE | Admit: 2016-08-07 | Discharge: 2016-08-07 | Disposition: A | Discharge status: Home / Self Care | Payer: BLUE CROSS/BLUE SHIELD | Source: Ambulatory Visit | Attending: Family Medicine | Admitting: Family Medicine

## 2016-08-07 DIAGNOSIS — I4891 Unspecified atrial fibrillation: Secondary | ICD-10-CM | POA: Diagnosis not present

## 2016-08-07 DIAGNOSIS — R7989 Other specified abnormal findings of blood chemistry: Secondary | ICD-10-CM

## 2016-08-07 LAB — D-DIMER, QUANTITATIVE: D-Dimer, Quant: 1.38 mcg/mL FEU — ABNORMAL HIGH (ref <0.50)

## 2016-08-07 MED ORDER — ASPIRIN EC 81 MG PO TBEC: 81.0000 mg | DELAYED_RELEASE_TABLET | Freq: Every day | ORAL | Status: DC

## 2016-08-07 MED ORDER — IOPAMIDOL (ISOVUE-370) INJECTION 76%
80.0000 mL | Freq: Once | INTRAVENOUS | Status: AC | PRN
  Administered 2016-08-07: 80 mL via INTRAVENOUS

## 2016-08-07 NOTE — Telephone Encounter (Signed)
CT scan was negative. Gregory Barrett sent him home and told that patient that someone will call him with the results.

## 2016-08-07 NOTE — Telephone Encounter (Signed)
Let him know no PE.  He should be getting call regarding Cardiology appt soon.

## 2016-08-07 NOTE — Telephone Encounter (Signed)
Please review

## 2016-08-08 NOTE — Telephone Encounter (Signed)
Pt is aware of annotations via voicemail. I have made multiple attempts to contact pt.

## 2016-08-08 NOTE — Telephone Encounter (Signed)
Tried calling pt with NA. 

## 2016-08-12 ENCOUNTER — Ambulatory Visit (INDEPENDENT_AMBULATORY_CARE_PROVIDER_SITE_OTHER): Payer: BLUE CROSS/BLUE SHIELD | Admitting: Cardiology

## 2016-08-12 ENCOUNTER — Encounter: Payer: Self-pay | Admitting: Cardiology

## 2016-08-12 ENCOUNTER — Encounter (INDEPENDENT_AMBULATORY_CARE_PROVIDER_SITE_OTHER): Payer: Self-pay

## 2016-08-12 VITALS — BP 136/98 | HR 88 | Ht 71.0 in | Wt 288.4 lb

## 2016-08-12 DIAGNOSIS — I480 Paroxysmal atrial fibrillation: Secondary | ICD-10-CM | POA: Diagnosis not present

## 2016-08-12 MED ORDER — RIVAROXABAN 20 MG PO TABS: 20.0000 mg | ORAL_TABLET | Freq: Every day | ORAL | 0 refills | Status: DC

## 2016-08-12 MED ORDER — RIVAROXABAN 20 MG PO TABS: 20.0000 mg | ORAL_TABLET | Freq: Every day | ORAL | 6 refills | Status: DC

## 2016-08-12 NOTE — Progress Notes (Signed)
Electrophysiology Office Note   Date:  08/12/2016   ID:  Gregory Barrett, DOB 13-Jun-1971, MRN [id number]  PCP:  Gregory Cowden, MD Primary Electrophysiologist:  Gregory Berberich Meredith Leeds, MD    Chief Complaint  Patient presents with  . New Patient (Initial Visit)    new Afib  . Chest Pain  . Shortness of Breath  . Dizziness     History of Present Illness: Gregory Barrett is a 45 y.o. male who presents today for electrophysiology evaluation.   History of hypertension who presented to his PCP with syncope and newly diagnosed atrial fibrillation. Syncope was at work.  Was leaning over and felt dizzy.  Woke up and was on oxygen and being tended to by EMS.  Lost off consciousness for ~5 minutes. No seizure activity. At the time, per the patient, he was hypotensive and possibly bradycardic. He states that when he had the episode of syncope that he was sick with a viral illness and had been feeling dizzy for a few days.  He was diagnosed with atrial fibrillation at that time.  Since that time, he has been having fatigue, SOB and palpitations.  He has not been as active as he did not know if activity would make things worse.  Per his wife, he does not sore or hold his breath when sleeping.  Prior to his viral illness, he was feeling well.   Today, he denies symptoms of chest pain,  orthopnea, PND, lower extremity edema, claudication, dizziness, presyncope, syncope, bleeding, or neurologic sequela. The patient is tolerating medications without difficulties and is otherwise without complaint today.    Past Medical History:  Diagnosis Date  . Arthritis    legs, back   . GERD (gastroesophageal reflux disease)   . Hypertension   . Migraine    Past Surgical History:  Procedure Laterality Date  . HARDWARE REMOVAL Right 12/12/2015   Procedure: HARDWARE REMOVAL RIGHT TIBIAL;  Surgeon: Gregory Spaulding, MD;  Location: [facility];  Service: Orthopedics;  Laterality: Right;  . KNEE SURGERY     right    . TIBIA FRACTURE SURGERY  YRS AGO   right  . TOTAL KNEE ARTHROPLASTY Right 03/19/2016   Procedure: RIGHT TOTAL KNEE ARTHROPLASTY;  Surgeon: Gregory Cancel, MD;  Location: [facility];  Service: Orthopedics;  Laterality: Right;  Marland Kitchen VASECTOMY       Current Outpatient Prescriptions  Medication Sig Dispense Refill  . esomeprazole (NEXIUM) 20 MG capsule Take 20 mg by mouth daily before breakfast.     . lisinopril (PRINIVIL,ZESTRIL) 10 MG tablet Take 1 tablet (10 mg total) by mouth daily. 90 tablet 3  . metoprolol succinate (TOPROL-XL) 50 MG 24 hr tablet Take 1 tablet (50 mg total) by mouth daily. Take with or immediately following a meal. 90 tablet 3  . rivaroxaban (XARELTO) 20 MG TABS tablet Take 1 tablet (20 mg total) by mouth daily with supper. 30 tablet 0  . rivaroxaban (XARELTO) 20 MG TABS tablet Take 1 tablet (20 mg total) by mouth daily with supper. 30 tablet 6   No current facility-administered medications for this visit.     Allergies:   Review of patient's allergies indicates no known allergies.   Social History:  The patient  reports that he has quit smoking. He has quit using smokeless tobacco. His smokeless tobacco use included Chew. He reports that he drinks alcohol. He reports that he does not use drugs.   Family History:  The patient's family history includes Cancer  in his maternal grandfather; Hypertension in his maternal grandmother.    ROS:  Please see the history of present illness.   Otherwise, review of systems is positive for Chills, fatigue, chest pain, dyspnea on exertion, chest pressure, palpitations, muscle pain, dizziness.   All other systems are reviewed and negative.    PHYSICAL EXAM: VS:  BP (!) 136/98   Pulse 88   Ht 5\' 11"  (1.803 m)   Wt 288 lb 6.4 oz (130.8 kg)   BMI 40.22 kg/m  , BMI Body mass index is 40.22 kg/m. GEN: Well nourished, well developed, in no acute distress  HEENT: normal  Neck: no JVD, carotid bruits, or masses Cardiac: iRRR; no murmurs,  rubs, or gallops,no edema  Respiratory:  clear to auscultation bilaterally, normal work of breathing GI: soft, nontender, nondistended, + BS MS: no deformity or atrophy  Skin: warm and dry Neuro:  Strength and sensation are intact Psych: euthymic mood, full affect  EKG:  EKG is ordered today. Personal review of the ekg ordered shows atrial fibrillation, rate 88  Recent Labs: 12/11/2015: ALT 34 08/06/2016: BUN 16; Creatinine, Ser 1.09; Hemoglobin 15.2; Platelets 302.0; Potassium 3.3; Sodium 142; TSH 2.15    Lipid Panel     Component Value Date/Time   CHOL 168 07/01/2014 1019   TRIG 77.0 07/01/2014 1019   HDL 38.00 (L) 07/01/2014 1019   CHOLHDL 4 07/01/2014 1019   VLDL 15.4 07/01/2014 1019   LDLCALC 115 (H) 07/01/2014 1019     Wt Readings from Last 3 Encounters:  08/12/16 288 lb 6.4 oz (130.8 kg)  08/06/16 287 lb (130.2 kg)  03/19/16 299 lb (135.6 kg)      Other studies Reviewed: Additional studies/ records that were reviewed today include: PCP notes  ASSESSMENT AND PLAN:  1. Persistent atrial fibrillation: Has had atrial fibrlliaotn since a viral illness.  Since that time has had symptoms of fatigue and SOB.  Due to that, would prefer a rhythm control approach.  He has not yet had a TTE and thus Etan Vasudevan order today.  He has a low stroke risk but as we are planning a rhythm control approach Leliana Kontz start Xarelto. Lavern Crimi plan for cardioversion 3 weeks after initiation of Xarelto. Once he has had 3 weeks of Xarelto, Elkin Belfield plan to start Flecainide if his TTE shows no evidence of structural heart disease.  We did discuss ablation, but he is not interested in procedures at this time.  This patients CHA2DS2-VASc Score and unadjusted Ischemic Stroke Rate (% per year) is equal to 0.6 % stroke rate/year from a score of 1  Above score calculated as 1 point each if present [CHF, HTN, DM, Vascular=MI/PAD/Aortic Plaque, Age if 65-74, or Male] Above score calculated as 2 points each if present  [Age > 75, or Stroke/TIA/TE]  2. Hypertension: well controlled  3. Syncope: per history, happened when he was sick with a viral illness.  Due to that, does not require driving restrictions as it was likely due to his viral illness.  He is aware that if syncope occurs again, he would not be able to drive and would need further evaluation for secondary rhythm issues.    Current medicines are reviewed at length with the patient today.   The patient does not have concerns regarding his medicines.  The following changes were made today:  Xarelto  Labs/ tests ordered today include: TTE Orders Placed This Encounter  Procedures  . EKG 12-Lead     Disposition:   [facility]  with Zuzu Befort 3 months  Signed, Erma Raiche Meredith Leeds, MD  08/12/2016 9:02 PM     [address] [place] [address] [phone] (office) [phone] (fax)

## 2016-08-12 NOTE — Patient Instructions (Signed)
Medication Instructions:    Your physician has recommended you make the following change in your medication:   1) STOP Aspirin  2) START Xarelto 20 mg daily  --- If you need a refill on your cardiac medications before your next appointment, please call your pharmacy. ---  Labwork:  None ordered  Testing/Procedures: Your physician has recommended that you have a Cardioversion (DCCV). Electrical Cardioversion uses a jolt of electricity to your heart either through paddles or wired patches attached to your chest. This is a controlled, usually prescheduled, procedure. Defibrillation is done under light anesthesia in the hospital, and you usually go home the day of the procedure. This is done to get your heart back into a normal rhythm. You are not awake for the procedure.   Vidal Lampkins, RN will call you to schedule this.   Follow-Up:  Your physician recommends that you schedule a follow-up appointment in: 3 months with Dr. Curt Bears.   Thank you for choosing [facility]!!   Trinidad Curet, RN [phone]  Any Other Special Instructions Will Be Listed Below (If Applicable).   Electrical Cardioversion Electrical cardioversion is the delivery of a jolt of electricity to change the rhythm of the heart. Sticky patches or metal paddles are placed on the chest to deliver the electricity from a device. This is done to restore a normal rhythm. A rhythm that is too fast or not regular keeps the heart from pumping well. Electrical cardioversion is done in an emergency if:   There is low or no blood pressure as a result of the heart rhythm.   Normal rhythm must be restored as fast as possible to protect the brain and heart from further damage.   It may save a life. Cardioversion may be done for heart rhythms that are not immediately life threatening, such as atrial fibrillation or flutter, in which:   The heart is beating too fast or is not regular.   Medicine to change the rhythm has not  worked.   It is safe to wait in order to allow time for preparation.  Symptoms of the abnormal rhythm are bothersome.  The risk of stroke and other serious problems can be reduced. LET [facility] CARE PROVIDER KNOW ABOUT:   Any allergies you have.  All medicines you are taking, including vitamins, herbs, eye drops, creams, and over-the-counter medicines.  Previous problems you or members of your family have had with the use of anesthetics.   Any blood disorders you have.   Previous surgeries you have had.   Medical conditions you have. RISKS AND COMPLICATIONS  Generally, this is a safe procedure. However, problems can occur and include:   Breathing problems related to the anesthetic used.  A blood clot that breaks free and travels to other parts of your body. This could cause a stroke or other problems. The risk of this is lowered by use of blood-thinning medicine (anticoagulant) prior to the procedure.  Cardiac arrest (rare). BEFORE THE PROCEDURE   You may have tests to detect blood clots in your heart and to evaluate heart function.  You may start taking anticoagulants so your blood does not clot as easily.   Medicines may be given to help stabilize your heart rate and rhythm. PROCEDURE  You will be given medicine through an IV tube to reduce discomfort and make you sleepy (sedative).   An electrical shock will be delivered. AFTER THE PROCEDURE Your heart rhythm will be watched to make sure it does not change.  This information is not intended to replace advice given to you by your health care provider. Make sure you discuss any questions you have with your health care provider.   Document Released: 10/04/2002 Document Revised: 11/04/2014 Document Reviewed: 04/28/2013 Elsevier Interactive Patient Education [employer].

## 2016-08-13 ENCOUNTER — Ambulatory Visit (INDEPENDENT_AMBULATORY_CARE_PROVIDER_SITE_OTHER): Payer: BLUE CROSS/BLUE SHIELD | Admitting: Internal Medicine

## 2016-08-13 VITALS — BP 132/74 | HR 96 | Temp 98.5°F | Wt 288.2 lb

## 2016-08-13 DIAGNOSIS — I481 Persistent atrial fibrillation: Secondary | ICD-10-CM | POA: Diagnosis not present

## 2016-08-13 DIAGNOSIS — I4819 Other persistent atrial fibrillation: Secondary | ICD-10-CM

## 2016-08-13 DIAGNOSIS — I10 Essential (primary) hypertension: Secondary | ICD-10-CM

## 2016-08-13 NOTE — Patient Instructions (Addendum)
Limit your sodium (Salt) intake  Cardiology follow-up as scheduled  Please check your blood pressure on a regular basis.  If it is consistently greater than 150/90, or pulse rate consistently greater than 100.  Increase metoprolol to 100 mg daily

## 2016-08-13 NOTE — Progress Notes (Signed)
Pre visit review using our clinic review tool, if applicable. No additional management support is needed unless otherwise documented below in the visit note. 

## 2016-08-14 ENCOUNTER — Encounter: Payer: Self-pay | Admitting: Internal Medicine

## 2016-08-14 DIAGNOSIS — I4891 Unspecified atrial fibrillation: Secondary | ICD-10-CM | POA: Insufficient documentation

## 2016-08-14 NOTE — Progress Notes (Signed)
Subjective:    Patient ID: Gregory Barrett, male    DOB: Aug 18, 1971, 45 y.o.   MRN: [id number]  HPI  45 year old patient who is had recent right TKA.  He has developed atrial fibrillation and was evaluated by cardiology yesterday.  He is now on anticoagulation and scheduled for cardioversion in approximately 3 weeks. He has a history of essential hypertension and blood pressure has been trending up slightly since his blood pressure regimen has been altered and diuretic therapy discontinued.  He generally feels well and recovering nicely from his knee surgery. Anti-inflammatory medication has been discontinued.  Past Medical History:  Diagnosis Date  . Arthritis    legs, back   . GERD (gastroesophageal reflux disease)   . Hypertension   . Migraine      Social History   Social History  . Marital status: Married    Spouse name: N/A  . Number of children: N/A  . Years of education: N/A   Occupational History  . Not on file.   Social History Main Topics  . Smoking status: Former Research scientist (life sciences)  . Smokeless tobacco: Former Systems developer    Types: Chew  . Alcohol use Yes     Comment: occasional glass of wine   . Drug use: No  . Sexual activity: Not on file   Other Topics Concern  . Not on file   Social History Narrative  . No narrative on file    Past Surgical History:  Procedure Laterality Date  . HARDWARE REMOVAL Right 12/12/2015   Procedure: HARDWARE REMOVAL RIGHT TIBIAL;  Surgeon: Gregory Delmar, MD;  Location: [facility];  Service: Orthopedics;  Laterality: Right;  . KNEE SURGERY     right  . TIBIA FRACTURE SURGERY  YRS AGO   right  . TOTAL KNEE ARTHROPLASTY Right 03/19/2016   Procedure: RIGHT TOTAL KNEE ARTHROPLASTY;  Surgeon: Gregory Cancel, MD;  Location: [facility];  Service: Orthopedics;  Laterality: Right;  Marland Kitchen VASECTOMY      Family History  Problem Relation Age of Onset  . Hypertension Maternal Grandmother   . Cancer Maternal Grandfather     lung ca    No Known  Allergies  Current Outpatient Prescriptions on File Prior to Visit  Medication Sig Dispense Refill  . esomeprazole (NEXIUM) 20 MG capsule Take 20 mg by mouth daily before breakfast.     . lisinopril (PRINIVIL,ZESTRIL) 10 MG tablet Take 1 tablet (10 mg total) by mouth daily. 90 tablet 3  . metoprolol succinate (TOPROL-XL) 50 MG 24 hr tablet Take 1 tablet (50 mg total) by mouth daily. Take with or immediately following a meal. 90 tablet 3  . rivaroxaban (XARELTO) 20 MG TABS tablet Take 1 tablet (20 mg total) by mouth daily with supper. 30 tablet 0   No current facility-administered medications on file prior to visit.     BP 132/74 (BP Location: Left Arm, Patient Position: Sitting, Cuff Size: Normal)   Pulse 96   Temp 98.5 F (36.9 C) (Oral)   Wt 288 lb 3.2 oz (130.7 kg)   SpO2 98%   BMI 40.20 kg/m     Review of Systems  Cardiovascular: Positive for palpitations.  Musculoskeletal: Positive for arthralgias and gait problem.       Objective:   Physical Exam  Constitutional: He is oriented to person, place, and time. He appears well-developed.  Weight 288 Blood pressure 130/80  HENT:  Head: Normocephalic.  Right Ear: External ear normal.  Left Ear: External ear normal.  Eyes: Conjunctivae and EOM are normal.  Neck: Normal range of motion.  Cardiovascular: Normal rate and normal heart sounds.   Rhythm is irregular with controlled ventricular response  Pulmonary/Chest: Breath sounds normal.  Abdominal: Bowel sounds are normal.  Musculoskeletal: Normal range of motion. He exhibits no edema or tenderness.  Status post right TKA  Neurological: He is alert and oriented to person, place, and time.  Psychiatric: He has a normal mood and affect. His behavior is normal.          Assessment & Plan:   Persistent atrial fibrillation.  We'll continue anticoagulation and rate control regimen.  Cardiology follow-up in 3 weeks for elective cardioversion.  2-D echo pending Essential  hypertension.  No change in medical regimen at this time.  The patient has been counseled to increase metoprolol for elevated blood pressure or elevated pulse.  He was given parameters  Gregory Barrett

## 2016-08-20 ENCOUNTER — Other Ambulatory Visit: Payer: Self-pay

## 2016-08-20 ENCOUNTER — Ambulatory Visit (HOSPITAL_COMMUNITY): Payer: BLUE CROSS/BLUE SHIELD | Attending: Cardiology

## 2016-08-20 DIAGNOSIS — I4891 Unspecified atrial fibrillation: Secondary | ICD-10-CM | POA: Diagnosis not present

## 2016-08-20 DIAGNOSIS — R55 Syncope and collapse: Secondary | ICD-10-CM

## 2016-08-23 ENCOUNTER — Telehealth: Payer: Self-pay | Admitting: *Deleted

## 2016-08-23 DIAGNOSIS — I480 Paroxysmal atrial fibrillation: Secondary | ICD-10-CM

## 2016-08-23 DIAGNOSIS — Z01812 Encounter for preprocedural laboratory examination: Secondary | ICD-10-CM

## 2016-08-23 NOTE — Telephone Encounter (Signed)
Confirmed w/ pt, he started Xarelto 10/17. Scheduled DCCV for 11/8. Will call patient next week to rv instructions

## 2016-08-23 NOTE — Telephone Encounter (Signed)
Follow up ° ° ° °Pt verbalized that he is returning call for rn °

## 2016-08-23 NOTE — Telephone Encounter (Signed)
lmtcb to scheduled DCCV

## 2016-08-27 NOTE — Telephone Encounter (Signed)
Spoke with wife -- pt in the shower. Pt cannot make it to DCCV on 11/8.  They understand I will look into 11/15 or 11/17 per their request and call pt tomorrow to review instructions.

## 2016-08-27 NOTE — Telephone Encounter (Signed)
Pt returned call to [place] call [phone]

## 2016-08-28 ENCOUNTER — Other Ambulatory Visit: Payer: Self-pay | Admitting: Cardiology

## 2016-08-29 ENCOUNTER — Encounter: Payer: Self-pay | Admitting: *Deleted

## 2016-08-29 ENCOUNTER — Other Ambulatory Visit: Payer: BLUE CROSS/BLUE SHIELD

## 2016-08-29 NOTE — Addendum Note (Signed)
Addended by: Stanton Kidney on: 08/29/2016 02:57 PM   Modules accepted: Orders

## 2016-08-29 NOTE — Telephone Encounter (Signed)
DCCV scheduled for 11/17. Letter of instructions reviewed with patient and left at front desk. Pre procedure labs on 11/10. I will call him after DCCV to discuss beginning Flecainide. Patient verbalized understanding and agreeable to plan.

## 2016-09-06 ENCOUNTER — Other Ambulatory Visit: Payer: BLUE CROSS/BLUE SHIELD | Admitting: *Deleted

## 2016-09-06 DIAGNOSIS — Z01812 Encounter for preprocedural laboratory examination: Secondary | ICD-10-CM | POA: Diagnosis not present

## 2016-09-06 DIAGNOSIS — I480 Paroxysmal atrial fibrillation: Secondary | ICD-10-CM

## 2016-09-06 LAB — CBC WITH DIFFERENTIAL/PLATELET
Basophils Absolute: 92 cells/uL (ref 0–200)
Basophils Relative: 1 %
Eosinophils Absolute: 92 cells/uL (ref 15–500)
Eosinophils Relative: 1 %
HEMATOCRIT: 46.3 % (ref 38.5–50.0)
HEMOGLOBIN: 15.4 g/dL (ref 13.2–17.1)
LYMPHS ABS: 3404 {cells}/uL (ref 850–3900)
Lymphocytes Relative: 37 %
MCH: 30 pg (ref 27.0–33.0)
MCHC: 33.3 g/dL (ref 32.0–36.0)
MCV: 90.3 fL (ref 80.0–100.0)
MONO ABS: 920 {cells}/uL (ref 200–950)
MPV: 9.6 fL (ref 7.5–12.5)
Monocytes Relative: 10 %
NEUTROS PCT: 51 %
Neutro Abs: 4692 cells/uL (ref 1500–7800)
Platelets: 319 10*3/uL (ref 140–400)
RBC: 5.13 MIL/uL (ref 4.20–5.80)
RDW: 13.9 % (ref 11.0–15.0)
WBC: 9.2 10*3/uL (ref 3.8–10.8)

## 2016-09-06 LAB — BASIC METABOLIC PANEL
BUN: 15 mg/dL (ref 7–25)
CHLORIDE: 106 mmol/L (ref 98–110)
CO2: 23 mmol/L (ref 20–31)
Calcium: 8.8 mg/dL (ref 8.6–10.3)
Creat: 1.05 mg/dL (ref 0.60–1.35)
GLUCOSE: 88 mg/dL (ref 65–99)
POTASSIUM: 3.7 mmol/L (ref 3.5–5.3)
Sodium: 143 mmol/L (ref 135–146)

## 2016-09-13 ENCOUNTER — Encounter (HOSPITAL_COMMUNITY)
Admission: RE | Disposition: A | Discharge status: Home / Self Care | Payer: Self-pay | Source: Ambulatory Visit | Attending: Cardiology

## 2016-09-13 ENCOUNTER — Encounter: Payer: Self-pay | Admitting: Internal Medicine

## 2016-09-13 ENCOUNTER — Ambulatory Visit (HOSPITAL_COMMUNITY)
Admission: RE | Admit: 2016-09-13 | Discharge: 2016-09-13 | Disposition: A | Discharge status: Home / Self Care | Payer: BLUE CROSS/BLUE SHIELD | Source: Ambulatory Visit | Attending: Cardiology | Admitting: Cardiology

## 2016-09-13 ENCOUNTER — Telehealth: Payer: Self-pay | Admitting: Internal Medicine

## 2016-09-13 ENCOUNTER — Other Ambulatory Visit: Payer: Self-pay | Admitting: *Deleted

## 2016-09-13 ENCOUNTER — Ambulatory Visit (HOSPITAL_COMMUNITY): Payer: BLUE CROSS/BLUE SHIELD | Admitting: Certified Registered Nurse Anesthetist

## 2016-09-13 ENCOUNTER — Encounter (HOSPITAL_COMMUNITY): Payer: Self-pay | Admitting: Certified Registered Nurse Anesthetist

## 2016-09-13 DIAGNOSIS — Z87891 Personal history of nicotine dependence: Secondary | ICD-10-CM | POA: Insufficient documentation

## 2016-09-13 DIAGNOSIS — Z79899 Other long term (current) drug therapy: Secondary | ICD-10-CM | POA: Insufficient documentation

## 2016-09-13 DIAGNOSIS — K219 Gastro-esophageal reflux disease without esophagitis: Secondary | ICD-10-CM | POA: Diagnosis not present

## 2016-09-13 DIAGNOSIS — I4819 Other persistent atrial fibrillation: Secondary | ICD-10-CM

## 2016-09-13 DIAGNOSIS — Z96651 Presence of right artificial knee joint: Secondary | ICD-10-CM | POA: Insufficient documentation

## 2016-09-13 DIAGNOSIS — I481 Persistent atrial fibrillation: Secondary | ICD-10-CM | POA: Diagnosis not present

## 2016-09-13 DIAGNOSIS — Z7901 Long term (current) use of anticoagulants: Secondary | ICD-10-CM | POA: Diagnosis not present

## 2016-09-13 DIAGNOSIS — I4891 Unspecified atrial fibrillation: Secondary | ICD-10-CM | POA: Diagnosis not present

## 2016-09-13 DIAGNOSIS — M25561 Pain in right knee: Secondary | ICD-10-CM | POA: Diagnosis not present

## 2016-09-13 DIAGNOSIS — I10 Essential (primary) hypertension: Secondary | ICD-10-CM | POA: Insufficient documentation

## 2016-09-13 HISTORY — PX: CARDIOVERSION: SHX1299

## 2016-09-13 LAB — POCT I-STAT 4, (NA,K, GLUC, HGB,HCT)
Glucose, Bld: 91 mg/dL (ref 65–99)
HCT: 40 % (ref 39.0–52.0)
HEMOGLOBIN: 13.6 g/dL (ref 13.0–17.0)
POTASSIUM: 2.9 mmol/L — AB (ref 3.5–5.1)
SODIUM: 144 mmol/L (ref 135–145)

## 2016-09-13 SURGERY — CARDIOVERSION
Anesthesia: General

## 2016-09-13 MED ORDER — PROPOFOL 10 MG/ML IV BOLUS
INTRAVENOUS | Status: DC | PRN
  Administered 2016-09-13: 100 mg via INTRAVENOUS
  Administered 2016-09-13: 40 mg via INTRAVENOUS

## 2016-09-13 MED ORDER — LIDOCAINE 2% (20 MG/ML) 5 ML SYRINGE
INTRAMUSCULAR | Status: DC | PRN
  Administered 2016-09-13: 100 mg via INTRAVENOUS

## 2016-09-13 MED ORDER — SODIUM CHLORIDE 0.9 % IV SOLN
INTRAVENOUS | Status: DC | PRN
  Administered 2016-09-13: 11:00:00 via INTRAVENOUS

## 2016-09-13 MED ORDER — POTASSIUM CHLORIDE CRYS ER 20 MEQ PO TBCR: 20.0000 meq | EXTENDED_RELEASE_TABLET | Freq: Once | ORAL | 0 refills | Status: DC

## 2016-09-13 MED ORDER — POTASSIUM CHLORIDE CRYS ER 20 MEQ PO TBCR
20.0000 meq | EXTENDED_RELEASE_TABLET | Freq: Once | ORAL | Status: AC
  Administered 2016-09-13: 20 meq via ORAL
  Filled 2016-09-13: qty 1

## 2016-09-13 NOTE — Discharge Instructions (Signed)
Monitored Anesthesia Care, Care After These instructions provide you with information about caring for yourself after your procedure. Your health care provider may also give you more specific instructions. Your treatment has been planned according to current medical practices, but problems sometimes occur. Call your health care provider if you have any problems or questions after your procedure. What can I expect after the procedure? After your procedure, it is common to:  Feel sleepy for several hours.  Feel clumsy and have poor balance for several hours.  Feel forgetful about what happened after the procedure.  Have poor judgment for several hours.  Feel nauseous or vomit.  Have a sore throat if you had a breathing tube during the procedure. Follow these instructions at home: For at least 24 hours after the procedure:   Do not:  Participate in activities in which you could fall or become injured.  Drive.  Use heavy machinery.  Drink alcohol.  Take sleeping pills or medicines that cause drowsiness.  Make important decisions or sign legal documents.  Take care of children on your own.  Rest. Eating and drinking  Follow the diet that is recommended by your health care provider.  If you vomit, drink water, juice, or soup when you can drink without vomiting.  Make sure you have little or no nausea before eating solid foods. General instructions  Have a responsible adult stay with you until you are awake and alert.  Take over-the-counter and prescription medicines only as told by your health care provider.  If you smoke, do not smoke without supervision.  Keep all follow-up visits as told by your health care provider. This is important. Contact a health care provider if:  You keep feeling nauseous or you keep vomiting.  You feel light-headed.  You develop a rash.  You have a fever. Get help right away if:  You have trouble breathing. This information is not  intended to replace advice given to you by your health care provider. Make sure you discuss any questions you have with your health care provider. Document Released: 02/04/2016 Document Revised: 06/05/2016 Document Reviewed: 02/04/2016 Elsevier Interactive Patient Education  2017 [employer]. Electrical Cardioversion, Care After This sheet gives you information about how to care for yourself after your procedure. Your health care provider may also give you more specific instructions. If you have problems or questions, contact your health care provider. What can I expect after the procedure? After the procedure, it is common to have:  Some redness on the skin where the shocks were given. Follow these instructions at home:  Do not drive for 24 hours if you were given a medicine to help you relax (sedative).  Take over-the-counter and prescription medicines only as told by your health care provider.  Ask your health care provider how to check your pulse. Check it often.  Rest for 48 hours after the procedure or as told by your health care provider.  Avoid or limit your caffeine use as told by your health care provider. Contact a health care provider if:  You feel like your heart is beating too quickly or your pulse is not regular.  You have a serious muscle cramp that does not go away. Get help right away if:  You have discomfort in your chest.  You are dizzy or you feel faint.  You have trouble breathing or you are short of breath.  Your speech is slurred.  You have trouble moving an arm or leg on one side  of your body.  Your fingers or toes turn cold or blue. This information is not intended to replace advice given to you by your health care provider. Make sure you discuss any questions you have with your health care provider. Document Released: 08/04/2013 Document Revised: 05/17/2016 Document Reviewed: 04/19/2016 Elsevier Interactive Patient Education  2017 [employer].

## 2016-09-13 NOTE — Anesthesia Preprocedure Evaluation (Addendum)
Anesthesia Evaluation    Airway Mallampati: II  TM Distance: >3 FB Neck ROM: Full    Dental no notable dental hx. (+) Teeth Intact, Dental Advisory Given   Pulmonary former smoker,    Pulmonary exam normal breath sounds clear to auscultation       Cardiovascular hypertension, Normal cardiovascular exam+ dysrhythmias Atrial Fibrillation  Rhythm:Regular Rate:Normal     Neuro/Psych    GI/Hepatic   Endo/Other    Renal/GU      Musculoskeletal   Abdominal   Peds  Hematology   Anesthesia Other Findings   Reproductive/Obstetrics                            Anesthesia Physical Anesthesia Plan  ASA: II  Anesthesia Plan: General   Post-op Pain Management:    Induction: Intravenous  Airway Management Planned: Mask  Additional Equipment:   Intra-op Plan:   Post-operative Plan:   Informed Consent: I have reviewed the patients History and Physical, chart, labs and discussed the procedure including the risks, benefits and alternatives for the proposed anesthesia with the patient or authorized representative who has indicated his/her understanding and acceptance.   Dental advisory given  Plan Discussed with: CRNA  Anesthesia Plan Comments:         Anesthesia Quick Evaluation

## 2016-09-13 NOTE — Progress Notes (Signed)
Patient notified to come Wednesday AM for labs for Dr. Curt Bears (BMET).  Order placed, appointment made.

## 2016-09-13 NOTE — H&P (Signed)
ID:  Gregory Barrett, DOB April 07, 1971, MRN [id number]  PCP:  [name], MD Primary Electrophysiologist:  Will Meredith Leeds, MD           Chief Complaint  Patient presents with  . New Patient (Initial Visit)    new Afib  . Chest Pain  . Shortness of Breath  . Dizziness     History of Present Illness: Gregory Barrett is a 45 y.o. male who presents today for electrophysiology evaluation.   History of hypertension who presented to his PCP with syncope and newly diagnosed atrial fibrillation. Syncope was at work.  Was leaning over and felt dizzy.  Woke up and was on oxygen and being tended to by EMS.  Lost off consciousness for ~5 minutes. No seizure activity. At the time, per the patient, he was hypotensive and possibly bradycardic. He states that when he had the episode of syncope that he was sick with a viral illness and had been feeling dizzy for a few days.  He was diagnosed with atrial fibrillation at that time.  Since that time, he has been having fatigue, SOB and palpitations.  He has not been as active as he did not know if activity would make things worse.  Per his wife, he does not sore or hold his breath when sleeping.  Prior to his viral illness, he was feeling well.   Today, he denies symptoms of chest pain,  orthopnea, PND, lower extremity edema, claudication, dizziness, presyncope, syncope, bleeding, or neurologic sequela. The patient is tolerating medications without difficulties and is otherwise without complaint today.        Past Medical History:  Diagnosis Date  . Arthritis    legs, back   . GERD (gastroesophageal reflux disease)   . Hypertension   . Migraine         Past Surgical History:  Procedure Laterality Date  . HARDWARE REMOVAL Right 12/12/2015   Procedure: HARDWARE REMOVAL RIGHT TIBIAL;  Surgeon: Altamese Rayle, MD;  Location: [facility];  Service: Orthopedics;  Laterality: Right;  . KNEE SURGERY     right  . TIBIA FRACTURE  SURGERY  YRS AGO   right  . TOTAL KNEE ARTHROPLASTY Right 03/19/2016   Procedure: RIGHT TOTAL KNEE ARTHROPLASTY;  Surgeon: Paralee Cancel, MD;  Location: [facility];  Service: Orthopedics;  Laterality: Right;  Marland Kitchen VASECTOMY             Current Outpatient Prescriptions  Medication Sig Dispense Refill  . esomeprazole (NEXIUM) 20 MG capsule Take 20 mg by mouth daily before breakfast.     . lisinopril (PRINIVIL,ZESTRIL) 10 MG tablet Take 1 tablet (10 mg total) by mouth daily. 90 tablet 3  . metoprolol succinate (TOPROL-XL) 50 MG 24 hr tablet Take 1 tablet (50 mg total) by mouth daily. Take with or immediately following a meal. 90 tablet 3  . rivaroxaban (XARELTO) 20 MG TABS tablet Take 1 tablet (20 mg total) by mouth daily with supper. 30 tablet 0  . rivaroxaban (XARELTO) 20 MG TABS tablet Take 1 tablet (20 mg total) by mouth daily with supper. 30 tablet 6   No current facility-administered medications for this visit.     Allergies:   Review of patient's allergies indicates no known allergies.   Social History:  The patient  reports that he has quit smoking. He has quit using smokeless tobacco. His smokeless tobacco use included Chew. He reports that he drinks alcohol. He reports that he does not use drugs.  Family History:  The patient's family history includes Cancer in his maternal grandfather; Hypertension in his maternal grandmother.    ROS:  Please see the history of present illness.   Otherwise, review of systems is positive for Chills, fatigue, chest pain, dyspnea on exertion, chest pressure, palpitations, muscle pain, dizziness.   All other systems are reviewed and negative.    PHYSICAL EXAM: VS:  BP (!) 136/98   Pulse 88   Ht 5\' 11"  (1.803 m)   Wt 288 lb 6.4 oz (130.8 kg)   BMI 40.22 kg/m  , BMI Body mass index is 40.22 kg/m. GEN: Well nourished, well developed, in no acute distress  HEENT: normal  Neck: no JVD, carotid bruits, or masses Cardiac: iRRR; no  murmurs, rubs, or gallops,no edema  Respiratory:  clear to auscultation bilaterally, normal work of breathing GI: soft, nontender, nondistended, + BS MS: no deformity or atrophy  Skin: warm and dry Neuro:  Strength and sensation are intact Psych: euthymic mood, full affect  EKG:  EKG is ordered today. Personal review of the ekg ordered shows atrial fibrillation, rate 88  Recent Labs: 12/11/2015: ALT 34 08/06/2016: BUN 16; Creatinine, Ser 1.09; Hemoglobin 15.2; Platelets 302.0; Potassium 3.3; Sodium 142; TSH 2.15    Lipid Panel  Labs (Brief)          Component Value Date/Time   CHOL 168 07/01/2014 1019   TRIG 77.0 07/01/2014 1019   HDL 38.00 (L) 07/01/2014 1019   CHOLHDL 4 07/01/2014 1019   VLDL 15.4 07/01/2014 1019   LDLCALC 115 (H) 07/01/2014 1019          Wt Readings from Last 3 Encounters:  08/12/16 288 lb 6.4 oz (130.8 kg)  08/06/16 287 lb (130.2 kg)  03/19/16 299 lb (135.6 kg)      Other studies Reviewed: Additional studies/ records that were reviewed today include: PCP notes  ASSESSMENT AND PLAN:  1. Persistent atrial fibrillation: Has had atrial fibrlliaotn since a viral illness.  Since that time has had symptoms of fatigue and SOB.  Due to that, would prefer a rhythm control approach.  He has not yet had a TTE and thus will order today.  He has a low stroke risk but as we are planning a rhythm control approach will start Xarelto. Will plan for cardioversion 3 weeks after initiation of Xarelto. Once he has had 3 weeks of Xarelto, will plan to start Flecainide if his TTE shows no evidence of structural heart disease.  We did discuss ablation, but he is not interested in procedures at this time.  This patients CHA2DS2-VASc Score and unadjusted Ischemic Stroke Rate (% per year) is equal to 0.6 % stroke rate/year from a score of 1  Above score calculated as 1 point each if present [CHF, HTN, DM, Vascular=MI/PAD/Aortic Plaque, Age if 65-74, or  Male] Above score calculated as 2 points each if present [Age > 75, or Stroke/TIA/TE]  2. Hypertension: well controlled  3. Syncope: per history, happened when he was sick with a viral illness.  Due to that, does not require driving restrictions as it was likely due to his viral illness.  He is aware that if syncope occurs again, he would not be able to drive and would need further evaluation for secondary rhythm issues.    Current medicines are reviewed at length with the patient today.   The patient does not have concerns regarding his medicines.  The following changes were made today:  Xarelto  Labs/ tests  ordered today include: TTE    Orders Placed This Encounter  Procedures  . EKG 12-Lead     Disposition:   FU with Will Camnitz 3 months  Signed, Will Meredith Leeds, MD  08/12/2016 9:02 PM

## 2016-09-13 NOTE — Telephone Encounter (Signed)
Called [facility] and spoke with Vivien Rota and advised that per Dr. Alan Ripper notes, it does appear she intended to give patient only 1 Kdur for low K+ at time of cardioversion.  Vivien Rota thanked me for the call.

## 2016-09-13 NOTE — Anesthesia Procedure Notes (Signed)
Date/Time: 09/13/2016 11:05 AM Performed by: Willeen Cass P Pre-anesthesia Checklist: Patient identified, Emergency Drugs available, Suction available, Timeout performed and Patient being monitored Patient Re-evaluated:Patient Re-evaluated prior to inductionOxygen Delivery Method: Ambu bag Preoxygenation: Pre-oxygenation with 100% oxygen Intubation Type: IV induction Ventilation: Mask ventilation without difficulty Dental Injury: Teeth and Oropharynx as per pre-operative assessment

## 2016-09-13 NOTE — Progress Notes (Signed)
Patient needs BMET scheduled for Wed AM  Make stat [address]  This needs to be routed to [facility]  F/U for possible flecanide

## 2016-09-13 NOTE — Progress Notes (Unsigned)
Patient set up for cardioversion today  Successful K noted to be 2.9  Supplement given and sent home on KCL in addition to lisinopril Will need to have followed with BMET  And work up of possible hyperaldosteronism.

## 2016-09-13 NOTE — Anesthesia Postprocedure Evaluation (Signed)
Anesthesia Post Note  Patient: Gregory Barrett  Procedure(s) Performed: Procedure(s) (LRB): CARDIOVERSION (N/A)  Patient location during evaluation: Endoscopy Anesthesia Type: MAC Level of consciousness: awake and alert Pain management: pain level controlled Vital Signs Assessment: post-procedure vital signs reviewed and stable Respiratory status: spontaneous breathing, nonlabored ventilation, respiratory function stable and patient connected to nasal cannula oxygen Cardiovascular status: stable and blood pressure returned to baseline Anesthetic complications: no    Last Vitals:  Vitals:   09/13/16 1135 09/13/16 1140  BP: (!) 143/104 (!) 139/103  Pulse: 78   Resp: 19   Temp:      Last Pain:  Vitals:   09/13/16 1115  TempSrc: Oral                 [name]

## 2016-09-13 NOTE — Op Note (Signed)
Patient anesthetized by anesthesia with Propofol With pads in AP position cardioversion attempted with 200 J synchronized biphasic energy.  Unsuccessful Pads moved to Apex/base position  With 200 J synchronized biphasic energy pt successfully cardioverted to SR. Procedure without complication  Pt to be given 20 mEq KCL Will be sent home on 20 mEq KCL every day

## 2016-09-13 NOTE — Telephone Encounter (Signed)
Gregory Barrett ( [place] ) is calling for clarification on  Klor- Kon (potassium) , was sent over for 1 tablet and no refills , please call   Thanks

## 2016-09-13 NOTE — Transfer of Care (Signed)
Immediate Anesthesia Transfer of Care Note  Patient: Gregory Barrett  Procedure(s) Performed: Procedure(s): CARDIOVERSION (N/A)  Patient Location: PACU and Endoscopy Unit  Anesthesia Type:General  Level of Consciousness: awake, oriented and patient cooperative  Airway & Oxygen Therapy: Patient Spontanous Breathing and Patient connected to nasal cannula oxygen  Post-op Assessment: Report given to RN and Post -op Vital signs reviewed and stable  Post vital signs: Reviewed and stable  Last Vitals:  Vitals:   09/13/16 1039  BP: (!) 158/113  Pulse: 99  Resp: 11  Temp: 37.1 C    Last Pain:  Vitals:   09/13/16 1039  TempSrc: Oral         Complications: No apparent anesthesia complications

## 2016-09-16 ENCOUNTER — Encounter (HOSPITAL_COMMUNITY): Payer: Self-pay | Admitting: Internal Medicine

## 2016-09-16 ENCOUNTER — Telehealth: Payer: Self-pay | Admitting: Internal Medicine

## 2016-09-16 DIAGNOSIS — M79604 Pain in right leg: Secondary | ICD-10-CM | POA: Diagnosis not present

## 2016-09-16 DIAGNOSIS — B9789 Other viral agents as the cause of diseases classified elsewhere: Secondary | ICD-10-CM | POA: Diagnosis not present

## 2016-09-16 DIAGNOSIS — J069 Acute upper respiratory infection, unspecified: Secondary | ICD-10-CM | POA: Diagnosis not present

## 2016-09-16 NOTE — Progress Notes (Signed)
Please schedule ROV  within the next week

## 2016-09-16 NOTE — Telephone Encounter (Signed)
Error

## 2016-09-17 ENCOUNTER — Other Ambulatory Visit: Payer: Self-pay | Admitting: Cardiology

## 2016-09-17 ENCOUNTER — Ambulatory Visit (INDEPENDENT_AMBULATORY_CARE_PROVIDER_SITE_OTHER): Payer: BLUE CROSS/BLUE SHIELD | Admitting: Internal Medicine

## 2016-09-17 ENCOUNTER — Encounter: Payer: Self-pay | Admitting: Internal Medicine

## 2016-09-17 VITALS — BP 138/100 | HR 87 | Temp 98.1°F | Ht 71.0 in | Wt 287.8 lb

## 2016-09-17 DIAGNOSIS — I10 Essential (primary) hypertension: Secondary | ICD-10-CM | POA: Diagnosis not present

## 2016-09-17 DIAGNOSIS — I480 Paroxysmal atrial fibrillation: Secondary | ICD-10-CM | POA: Diagnosis not present

## 2016-09-17 DIAGNOSIS — M79604 Pain in right leg: Secondary | ICD-10-CM | POA: Diagnosis not present

## 2016-09-17 MED ORDER — METOPROLOL SUCCINATE ER 100 MG PO TB24: 100.0000 mg | ORAL_TABLET | Freq: Every day | ORAL | 3 refills | Status: DC

## 2016-09-17 MED ORDER — LISINOPRIL 40 MG PO TABS: 40.0000 mg | ORAL_TABLET | Freq: Every day | ORAL | 2 refills | Status: DC

## 2016-09-17 MED ORDER — RIVAROXABAN 20 MG PO TABS: 20.0000 mg | ORAL_TABLET | Freq: Every day | ORAL | 3 refills | Status: DC

## 2016-09-17 MED ORDER — SPIRONOLACTONE 25 MG PO TABS: 25.0000 mg | ORAL_TABLET | Freq: Every day | ORAL | 3 refills | Status: DC

## 2016-09-17 NOTE — Progress Notes (Signed)
Pt is already scheduled for today should he reschedule

## 2016-09-17 NOTE — Progress Notes (Unsigned)
Please call pt and schedule appt this week per Dr.K. 

## 2016-09-17 NOTE — Progress Notes (Signed)
Pre visit review using our clinic review tool, if applicable. No additional management support is needed unless otherwise documented below in the visit note. 

## 2016-09-17 NOTE — Progress Notes (Signed)
Okay to keep for today's appointment.

## 2016-09-17 NOTE — Progress Notes (Signed)
Subjective:    Patient ID: Gregory Barrett, male    DOB: 1971-03-09, 45 y.o.   MRN: [id number]  HPI  BP Readings from Last 3 Encounters:  09/17/16 (!) 138/100  09/13/16 (!) 139/103  08/13/16 73/47   45 year old patient who has a history of hypertension.  He has been seen by cardiology recently and is status post cardioversion for atrial fibrillation.  He was seen at the urgent care recently with increasing swelling involving his right leg and is scheduled for a venous Doppler later this afternoon. Cardiology raises the possibility of primary hyperaldosteronism due to his history of unprovoked hypokalemia.  He has had some low potassium levels since discontinuation of diuretic therapy. Blood pressure over the past 4 weeks has become quite elevated  Past Medical History:  Diagnosis Date  . Arthritis    legs, back   . GERD (gastroesophageal reflux disease)   . Hypertension   . Migraine      Social History   Social History  . Marital status: Married    Spouse name: N/A  . Number of children: N/A  . Years of education: N/A   Occupational History  . Not on file.   Social History Main Topics  . Smoking status: Former Research scientist (life sciences)  . Smokeless tobacco: Former Systems developer    Types: Chew  . Alcohol use Yes     Comment: occasional glass of wine   . Drug use: No  . Sexual activity: Not on file   Other Topics Concern  . Not on file   Social History Narrative  . No narrative on file    Past Surgical History:  Procedure Laterality Date  . CARDIOVERSION N/A 09/13/2016   Procedure: CARDIOVERSION;  Surgeon: Fay Records, MD;  Location: [facility];  Service: Cardiovascular;  Laterality: N/A;  . HARDWARE REMOVAL Right 12/12/2015   Procedure: HARDWARE REMOVAL RIGHT TIBIAL;  Surgeon: Altamese Clyde, MD;  Location: [facility];  Service: Orthopedics;  Laterality: Right;  . KNEE SURGERY     right  . TIBIA FRACTURE SURGERY  YRS AGO   right  . TOTAL KNEE ARTHROPLASTY Right 03/19/2016   Procedure:  RIGHT TOTAL KNEE ARTHROPLASTY;  Surgeon: Paralee Cancel, MD;  Location: [facility];  Service: Orthopedics;  Laterality: Right;  Marland Kitchen VASECTOMY      Family History  Problem Relation Age of Onset  . Hypertension Maternal Grandmother   . Cancer Maternal Grandfather     lung ca    No Known Allergies  Current Outpatient Prescriptions on File Prior to Visit  Medication Sig Dispense Refill  . esomeprazole (NEXIUM) 20 MG capsule Take 20 mg by mouth daily before breakfast.     . lisinopril (PRINIVIL,ZESTRIL) 10 MG tablet Take 1 tablet (10 mg total) by mouth daily. 90 tablet 3  . metoprolol succinate (TOPROL-XL) 50 MG 24 hr tablet Take 1 tablet (50 mg total) by mouth daily. Take with or immediately following a meal. 90 tablet 3  . rivaroxaban (XARELTO) 20 MG TABS tablet Take 1 tablet (20 mg total) by mouth daily with supper. 30 tablet 0  . potassium chloride SA (K-DUR,KLOR-CON) 20 MEQ tablet Take 1 tablet (20 mEq total) by mouth once. 1 tablet 0   No current facility-administered medications on file prior to visit.     BP (!) 138/100 (BP Location: Left Arm, Patient Position: Sitting, Cuff Size: Large)   Pulse 87   Temp 98.1 F (36.7 C) (Oral)   Ht 5\' 11"  (1.803 m)  Wt 287 lb 12.8 oz (130.5 kg)   SpO2 98%   BMI 40.14 kg/m     Review of Systems  Constitutional: Negative for appetite change, chills, fatigue and fever.  HENT: Negative for congestion, dental problem, ear pain, hearing loss, sore throat, tinnitus, trouble swallowing and voice change.   Eyes: Negative for pain, discharge and visual disturbance.  Respiratory: Negative for cough, chest tightness, wheezing and stridor.   Cardiovascular: Positive for leg swelling. Negative for chest pain and palpitations.  Gastrointestinal: Negative for abdominal distention, abdominal pain, blood in stool, constipation, diarrhea, nausea and vomiting.  Genitourinary: Negative for difficulty urinating, discharge, flank pain, genital sores, hematuria and  urgency.  Musculoskeletal: Negative for arthralgias, back pain, gait problem, joint swelling, myalgias and neck stiffness.  Skin: Negative for rash.  Neurological: Negative for dizziness, syncope, speech difficulty, weakness, numbness and headaches.  Hematological: Negative for adenopathy. Does not bruise/bleed easily.  Psychiatric/Behavioral: Negative for behavioral problems and dysphoric mood. The patient is not nervous/anxious.        Objective:   Physical Exam  Constitutional: He appears well-developed and well-nourished. No distress.  Blood pressure 170/100  Cardiovascular: Regular rhythm.   Musculoskeletal: He exhibits edema.  Swelling right leg distal to the knee          Assessment & Plan:   Hypertension, poorly controlled.  History of unprovoked hypokalemia off diuretic therapy raises concern for primary hyperaldosteronism.  Situation discussed with the patient at length. He is aware that he must be off medications that affect the RAS, such as Ace inhibitors and aldosterone antagonists  and that a blood draw for PAC/PRA must be an early morning sample.  He also states that he would prefer medical treatment rather than surgical intervention if this diagnosis was confirmed.  He also is anxious to control his hypertension at this time. We'll increase lisinopril to 40 mg daily; add spironolactone 25 mg once daily and continue 100 mg of metoprolol Recheck 4 weeks.  Patient will return in 6 days to check a serum potassium level.  Potassium supplements will be discontinued Continue home blood pressure monitoring Will consider evaluation for primary hyperaldosteronism in the future, especially if blood pressure is difficult to control.  In view of recent atrial fib and poorly  controlled hypertension, will defer testing at this time Rule out right leg DVT.  Venous Doppler study scheduled (  Patient on Xarelto  due to A. Fib)  [name]

## 2016-09-17 NOTE — Patient Instructions (Addendum)
Limit your sodium (Salt) intake  Discontinue potassium supplementation  Return Monday to check electrolytes  Please check your blood pressure on a regular basis.  If it is consistently greater than 150/90, please make an office appointment.  Return in one month for follow-up  Venous Doppler study of the right leg as scheduled

## 2016-09-18 ENCOUNTER — Other Ambulatory Visit: Payer: BLUE CROSS/BLUE SHIELD

## 2016-09-23 ENCOUNTER — Other Ambulatory Visit (INDEPENDENT_AMBULATORY_CARE_PROVIDER_SITE_OTHER): Payer: BLUE CROSS/BLUE SHIELD

## 2016-09-23 DIAGNOSIS — I10 Essential (primary) hypertension: Secondary | ICD-10-CM | POA: Diagnosis not present

## 2016-09-24 LAB — BASIC METABOLIC PANEL
BUN: 20 mg/dL (ref 6–23)
CHLORIDE: 102 meq/L (ref 96–112)
CO2: 29 meq/L (ref 19–32)
CREATININE: 1.15 mg/dL (ref 0.40–1.50)
Calcium: 9.3 mg/dL (ref 8.4–10.5)
GFR: 73.77 mL/min (ref >60.00)
Glucose, Bld: 73 mg/dL (ref 70–99)
POTASSIUM: 4 meq/L (ref 3.5–5.1)
SODIUM: 140 meq/L (ref 135–145)

## 2016-09-26 ENCOUNTER — Telehealth: Payer: Self-pay | Admitting: Internal Medicine

## 2016-09-26 NOTE — Telephone Encounter (Signed)
Pt was told by Earlene Plater per me to call his Cardiologist and see them today due to Dr.K not in office today. Earlene Plater said pt verbalized understanding but wanted to let Dr. Raliegh Ip know.

## 2016-09-26 NOTE — Telephone Encounter (Signed)
Apologized to pt that I had not gotten in touch with him sooner and explained I have been out on FMLA since beginning of the month.  He was understanding about situation. He tells me he has been doing good up until today -- back in AFib.  He is scheduled to see Renee tomorrow morning to discuss and make a new plan.   He understands I will follow up with him tomorrow morning while he is in office.  He thanks me for calling

## 2016-09-26 NOTE — Telephone Encounter (Signed)
Pt calling to let Dr. Raliegh Ip know that he is in Afib he went to the clinic at work and they told him to see his primary care provider.

## 2016-09-27 ENCOUNTER — Ambulatory Visit (INDEPENDENT_AMBULATORY_CARE_PROVIDER_SITE_OTHER): Payer: BLUE CROSS/BLUE SHIELD | Admitting: Physician Assistant

## 2016-09-27 ENCOUNTER — Encounter: Payer: Self-pay | Admitting: *Deleted

## 2016-09-27 VITALS — BP 110/84 | HR 84 | Ht 71.0 in | Wt 283.0 lb

## 2016-09-27 DIAGNOSIS — I4891 Unspecified atrial fibrillation: Secondary | ICD-10-CM

## 2016-09-27 DIAGNOSIS — I10 Essential (primary) hypertension: Secondary | ICD-10-CM

## 2016-09-27 MED ORDER — FLECAINIDE ACETATE 50 MG PO TABS: 50.0000 mg | ORAL_TABLET | Freq: Two times a day (BID) | ORAL | 4 refills | Status: DC

## 2016-09-27 NOTE — Telephone Encounter (Signed)
Spoke to pt, just following up on call from yesterday cause I did not see that you called them but I see you saw Cardiology today. Pt said yes and he did talk to them yesterday and is now scheduled for another Cardioversion next week and was going to call and let Dr.K know. Told him I will tell Dr.K and any problems please call Cardiology. Pt verbalized understanding. Dr.K notified.

## 2016-09-27 NOTE — Patient Instructions (Addendum)
Medication Instructions:   START TAKING FLECAINIDE  50 MG TWICE A DAY   If you need a refill on your cardiac medications before your next appointment, please call your pharmacy.  Labwork: NONE ORDERED  TODAY    Testing/Procedures: NONE ORDERED  TODAY    Follow-Up: EKG WITH RENEE   Monday AFTER 10/02/16  POST  DCCV PROCEDURE   FOLLOW UP WITH CAMNITZ IN 2 WEEKS AFTER 10/02/16..F NOT AVAILABLE MESSAGE MELISSA  Any Other Special Instructions Will Be Listed Below (If Applicable).

## 2016-09-27 NOTE — Progress Notes (Signed)
Cardiology Office Note Date:  09/27/2016  Patient ID:  Gregory Barrett, Gregory Barrett 1970-12-27, MRN [id number] PCP:  [name], MD  Cardiologist:  Dr. Curt Bears   Chief Complaint: planned f/u  History of Present Illness: Gregory Barrett is a 45 y.o. male with history of HTN, faily recent finding of AFib and returns today for f/u evaluation.  He is being seen for Dr. Curt Bears.  He was last seen by him in Oct with plans to get DCCV, TTE and return to discuss possible AAD.  He underwent DCCV 09/13/16, was noted to be hypokalemic and started on K+ and his PMD has discussed evaluation for hyperaldosteronism, though holding off at this time, and haa been adjusting meds for better BP control.  He is feeling well, though now having been out of AF a couple weeks and being back in AF, he can definitely tell the difference in his energy level.  He knew the morning of the 29th he woke that he was out of rhythm.  He denies any CP SOB, no dizziness, near syncope or syncope.  He denies any bleeding or signs of bleeding/  He has not missed any dose of the Xarelto since was started in October.  His K+ 09/23/16 was up to 4.0, he states the plan with his PMD in regards to evaluating for hypoaldosteronism is that should he have trouble with keeping his K+ and BP managed they would pursue testing.  His BP is much better today though he states not until he went back in AF, the morning in AF his BP was normal.   AF Hx: Diagnosed Oct 2017 DCCV 09/13/16  Past Medical History:  Diagnosis Date  . Arthritis    legs, back   . GERD (gastroesophageal reflux disease)   . Hypertension   . Migraine     Past Surgical History:  Procedure Laterality Date  . CARDIOVERSION N/A 09/13/2016   Procedure: CARDIOVERSION;  Surgeon: Fay Records, MD;  Location: [facility];  Service: Cardiovascular;  Laterality: N/A;  . HARDWARE REMOVAL Right 12/12/2015   Procedure: HARDWARE REMOVAL RIGHT TIBIAL;  Surgeon: Altamese , MD;   Location: [place];  Service: Orthopedics;  Laterality: Right;  . KNEE SURGERY     right  . TIBIA FRACTURE SURGERY  YRS AGO   right  . TOTAL KNEE ARTHROPLASTY Right 03/19/2016   Procedure: RIGHT TOTAL KNEE ARTHROPLASTY;  Surgeon: Paralee Cancel, MD;  Location: [facility];  Service: Orthopedics;  Laterality: Right;  Marland Kitchen VASECTOMY      Current Outpatient Prescriptions  Medication Sig Dispense Refill  . esomeprazole (NEXIUM) 20 MG capsule Take 20 mg by mouth daily before breakfast.     . lisinopril (PRINIVIL,ZESTRIL) 40 MG tablet Take 1 tablet (40 mg total) by mouth daily. 90 tablet 2  . metoprolol succinate (TOPROL-XL) 100 MG 24 hr tablet Take 1 tablet (100 mg total) by mouth daily. Take with or immediately following a meal. 90 tablet 3  . rivaroxaban (XARELTO) 20 MG TABS tablet Take 1 tablet (20 mg total) by mouth daily with supper. 90 tablet 3  . spironolactone (ALDACTONE) 25 MG tablet Take 1 tablet (25 mg total) by mouth daily. 90 tablet 3  . flecainide (TAMBOCOR) 50 MG tablet Take 1 tablet (50 mg total) by mouth 2 (two) times daily. 60 tablet 4   No current facility-administered medications for this visit.     Allergies:   Patient has no known allergies.   Social History:  The patient  reports that he has quit smoking. He has quit using smokeless tobacco. His smokeless tobacco use included Chew. He reports that he drinks alcohol. He reports that he does not use drugs.   Family History:  The patient's family history includes Cancer in his maternal grandfather; Hypertension in his maternal grandmother.  ROS:  Please see the history of present illness.   All other systems are reviewed and otherwise negative.   PHYSICAL EXAM:  VS:  BP 110/84   Pulse 84   Ht 5\' 11"  (1.803 m)   Wt 283 lb (128.4 kg)   BMI 39.47 kg/m  BMI: Body mass index is 39.47 kg/m. Well nourished, well developed, in no acute distress  HEENT: normocephalic, atraumatic  Neck: no JVD, carotid bruits or masses Cardiac:   IRRR; no significant murmurs, no rubs, or gallops Lungs:  clear to auscultation bilaterally, no wheezing, rhonchi or rales  Abd: soft, nontender MS: no deformity or atrophy Ext: no edema  Skin: warm and dry, no rash Neuro:  No gross deficits appreciated Psych: euthymic mood, full affect   EKG:  Done today and reviewed by myself is AFib, QRS 61ms 09/13/16: EKG is SR, PR 149ms, QRS 49ms. QTc 424ms  08/20/16 TTE Study Conclusions - Left ventricle: The cavity size was normal. Wall thickness was   increased in a pattern of mild LVH. Systolic function was normal.   The estimated ejection fraction was in the range of 50% to 55%.   Wall motion was normal; there were no regional wall motion   abnormalities. There was no evidence of elevated ventricular   filling pressure by Doppler parameters. - Aorta: Ascending aortic diameter: 43 mm (S). - Ascending aorta: The ascending aorta was mildly dilated. - Left atrium: Volume/bsa, ES (1-plane Simpson&'s, A4C): 30.2   ml/m^2. - Right ventricle: The cavity size was mildly dilated. Wall   thickness was normal.    Recent Labs: 12/11/2015: ALT 34 08/06/2016: TSH 2.15 09/06/2016: Platelets 319 09/13/2016: Hemoglobin 13.6 09/23/2016: BUN 20; Creatinine, Ser 1.15; Potassium 4.0; Sodium 140  No results found for requested labs within last 8760 hours.   Estimated Creatinine Clearance: 113 mL/min (by C-G formula based on SCr of 1.15 mg/dL).   Wt Readings from Last 3 Encounters:  09/27/16 283 lb (128.4 kg)  09/17/16 287 lb 12.8 oz (130.5 kg)  09/13/16 288 lb (130.6 kg)     Other studies reviewed: Additional studies/records reviewed today include: summarized above  ASSESSMENT AND PLAN:  1. Persistent AF     CHA2DS2Vasc is at least 1, on xarelto     ERAF  2 weeks afterwards     No WMA on his echo, will start Flecainide 50mg  BID today, get an EKG Monday/Tuesday of next week     plan for DCCV ASAP since he doesn't feel great in AF     f/u with  Dr. Curt Bears in 2 weeks for close f/u and monitor his response to AAD tx     Pending this visit, will need plans for stress test pending his f/u visit  He denies symptoms of OSA, no snoring. We discussed life style strategies including avoiding stimulants, ETOH, and weight loss, exercise efforts.  2. HTN     Better today  3. Hypokalemia     wnl 11/27     PMD is managing, pedning possible w/u for hyperaldosteronism    Disposition: as above  Current medicines are reviewed at length with the patient today.  The patient did not have any  concerns regarding medicines.  Haywood Lasso, PA-C 09/27/2016 11:53 AM     [facility] [address] [place]  [phone] (office)  [phone] (fax)

## 2016-09-30 DIAGNOSIS — M549 Dorsalgia, unspecified: Secondary | ICD-10-CM | POA: Diagnosis not present

## 2016-09-30 DIAGNOSIS — M25571 Pain in right ankle and joints of right foot: Secondary | ICD-10-CM | POA: Diagnosis not present

## 2016-09-30 NOTE — Progress Notes (Signed)
Cardiology Office Note   Date:  10/01/2016   ID:  Gregory Barrett, DOB 11/07/1970, MRN [id number]  PCP:  [name], MD  Cardiologist:  Dr. Curt Bears    Chief Complaint  Patient presents with  . Atrial Fibrillation      History of Present Illness: Gregory Barrett is a 45 y.o. male who presents for EKG eval after beginning flecainide.    He has a history of HTN, fairly recent finding of AFib and returns today for f/u evaluation.  He is being seen for Dr. Curt Bears.  He was last seen by him in Oct with plans to get DCCV, TTE and return to discuss possible AAD.  He underwent DCCV 09/13/16, was noted to be hypokalemic and started on K+ and his PMD has discussed evaluation for hyperaldosteronism, though holding off at this time, and has been adjusting meds for better BP control.    Today BP is controlled but HR is slightly higher.  He has no chest pain and no SOB.  His EKG today reveals A fib with rate of 112.  His QRS mildly wider at 100 ms up from 90 ms.  Discussed with [employer].  Pt is scheduled for DCCV tomorrow.  He has been instructed on arrival time.     Past Medical History:  Diagnosis Date  . Arthritis    legs, back   . GERD (gastroesophageal reflux disease)   . Hypertension   . Migraine     Past Surgical History:  Procedure Laterality Date  . CARDIOVERSION N/A 09/13/2016   Procedure: CARDIOVERSION;  Surgeon: Fay Records, MD;  Location: [facility];  Service: Cardiovascular;  Laterality: N/A;  . HARDWARE REMOVAL Right 12/12/2015   Procedure: HARDWARE REMOVAL RIGHT TIBIAL;  Surgeon: Altamese Saxton, MD;  Location: [facility];  Service: Orthopedics;  Laterality: Right;  . KNEE SURGERY     right  . TIBIA FRACTURE SURGERY  YRS AGO   right  . TOTAL KNEE ARTHROPLASTY Right 03/19/2016   Procedure: RIGHT TOTAL KNEE ARTHROPLASTY;  Surgeon: Paralee Cancel, MD;  Location: [facility];  Service: Orthopedics;  Laterality: Right;  Marland Kitchen VASECTOMY       Current Outpatient Prescriptions    Medication Sig Dispense Refill  . cyclobenzaprine (FLEXERIL) 10 MG tablet Take 1 tablet by mouth as directed.  0  . esomeprazole (NEXIUM) 20 MG capsule Take 20 mg by mouth daily before breakfast.     . flecainide (TAMBOCOR) 50 MG tablet Take 1 tablet (50 mg total) by mouth 2 (two) times daily. 60 tablet 4  . lisinopril (PRINIVIL,ZESTRIL) 40 MG tablet Take 1 tablet (40 mg total) by mouth daily. 90 tablet 2  . metoprolol succinate (TOPROL-XL) 100 MG 24 hr tablet Take 1 tablet (100 mg total) by mouth daily. Take with or immediately following a meal. 90 tablet 3  . predniSONE (DELTASONE) 50 MG tablet Take 1 tablet by mouth as directed.  0  . rivaroxaban (XARELTO) 20 MG TABS tablet Take 1 tablet (20 mg total) by mouth daily with supper. 90 tablet 3  . spironolactone (ALDACTONE) 25 MG tablet Take 1 tablet (25 mg total) by mouth daily. 90 tablet 3   No current facility-administered medications for this visit.     Allergies:   Patient has no known allergies.    Social History:  The patient  reports that he has quit smoking. He has quit using smokeless tobacco. His smokeless tobacco use included Chew. He reports that he drinks alcohol. He reports  that he does not use drugs.   Family History:  The patient's family history includes Cancer in his maternal grandfather; Hypertension in his maternal grandmother.    ROS:  General:no colds or fevers, no weight changes Skin:no rashes or ulcers HEENT:no blurred vision, no congestion CV:see HPI PUL:see HPI GI:no diarrhea constipation or melena, no indigestion GU:no hematuria, no dysuria MS:no joint pain, no claudication Neuro:no syncope, no lightheadedness Endo:no diabetes, no thyroid disease  Wt Readings from Last 3 Encounters:  10/01/16 285 lb (129.3 kg)  09/27/16 283 lb (128.4 kg)  09/17/16 287 lb 12.8 oz (130.5 kg)     PHYSICAL EXAM: VS:  BP (!) 144/90 (BP Location: Right Arm, Patient Position: Sitting, Cuff Size: Large)   Pulse (!) 112    Ht 5\' 11"  (1.803 m)   Wt 285 lb (129.3 kg)   BMI 39.75 kg/m  , BMI Body mass index is 39.75 kg/m. General:Pleasant affect, NAD Skin:Warm and dry, brisk capillary refill HEENT:normocephalic, sclera clear, mucus membranes moist Neck:supple, no JVD, no bruits  Heart:irregular irregular without murmur, gallup, rub or click Lungs:clear without rales, rhonchi, or wheezes [id number], non tender, + BS, do not palpate liver spleen or masses Ext:no lower ext edema,  Neuro:alert and oriented, MAE, follows commands, + facial symmetry    EKG:  EKG is ordered today. The ekg ordered today demonstrates a fib with RVR and QRS at 100 ms up from 90 ms   Recent Labs: 12/11/2015: ALT 34 08/06/2016: TSH 2.15 09/06/2016: Platelets 319 09/13/2016: Hemoglobin 13.6 09/23/2016: BUN 20; Creatinine, Ser 1.15; Potassium 4.0; Sodium 140    Lipid Panel    Component Value Date/Time   CHOL 168 07/01/2014 1019   TRIG 77.0 07/01/2014 1019   HDL 38.00 (L) 07/01/2014 1019   CHOLHDL 4 07/01/2014 1019   VLDL 15.4 07/01/2014 1019   LDLCALC 115 (H) 07/01/2014 1019       Other studies Reviewed: Additional studies/ records that were reviewed today include:  ECHO 08/20/16  Study Conclusions  - Left ventricle: The cavity size was normal. Wall thickness was   increased in a pattern of mild LVH. Systolic function was normal.   The estimated ejection fraction was in the range of 50% to 55%.   Wall motion was normal; there were no regional wall motion   abnormalities. There was no evidence of elevated ventricular   filling pressure by Doppler parameters. - Aorta: Ascending aortic diameter: 43 mm (S). - Ascending aorta: The ascending aorta was mildly dilated. - Left atrium: Volume/bsa, ES (1-plane Simpson&'s, A4C): 30.2   ml/m^2. - Right ventricle: The cavity size was mildly dilated. Wall   thickness was normal.   ASSESSMENT AND PLAN:   1. Persistent AF     CHA2DS2Vasc is at least 1, on xarelto has not  missed any doses and is aware not to miss any.  If missed risk of stroke is greater..      to see Dr. Curt Bears nest week.     No WMA on his echo, now  Flecainide 50mg  BID today, get an EKG stable with A fib with RVR    plan for DCCV ASAP since he doesn't feel great in AF  10/02/16          will need plans for stress test pending his f/u visit   Discussed risk of cardioversion and anesthesia.  possibility of staying overnight if complications.   2.  Ascending aorta is mildly dilated on echo   3. HTN improved currently.  Current medicines are reviewed with the patient today.  The patient Has no concerns regarding medicines.  The following changes have been made:  See above Labs/ tests ordered today include:see above  Disposition:   FU:  see above  Signed, Cecilie Kicks, NP  10/01/2016 1:00 PM    [facility], [place], [place], [place] Phone: [phone]; Fax: [phone]

## 2016-10-01 ENCOUNTER — Telehealth: Payer: Self-pay | Admitting: *Deleted

## 2016-10-01 ENCOUNTER — Ambulatory Visit (INDEPENDENT_AMBULATORY_CARE_PROVIDER_SITE_OTHER): Payer: BLUE CROSS/BLUE SHIELD | Admitting: Cardiology

## 2016-10-01 ENCOUNTER — Encounter: Payer: Self-pay | Admitting: Cardiology

## 2016-10-01 VITALS — BP 144/90 | HR 112 | Ht 71.0 in | Wt 285.0 lb

## 2016-10-01 DIAGNOSIS — I4891 Unspecified atrial fibrillation: Secondary | ICD-10-CM

## 2016-10-01 DIAGNOSIS — I4819 Other persistent atrial fibrillation: Secondary | ICD-10-CM

## 2016-10-01 DIAGNOSIS — I481 Persistent atrial fibrillation: Secondary | ICD-10-CM

## 2016-10-01 DIAGNOSIS — Z01812 Encounter for preprocedural laboratory examination: Secondary | ICD-10-CM

## 2016-10-01 DIAGNOSIS — I10 Essential (primary) hypertension: Secondary | ICD-10-CM | POA: Diagnosis not present

## 2016-10-01 LAB — CBC WITH DIFFERENTIAL/PLATELET
Basophils Absolute: 0 cells/uL (ref 0–200)
Basophils Relative: 0 %
EOS PCT: 0 %
Eosinophils Absolute: 0 cells/uL — ABNORMAL LOW (ref 15–500)
HCT: 47.1 % (ref 38.5–50.0)
HEMOGLOBIN: 16.1 g/dL (ref 13.2–17.1)
LYMPHS ABS: 832 {cells}/uL — AB (ref 850–3900)
LYMPHS PCT: 4 %
MCH: 30.4 pg (ref 27.0–33.0)
MCHC: 34.2 g/dL (ref 32.0–36.0)
MCV: 89 fL (ref 80.0–100.0)
MONOS PCT: 2 %
MPV: 9.6 fL (ref 7.5–12.5)
Monocytes Absolute: 416 cells/uL (ref 200–950)
NEUTROS PCT: 94 %
Neutro Abs: 19552 cells/uL — ABNORMAL HIGH (ref 1500–7800)
PLATELETS: 398 10*3/uL (ref 140–400)
RBC: 5.29 MIL/uL (ref 4.20–5.80)
RDW: 13.3 % (ref 11.0–15.0)
WBC: 20.8 10*3/uL — AB (ref 3.8–10.8)

## 2016-10-01 LAB — BASIC METABOLIC PANEL
BUN: 27 mg/dL — ABNORMAL HIGH (ref 7–25)
CALCIUM: 9.7 mg/dL (ref 8.6–10.3)
CO2: 24 mmol/L (ref 20–31)
Chloride: 105 mmol/L (ref 98–110)
Creat: 1.39 mg/dL — ABNORMAL HIGH (ref 0.60–1.35)
GLUCOSE: 147 mg/dL — AB (ref 65–99)
Potassium: 4.5 mmol/L (ref 3.5–5.3)
SODIUM: 140 mmol/L (ref 135–146)

## 2016-10-01 LAB — MAGNESIUM: Magnesium: 2.1 mg/dL (ref 1.5–2.5)

## 2016-10-01 LAB — PROTIME-INR
INR: 1.3 — AB
PROTHROMBIN TIME: 13.3 s — AB (ref 9.0–11.5)

## 2016-10-01 NOTE — Telephone Encounter (Signed)
PT CONTACTED TO MAKE SURE OF [place] ENTRANCE FOR PROCEDURE

## 2016-10-01 NOTE — Patient Instructions (Addendum)
Medication Instructions:  Your physician recommends that you continue on your current medications as directed. Please refer to the Current Medication list given to you today.  Labwork: Your physician recommends that you have lab work CBC, BMET,  Mg,and  PT/INR  Testing/Procedures: NONE  Follow-Up: Keep your follow-up with Dr. Curt Bears   If you need a refill on your cardiac medications before your next appointment, please call your pharmacy.

## 2016-10-02 ENCOUNTER — Ambulatory Visit (HOSPITAL_COMMUNITY)
Admission: RE | Admit: 2016-10-02 | Discharge: 2016-10-02 | Disposition: A | Discharge status: Home / Self Care | Payer: BLUE CROSS/BLUE SHIELD | Source: Ambulatory Visit | Attending: Cardiology | Admitting: Cardiology

## 2016-10-02 ENCOUNTER — Ambulatory Visit (HOSPITAL_COMMUNITY): Payer: BLUE CROSS/BLUE SHIELD | Admitting: Anesthesiology

## 2016-10-02 ENCOUNTER — Encounter (HOSPITAL_COMMUNITY)
Admission: RE | Disposition: A | Discharge status: Home / Self Care | Payer: Self-pay | Source: Ambulatory Visit | Attending: Cardiology

## 2016-10-02 ENCOUNTER — Encounter (HOSPITAL_COMMUNITY): Payer: Self-pay | Admitting: *Deleted

## 2016-10-02 DIAGNOSIS — Z7952 Long term (current) use of systemic steroids: Secondary | ICD-10-CM | POA: Diagnosis not present

## 2016-10-02 DIAGNOSIS — Z96651 Presence of right artificial knee joint: Secondary | ICD-10-CM | POA: Diagnosis not present

## 2016-10-02 DIAGNOSIS — Z6839 Body mass index (BMI) 39.0-39.9, adult: Secondary | ICD-10-CM | POA: Diagnosis not present

## 2016-10-02 DIAGNOSIS — Z87891 Personal history of nicotine dependence: Secondary | ICD-10-CM | POA: Diagnosis not present

## 2016-10-02 DIAGNOSIS — Z79899 Other long term (current) drug therapy: Secondary | ICD-10-CM | POA: Diagnosis not present

## 2016-10-02 DIAGNOSIS — I10 Essential (primary) hypertension: Secondary | ICD-10-CM | POA: Diagnosis not present

## 2016-10-02 DIAGNOSIS — I4891 Unspecified atrial fibrillation: Secondary | ICD-10-CM | POA: Diagnosis not present

## 2016-10-02 DIAGNOSIS — K219 Gastro-esophageal reflux disease without esophagitis: Secondary | ICD-10-CM | POA: Insufficient documentation

## 2016-10-02 DIAGNOSIS — I481 Persistent atrial fibrillation: Secondary | ICD-10-CM | POA: Diagnosis not present

## 2016-10-02 DIAGNOSIS — Z7901 Long term (current) use of anticoagulants: Secondary | ICD-10-CM | POA: Diagnosis not present

## 2016-10-02 HISTORY — PX: CARDIOVERSION: SHX1299

## 2016-10-02 SURGERY — CARDIOVERSION
Anesthesia: General

## 2016-10-02 MED ORDER — PROPOFOL 10 MG/ML IV BOLUS
INTRAVENOUS | Status: DC | PRN
  Administered 2016-10-02: 100 mg via INTRAVENOUS
  Administered 2016-10-02: 20 mg via INTRAVENOUS

## 2016-10-02 MED ORDER — SODIUM CHLORIDE 0.9 % IV SOLN
INTRAVENOUS | Status: DC
  Administered 2016-10-02: 500 mL via INTRAVENOUS

## 2016-10-02 MED ORDER — LIDOCAINE 2% (20 MG/ML) 5 ML SYRINGE
INTRAMUSCULAR | Status: DC | PRN
  Administered 2016-10-02: 60 mg via INTRAVENOUS

## 2016-10-02 NOTE — Anesthesia Preprocedure Evaluation (Signed)
Anesthesia Evaluation  Patient identified by MRN, date of birth, ID band Patient awake    Reviewed: Allergy & Precautions, NPO status , Patient's Chart, lab work & pertinent test results  Airway Mallampati: II  TM Distance: >3 FB Neck ROM: Full    Dental no notable dental hx.    Pulmonary neg pulmonary ROS, former smoker,    Pulmonary exam normal breath sounds clear to auscultation       Cardiovascular hypertension,  Rhythm:Irregular Rate:Normal     Neuro/Psych negative neurological ROS  negative psych ROS   GI/Hepatic negative GI ROS, Neg liver ROS,   Endo/Other  Morbid obesity  Renal/GU negative Renal ROS  negative genitourinary   Musculoskeletal negative musculoskeletal ROS (+)   Abdominal   Peds negative pediatric ROS (+)  Hematology negative hematology ROS (+)   Anesthesia Other Findings   Reproductive/Obstetrics negative OB ROS                             Anesthesia Physical Anesthesia Plan  ASA: III  Anesthesia Plan: General   Post-op Pain Management:    Induction: Intravenous  Airway Management Planned: Mask  Additional Equipment:   Intra-op Plan:   Post-operative Plan:   Informed Consent: I have reviewed the patients History and Physical, chart, labs and discussed the procedure including the risks, benefits and alternatives for the proposed anesthesia with the patient or authorized representative who has indicated his/her understanding and acceptance.   Dental advisory given  Plan Discussed with: CRNA and Surgeon  Anesthesia Plan Comments:         Anesthesia Quick Evaluation

## 2016-10-02 NOTE — Addendum Note (Signed)
Addended by: Aris Georgia, Rico Massar L on: 10/02/2016 11:53 AM   Modules accepted: Orders

## 2016-10-02 NOTE — H&P (View-Only) (Signed)
Cardiology Office Note   Date:  10/01/2016   ID:  Gregory Barrett, DOB 04-Aug-1971, MRN [id number]  PCP:  [name], MD  Cardiologist:  Dr. Curt Bears    Chief Complaint  Patient presents with  . Atrial Fibrillation      History of Present Illness: Gregory Barrett is a 45 y.o. male who presents for EKG eval after beginning flecainide.    He has a history of HTN, fairly recent finding of AFib and returns today for f/u evaluation.  He is being seen for Dr. Curt Bears.  He was last seen by him in Oct with plans to get DCCV, TTE and return to discuss possible AAD.  He underwent DCCV 09/13/16, was noted to be hypokalemic and started on K+ and his PMD has discussed evaluation for hyperaldosteronism, though holding off at this time, and has been adjusting meds for better BP control.    Today BP is controlled but HR is slightly higher.  He has no chest pain and no SOB.  His EKG today reveals A fib with rate of 112.  His QRS mildly wider at 100 ms up from 90 ms.  Discussed with [employer].  Pt is scheduled for DCCV tomorrow.  He has been instructed on arrival time.     Past Medical History:  Diagnosis Date  . Arthritis    legs, back   . GERD (gastroesophageal reflux disease)   . Hypertension   . Migraine     Past Surgical History:  Procedure Laterality Date  . CARDIOVERSION N/A 09/13/2016   Procedure: CARDIOVERSION;  Surgeon: Fay Records, MD;  Location: [place] [facility];  Service: Cardiovascular;  Laterality: N/A;  . HARDWARE REMOVAL Right 12/12/2015   Procedure: HARDWARE REMOVAL RIGHT TIBIAL;  Surgeon: Altamese Morehouse, MD;  Location: [facility];  Service: Orthopedics;  Laterality: Right;  . KNEE SURGERY     right  . TIBIA FRACTURE SURGERY  YRS AGO   right  . TOTAL KNEE ARTHROPLASTY Right 03/19/2016   Procedure: RIGHT TOTAL KNEE ARTHROPLASTY;  Surgeon: Paralee Cancel, MD;  Location[facility];  Service: Orthopedics;  Laterality: Right;  Marland Kitchen VASECTOMY       Current Outpatient Prescriptions    Medication Sig Dispense Refill  . cyclobenzaprine (FLEXERIL) 10 MG tablet Take 1 tablet by mouth as directed.  0  . esomeprazole (NEXIUM) 20 MG capsule Take 20 mg by mouth daily before breakfast.     . flecainide (TAMBOCOR) 50 MG tablet Take 1 tablet (50 mg total) by mouth 2 (two) times daily. 60 tablet 4  . lisinopril (PRINIVIL,ZESTRIL) 40 MG tablet Take 1 tablet (40 mg total) by mouth daily. 90 tablet 2  . metoprolol succinate (TOPROL-XL) 100 MG 24 hr tablet Take 1 tablet (100 mg total) by mouth daily. Take with or immediately following a meal. 90 tablet 3  . predniSONE (DELTASONE) 50 MG tablet Take 1 tablet by mouth as directed.  0  . rivaroxaban (XARELTO) 20 MG TABS tablet Take 1 tablet (20 mg total) by mouth daily with supper. 90 tablet 3  . spironolactone (ALDACTONE) 25 MG tablet Take 1 tablet (25 mg total) by mouth daily. 90 tablet 3   No current facility-administered medications for this visit.     Allergies:   Patient has no known allergies.    Social History:  The patient  reports that he has quit smoking. He has quit using smokeless tobacco. His smokeless tobacco use included Chew. He reports that he drinks alcohol. He reports  that he does not use drugs.   Family History:  The patient's family history includes Cancer in his maternal grandfather; Hypertension in his maternal grandmother.    ROS:  General:no colds or fevers, no weight changes Skin:no rashes or ulcers HEENT:no blurred vision, no congestion CV:see HPI PUL:see HPI GI:no diarrhea constipation or melena, no indigestion GU:no hematuria, no dysuria MS:no joint pain, no claudication Neuro:no syncope, no lightheadedness Endo:no diabetes, no thyroid disease  Wt Readings from Last 3 Encounters:  10/01/16 285 lb (129.3 kg)  09/27/16 283 lb (128.4 kg)  09/17/16 287 lb 12.8 oz (130.5 kg)     PHYSICAL EXAM: VS:  BP (!) 144/90 (BP Location: Right Arm, Patient Position: Sitting, Cuff Size: Large)   Pulse (!) 112    Ht 5\' 11"  (1.803 m)   Wt 285 lb (129.3 kg)   BMI 39.75 kg/m  , BMI Body mass index is 39.75 kg/m. General:Pleasant affect, NAD Skin:Warm and dry, brisk capillary refill HEENT:normocephalic, sclera clear, mucus membranes moist Neck:supple, no JVD, no bruits  Heart:irregular irregular without murmur, gallup, rub or click Lungs:clear without rales, rhonchi, or wheezes [id number], non tender, + BS, do not palpate liver spleen or masses Ext:no lower ext edema,  Neuro:alert and oriented, MAE, follows commands, + facial symmetry    EKG:  EKG is ordered today. The ekg ordered today demonstrates a fib with RVR and QRS at 100 ms up from 90 ms   Recent Labs: 12/11/2015: ALT 34 08/06/2016: TSH 2.15 09/06/2016: Platelets 319 09/13/2016: Hemoglobin 13.6 09/23/2016: BUN 20; Creatinine, Ser 1.15; Potassium 4.0; Sodium 140    Lipid Panel    Component Value Date/Time   CHOL 168 07/01/2014 1019   TRIG 77.0 07/01/2014 1019   HDL 38.00 (L) 07/01/2014 1019   CHOLHDL 4 07/01/2014 1019   VLDL 15.4 07/01/2014 1019   LDLCALC 115 (H) 07/01/2014 1019       Other studies Reviewed: Additional studies/ records that were reviewed today include:  ECHO 08/20/16  Study Conclusions  - Left ventricle: The cavity size was normal. Wall thickness was   increased in a pattern of mild LVH. Systolic function was normal.   The estimated ejection fraction was in the range of 50% to 55%.   Wall motion was normal; there were no regional wall motion   abnormalities. There was no evidence of elevated ventricular   filling pressure by Doppler parameters. - Aorta: Ascending aortic diameter: 43 mm (S). - Ascending aorta: The ascending aorta was mildly dilated. - Left atrium: Volume/bsa, ES (1-plane Simpson&'s, A4C): 30.2   ml/m^2. - Right ventricle: The cavity size was mildly dilated. Wall   thickness was normal.   ASSESSMENT AND PLAN:   1. Persistent AF     CHA2DS2Vasc is at least 1, on xarelto has not  missed any doses and is aware not to miss any.  If missed risk of stroke is greater..      to see Dr. Curt Bears nest week.     No WMA on his echo, now  Flecainide 50mg  BID today, get an EKG stable with A fib with RVR    plan for DCCV ASAP since he doesn't feel great in AF  10/02/16          will need plans for stress test pending his f/u visit   Discussed risk of cardioversion and anesthesia.  possibility of staying overnight if complications.   2.  Ascending aorta is mildly dilated on echo   3. HTN improved currently.  Current medicines are reviewed with the patient today.  The patient Has no concerns regarding medicines.  The following changes have been made:  See above Labs/ tests ordered today include:see above  Disposition:   FU:  see above  Signed, Cecilie Kicks, NP  10/01/2016 1:00 PM    [facility], [place], [place], [place] Phone: [phone]; Fax: [phone]

## 2016-10-02 NOTE — Procedures (Signed)
Electrical Cardioversion Procedure Note Gregory Barrett [id number] [birth year]  Procedure: Electrical Cardioversion Indications:  Atrial Fibrillation.  He has not missed any Xarelto doses.   Procedure Details Consent: Risks of procedure as well as the alternatives and risks of each were explained to the (patient/caregiver).  Consent for procedure obtained. Time Out: Verified patient identification, verified procedure, site/side was marked, verified correct patient position, special equipment/implants available, medications/allergies/relevent history reviewed, required imaging and test results available.  Performed  Patient placed on cardiac monitor, pulse oximetry, supplemental oxygen as necessary.  Sedation given: Propofol per anesthesiology Pacer pads placed anterior and posterior chest.  Cardioverted 2 time(s). Sternal pressure used for the second, successful, cardioversion.  Cardioverted at [place].  Evaluation Findings: Post procedure EKG shows: NSR Complications: None Patient did tolerate procedure well.   Gregory Barrett 10/02/2016, 2:15 PM

## 2016-10-02 NOTE — Interval H&P Note (Signed)
History and Physical Interval Note:  10/02/2016 2:06 PM  Gregory Barrett  has presented today for surgery, with the diagnosis of AFIB  The various methods of treatment have been discussed with the patient and family. After consideration of risks, benefits and other options for treatment, the patient has consented to  Procedure(s): CARDIOVERSION (N/A) as a surgical intervention .  The patient's history has been reviewed, patient examined, no change in status, stable for surgery.  I have reviewed the patient's chart and labs.  Questions were answered to the patient's satisfaction.     Israa Caban [name]

## 2016-10-02 NOTE — Discharge Instructions (Signed)
Electrical Cardioversion, Care After °This sheet gives you information about how to care for yourself after your procedure. Your health care provider may also give you more specific instructions. If you have problems or questions, contact your health care provider. °What can I expect after the procedure? °After the procedure, it is common to have: °· Some redness on the skin where the shocks were given. °Follow these instructions at home: °· Do not drive for 24 hours if you were given a medicine to help you relax (sedative). °· Take over-the-counter and prescription medicines only as told by your health care provider. °· Ask your health care provider how to check your pulse. Check it often. °· Rest for 48 hours after the procedure or as told by your health care provider. °· Avoid or limit your caffeine use as told by your health care provider. °Contact a health care provider if: °· You feel like your heart is beating too quickly or your pulse is not regular. °· You have a serious muscle cramp that does not go away. °Get help right away if: °· You have discomfort in your chest. °· You are dizzy or you feel faint. °· You have trouble breathing or you are short of breath. °· Your speech is slurred. °· You have trouble moving an arm or leg on one side of your body. °· Your fingers or toes turn cold or blue. °This information is not intended to replace advice given to you by your health care provider. Make sure you discuss any questions you have with your health care provider. °Document Released: 08/04/2013 Document Revised: 05/17/2016 Document Reviewed: 04/19/2016 °Elsevier Interactive Patient Education © 2017 [employer] Inc[employer] ° °

## 2016-10-02 NOTE — Transfer of Care (Signed)
Immediate Anesthesia Transfer of Care Note  Patient: Gregory Barrett  Procedure(s) Performed: Procedure(s): CARDIOVERSION (N/A)  Patient Location: Endoscopy Unit  Anesthesia Type:General  Level of Consciousness: awake, alert  and oriented  Airway & Oxygen Therapy: Patient Spontanous Breathing  Post-op Assessment: Report given to RN, Post -op Vital signs reviewed and stable and Patient moving all extremities X 4  Post vital signs: Reviewed and stable  Last Vitals:  Vitals:   10/02/16 1255  BP: (!) 153/80  Pulse: (!) 117  Resp: 17  Temp: 36.8 C    Last Pain:  Vitals:   10/02/16 1255  TempSrc: Oral         Complications: No apparent anesthesia complications

## 2016-10-03 ENCOUNTER — Encounter (HOSPITAL_COMMUNITY): Payer: Self-pay | Admitting: Cardiology

## 2016-10-03 NOTE — Anesthesia Postprocedure Evaluation (Signed)
Anesthesia Post Note  Patient: Gregory Barrett  Procedure(s) Performed: Procedure(s) (LRB): CARDIOVERSION (N/A)  Patient location during evaluation: PACU Anesthesia Type: General Level of consciousness: awake and alert Pain management: pain level controlled Vital Signs Assessment: post-procedure vital signs reviewed and stable Respiratory status: spontaneous breathing, nonlabored ventilation, respiratory function stable and patient connected to nasal cannula oxygen Cardiovascular status: blood pressure returned to baseline and stable Postop Assessment: no signs of nausea or vomiting Anesthetic complications: no    Last Vitals:  Vitals:   10/02/16 1430 10/02/16 1440  BP: (!) 144/92 139/88  Pulse: 74 74  Resp: (!) 21 15  Temp:      Last Pain:  Vitals:   10/02/16 1420  TempSrc: Oral                 [name]

## 2016-10-11 ENCOUNTER — Ambulatory Visit (INDEPENDENT_AMBULATORY_CARE_PROVIDER_SITE_OTHER): Payer: BLUE CROSS/BLUE SHIELD | Admitting: Cardiology

## 2016-10-11 ENCOUNTER — Encounter: Payer: Self-pay | Admitting: Cardiology

## 2016-10-11 VITALS — BP 158/100 | HR 106 | Ht 71.0 in | Wt 281.4 lb

## 2016-10-11 DIAGNOSIS — I481 Persistent atrial fibrillation: Secondary | ICD-10-CM

## 2016-10-11 DIAGNOSIS — I4819 Other persistent atrial fibrillation: Secondary | ICD-10-CM

## 2016-10-11 MED ORDER — CARVEDILOL 25 MG PO TABS: 25.0000 mg | ORAL_TABLET | Freq: Two times a day (BID) | ORAL | 3 refills | Status: DC

## 2016-10-11 MED ORDER — FLECAINIDE ACETATE 100 MG PO TABS: 100.0000 mg | ORAL_TABLET | Freq: Two times a day (BID) | ORAL | 3 refills | Status: DC

## 2016-10-11 NOTE — Progress Notes (Signed)
Electrophysiology Office Note   Date:  10/11/2016   ID:  Gregory Barrett, DOB April 27, 1971, MRN [id number]  PCP:  Gregory Cowden, MD Primary Electrophysiologist:  Gregory Boehler Gregory Leeds, MD    Chief Complaint  Patient presents with  . Follow-up    Persistent AFib  . Chest Pain     History of Present Illness: Gregory Barrett is a 45 y.o. male who presents today for electrophysiology evaluation.   History of hypertension who presented to his PCP with syncope and newly diagnosed atrial fibrillation. Cardioversion performed 10/02/16. Started on flecainide.   Today, he denies symptoms of chest pain,  orthopnea, PND, lower extremity edema, claudication, dizziness, presyncope, syncope, bleeding, or neurologic sequela. The patient is tolerating medications without difficulties and is otherwise without complaint today.    Past Medical History:  Diagnosis Date  . Arthritis    legs, back   . GERD (gastroesophageal reflux disease)   . Hypertension   . Migraine   . Persistent atrial fibrillation [facility])    Past Surgical History:  Procedure Laterality Date  . CARDIOVERSION N/A 09/13/2016   Procedure: CARDIOVERSION;  Surgeon: Gregory Records, MD;  Location: [facility] ENDOSCOPY;  Service: Cardiovascular;  Laterality: N/A;  . CARDIOVERSION N/A 10/02/2016   Procedure: CARDIOVERSION;  Surgeon: Gregory Dresser, MD;  Location: [place];  Service: Cardiovascular;  Laterality: N/A;  . HARDWARE REMOVAL Right 12/12/2015   Procedure: HARDWARE REMOVAL RIGHT TIBIAL;  Surgeon: Gregory [place], MD;  Location: [place];  Service: Orthopedics;  Laterality: Right;  . KNEE SURGERY     right  . TIBIA FRACTURE SURGERY  YRS AGO   right  . TOTAL KNEE ARTHROPLASTY Right 03/19/2016   Procedure: RIGHT TOTAL KNEE ARTHROPLASTY;  Surgeon: Gregory Cancel, MD;  Location: [facility];  Service: Orthopedics;  Laterality: Right;  Marland Kitchen VASECTOMY       Current Outpatient Prescriptions  Medication Sig Dispense Refill  . cyclobenzaprine  (FLEXERIL) 10 MG tablet Take 1 tablet by mouth as directed.  0  . esomeprazole (NEXIUM) 20 MG capsule Take 20 mg by mouth daily before breakfast.     . flecainide (TAMBOCOR) 50 MG tablet Take 1 tablet (50 mg total) by mouth 2 (two) times daily. 60 tablet 4  . lisinopril (PRINIVIL,ZESTRIL) 40 MG tablet Take 1 tablet (40 mg total) by mouth daily. 90 tablet 2  . metoprolol succinate (TOPROL-XL) 100 MG 24 hr tablet Take 1 tablet (100 mg total) by mouth daily. Take with or immediately following a meal. 90 tablet 3  . rivaroxaban (XARELTO) 20 MG TABS tablet Take 1 tablet (20 mg total) by mouth daily with supper. 90 tablet 3  . spironolactone (ALDACTONE) 25 MG tablet Take 1 tablet (25 mg total) by mouth daily. 90 tablet 3   No current facility-administered medications for this visit.     Allergies:   Patient has no known allergies.   Social History:  The patient  reports that he has quit smoking. He has quit using smokeless tobacco. His smokeless tobacco use included Chew. He reports that he drinks alcohol. He reports that he does not use drugs.   Family History:  The patient's family history includes Cancer in his maternal grandfather; Hypertension in his maternal grandmother.    ROS:  Please see the history of present illness.   Otherwise, review of systems is positive for Fatigue, chest pain, shortness of breath, visual changes, anxiety, headaches.   All other systems are reviewed and negative.    PHYSICAL EXAM:  VS:  BP (!) 158/100   Pulse (!) 106   Ht 5\' 11"  (1.803 m)   Wt 281 lb 6.4 oz (127.6 kg)   BMI 39.25 kg/m  , BMI Body mass index is 39.25 kg/m. GEN: Well nourished, well developed, in no acute distress  HEENT: normal  Neck: no JVD, carotid bruits, or masses Cardiac: iRRR; no murmurs, rubs, or gallops,no edema  Respiratory:  clear to auscultation bilaterally, normal work of breathing GI: soft, nontender, nondistended, + BS MS: no deformity or atrophy  Skin: warm and dry Neuro:   Strength and sensation are intact Psych: euthymic mood, full affect  EKG:  EKG is ordered today. Personal review of the ekg ordered shows atrial fibrillation, rate 106  Recent Labs: 12/11/2015: ALT 34 08/06/2016: TSH 2.15 10/01/2016: BUN 27; Creat 1.39; Hemoglobin 16.1; Magnesium 2.1; Platelets 398; Potassium 4.5; Sodium 140    Lipid Panel     Component Value Date/Time   CHOL 168 07/01/2014 1019   TRIG 77.0 07/01/2014 1019   HDL 38.00 (L) 07/01/2014 1019   CHOLHDL 4 07/01/2014 1019   VLDL 15.4 07/01/2014 1019   LDLCALC 115 (H) 07/01/2014 1019     Wt Readings from Last 3 Encounters:  10/11/16 281 lb 6.4 oz (127.6 kg)  10/02/16 285 lb (129.3 kg)  10/01/16 285 lb (129.3 kg)      Other studies Reviewed: Additional studies/ Barrett that were reviewed today include: PCP notes  ASSESSMENT AND PLAN:  1. Persistent atrial fibrillation: on Xarelto. Cardioversion 10/02/16. Unfortunately he feels that he went back into atrial fibrillation on December 10. This is confirmed by his EKG. I discussed with him the option of ablation versus continued medical management with flecainide. At this time he wishes to increase his flecainide.  This patients CHA2DS2-VASc Score and unadjusted Ischemic Stroke Rate (% per year) is equal to 0.6 % stroke rate/year from a score of 1  Above score calculated as 1 Barrett each if present [CHF, HTN, DM, Vascular=MI/PAD/Aortic Plaque, Age if 65-74, or Male] Above score calculated as 2 points each if present [Age > 75, or Stroke/TIA/TE]  2. Hypertension: Elevated today. We [name] therefore switch his metoprolol to 25 mg of carvedilol  3. Syncope: No further episodes of syncope, likely due to his viral illness.    Current medicines are reviewed at length with the patient today.   The patient does not have concerns regarding his medicines.  The following changes were made today:  Xarelto  Labs/ tests ordered today include: TTE Orders Placed This Encounter    Procedures  . EKG 12-Lead     Disposition:   FU with Gregory Barrett 6 weeks  Signed, Gregory Achorn Gregory Leeds, MD  10/11/2016 4:21 PM     [address] [place] [phone] (office) [phone] (fax)

## 2016-10-11 NOTE — Patient Instructions (Signed)
Medication Instructions: 1) Increase Flecainide to 100 mg twice daily 2) Stop Metoprolol  3) Start Coreg 25 mg twice daily  Labwork: None ordered  Procedures/Testing: None ordered  Follow-Up: Your physician recommends that you schedule a follow-up appointment in: 1-2 weeks for a nurse visit EKG.  Your physician recommends that you schedule a follow-up appointment in late January/beginning of February 2018 with Dr. Curt Bears.    Any Additional Special Instructions Will Be Listed Below (If Applicable).     If you need a refill on your cardiac medications before your next appointment, please call your pharmacy.

## 2016-10-15 ENCOUNTER — Encounter: Payer: Self-pay | Admitting: Internal Medicine

## 2016-10-15 ENCOUNTER — Ambulatory Visit (INDEPENDENT_AMBULATORY_CARE_PROVIDER_SITE_OTHER): Payer: BLUE CROSS/BLUE SHIELD | Admitting: Internal Medicine

## 2016-10-15 VITALS — BP 124/72 | HR 74 | Temp 98.4°F | Ht 71.0 in | Wt 278.0 lb

## 2016-10-15 DIAGNOSIS — I10 Essential (primary) hypertension: Secondary | ICD-10-CM

## 2016-10-15 DIAGNOSIS — I480 Paroxysmal atrial fibrillation: Secondary | ICD-10-CM

## 2016-10-15 MED ORDER — ALPRAZOLAM 0.25 MG PO TABS: 0.2500 mg | ORAL_TABLET | Freq: Two times a day (BID) | ORAL | 0 refills | Status: DC | PRN

## 2016-10-15 NOTE — Progress Notes (Signed)
Subjective:    Patient ID: Gregory Barrett, male    DOB: [birth year], 45 y.o.   MRN: [id number]  HPI 45 year old patient who is seen today for follow-up of hypertension. Due to a history of unprovoked hypokalemia, hypertension may be secondary to primary hyperaldosteronism.  Due to issues with atrial fibrillation, uncontrolled hypertension, and lack of interest in consideration of surgery, need for early a.m. Blood draw ect have deferred testing for possible primary aldosteronism. He has been quite frustrated due to recurrent atrial fibrillation.  He has had 2 cardioversions and has been on anticoagulation as well as rhythm control medication. He states whenhe reverts to atrial fibrillation.  Blood pressures often run low, but are generally elevated and he is in a normal sinus rhythm Blood pressures tend to be higher in the morning and improves throughout the day. He states that he becomes tired and easily winded when in atrial fibrillation and often has difficulty sleeping due to anxiety issues.  He has been using his wife's alprazolam with some benefit and is requesting a prescription of his own.  Hypokalemia has resolved on spironolactone.  He no longer takes potassium supplements  Past Medical History:  Diagnosis Date  . Arthritis    legs, back   . GERD (gastroesophageal reflux disease)   . Hypertension   . Migraine   . Persistent atrial fibrillation [facility])      Social History   Social History  . Marital status: Married    Spouse name: N/A  . Number of children: N/A  . Years of education: N/A   Occupational History  . Not on file.   Social History Main Topics  . Smoking status: Former Research scientist (life sciences)  . Smokeless tobacco: Former Systems developer    Types: Chew  . Alcohol use Yes     Comment: occasional glass of wine   . Drug use: No  . Sexual activity: Not on file   Other Topics Concern  . Not on file   Social History Narrative  . No narrative on file    Past Surgical History:    Procedure Laterality Date  . CARDIOVERSION N/A 09/13/2016   Procedure: CARDIOVERSION;  Surgeon: Fay Records, MD;  Location: [facility] ENDOSCOPY;  Service: Cardiovascular;  Laterality: N/A;  . CARDIOVERSION N/A 10/02/2016   Procedure: CARDIOVERSION;  Surgeon: Larey Dresser, MD;  Location: [place];  Service: Cardiovascular;  Laterality: N/A;  . HARDWARE REMOVAL Right 12/12/2015   Procedure: HARDWARE REMOVAL RIGHT TIBIAL;  Surgeon: Altamese Harlowton, MD;  Location: [place];  Service: Orthopedics;  Laterality: Right;  . KNEE SURGERY     right  . TIBIA FRACTURE SURGERY  YRS AGO   right  . TOTAL KNEE ARTHROPLASTY Right 03/19/2016   Procedure: RIGHT TOTAL KNEE ARTHROPLASTY;  Surgeon: Paralee Cancel, MD;  Location: [facility];  Service: Orthopedics;  Laterality: Right;  Marland Kitchen VASECTOMY      Family History  Problem Relation Age of Onset  . Hypertension Maternal Grandmother   . Cancer Maternal Grandfather     lung ca    No Known Allergies  Current Outpatient Prescriptions on File Prior to Visit  Medication Sig Dispense Refill  . carvedilol (COREG) 25 MG tablet Take 1 tablet (25 mg total) by mouth 2 (two) times daily. 60 tablet 3  . esomeprazole (NEXIUM) 20 MG capsule Take 20 mg by mouth daily before breakfast.     . flecainide (TAMBOCOR) 100 MG tablet Take 1 tablet (100 mg total) by mouth 2 (two) times  daily. 60 tablet 3  . lisinopril (PRINIVIL,ZESTRIL) 40 MG tablet Take 1 tablet (40 mg total) by mouth daily. 90 tablet 2  . rivaroxaban (XARELTO) 20 MG TABS tablet Take 1 tablet (20 mg total) by mouth daily with supper. 90 tablet 3   No current facility-administered medications on file prior to visit.     BP 124/72 (BP Location: Right Arm, Patient Position: Sitting, Cuff Size: Normal)   Pulse 74   Temp 98.4 F (36.9 C) (Oral)   Ht 5\' 11"  (1.803 m)   Wt 278 lb (126.1 kg)   SpO2 98%   BMI 38.77 kg/m      Review of Systems  Constitutional: Positive for activity change and fatigue.  Respiratory:  Positive for chest tightness and shortness of breath.   Neurological: Positive for weakness.  Psychiatric/Behavioral: Positive for sleep disturbance. The patient is nervous/anxious.        Objective:   Physical Exam  Constitutional: He appears well-developed and well-nourished. No distress.  Blood pressure on arrival 124 over 72 Repeat blood pressure 140/80  No orthostatic change in his blood pressure, but the patient did become slightly dizzy  Neck: Neck supple. No thyromegaly present.  Cardiovascular:  Irregular rhythm with controlled ventricular response  Pulmonary/Chest: Effort normal and breath sounds normal.          Assessment & Plan:   Hypertension.  Remains quite labile.  No change of the present time Paroxysmal atrial fibrillation.  Patient is quite frustrated with inability to stay in a normal sinus rhythm.  He has had 2 cardioversions with only very temporary success. He wishes to follow with Dr. Aundra Dubin who assisted with his care as an inpatient.  Follow-up one month Cardiology follow-up  [name]

## 2016-10-15 NOTE — Progress Notes (Signed)
Pre visit review using our clinic review tool, if applicable. No additional management support is needed unless otherwise documented below in the visit note. 

## 2016-10-15 NOTE — Patient Instructions (Signed)
Limit your sodium (Salt) intake  Follow-up cardiology as discussed  Please check your blood pressure on a regular basis.  If it is consistently greater than 150/90, please call or contact by My Chart  Return in one month for follow-up

## 2016-10-18 ENCOUNTER — Ambulatory Visit (INDEPENDENT_AMBULATORY_CARE_PROVIDER_SITE_OTHER): Payer: BLUE CROSS/BLUE SHIELD

## 2016-10-18 VITALS — BP 138/90 | HR 66 | Resp 20 | Ht 71.0 in | Wt 283.0 lb

## 2016-10-18 DIAGNOSIS — I4891 Unspecified atrial fibrillation: Secondary | ICD-10-CM | POA: Diagnosis not present

## 2016-10-18 NOTE — Patient Instructions (Signed)
Your physician recommends that you continue on your current medications as directed. Please refer to the Current Medication list given to you today.  Your physician recommends that you keep your scheduled appointment with Dr. Curt Bears.

## 2016-10-18 NOTE — Progress Notes (Signed)
1.) Reason for visit: EKG  2.) Name of MD requesting visit: Dr. Curt Bears  3.) H&P: Patient is a 45 year old male with a history of hypertension and recent atrial fibrillation. Patient had cardioversion on 10/02/16. Patient had office visit on 10/11/16 with Dr. Curt Bears, who changed his flecainide to 100 mg by mouth BID, coreg 25 mg by mouth BID, and stopped metoprolol. Patient was still in A. Fib at that office visit with HR 106.  4.) ROS related to problem: Patient's vital signs BP 138/90, HR 66, Resp 20, and O2 96% on room air. EKG today showed normal sinus rhythm with HR 66. Patient complain of some SOB, denies chest pain. Patient stated that he was prescribed xanax for anxiety and for his SOB, but he has not taken any today.  5.) Assessment and plan per MD: EKG reviewed by Dr. Irish Lack. No changes at this time. Will forward nurse visit note to Dr. Curt Bears for any further instructions. Patient has a follow-up visit with Dr. Curt Bears on 11/29/16.

## 2016-10-24 DIAGNOSIS — J209 Acute bronchitis, unspecified: Secondary | ICD-10-CM | POA: Diagnosis not present

## 2016-10-24 DIAGNOSIS — J029 Acute pharyngitis, unspecified: Secondary | ICD-10-CM | POA: Diagnosis not present

## 2016-11-08 ENCOUNTER — Telehealth: Payer: Self-pay | Admitting: Cardiology

## 2016-11-08 NOTE — Telephone Encounter (Signed)
Informed patient I would discuss with Dr. Curt Bears and Dr. Rayann Heman next week about switching care to [facility]. Pt thanks me for helping.

## 2016-11-08 NOTE — Telephone Encounter (Signed)
New message  Pt call requesting to speak with RN. Pt states he would like to switch care from [facility] to a new EP Doctor. Please call back to discuss

## 2016-11-18 NOTE — Telephone Encounter (Signed)
I am happy to see. 

## 2016-11-18 NOTE — Telephone Encounter (Signed)
Reviewed with Dr. Curt Bears who verbalized ok for pt to change providers if he so wishes. Will forward to Dr. Rayann Heman and his nurse for approval to switch to his care.

## 2016-11-20 NOTE — Telephone Encounter (Signed)
Lenna Sciara is calling patient to schedule

## 2016-11-21 ENCOUNTER — Ambulatory Visit: Payer: BLUE CROSS/BLUE SHIELD | Admitting: Cardiology

## 2016-11-29 ENCOUNTER — Ambulatory Visit: Payer: BLUE CROSS/BLUE SHIELD | Admitting: Cardiology

## 2016-12-04 ENCOUNTER — Ambulatory Visit (INDEPENDENT_AMBULATORY_CARE_PROVIDER_SITE_OTHER): Payer: BLUE CROSS/BLUE SHIELD | Admitting: Internal Medicine

## 2016-12-04 ENCOUNTER — Encounter: Payer: Self-pay | Admitting: Internal Medicine

## 2016-12-04 VITALS — BP 128/74 | HR 78 | Ht 71.0 in | Wt 286.2 lb

## 2016-12-04 DIAGNOSIS — I481 Persistent atrial fibrillation: Secondary | ICD-10-CM

## 2016-12-04 DIAGNOSIS — I10 Essential (primary) hypertension: Secondary | ICD-10-CM

## 2016-12-04 DIAGNOSIS — I4819 Other persistent atrial fibrillation: Secondary | ICD-10-CM

## 2016-12-04 NOTE — Progress Notes (Signed)
PCP: [name], MD  Gregory Barrett is a 46 y.o. male who presents today for routine electrophysiology followup.  He was recently diagnosed with persistent afib.  He has been well managed by Dr Curt Bears. He is currently maintaining sinus rhythm with flecainide.  He feels that he is doing well at this time.   Since last being seen in our clinic, the patient reports doing very well.  Today, he denies symptoms of palpitations, chest pain, shortness of breath,  lower extremity edema, dizziness, presyncope, or syncope.  The patient is otherwise without complaint today.   Past Medical History:  Diagnosis Date  . Arthritis    legs, back   . GERD (gastroesophageal reflux disease)   . Hypertension   . Migraine   . Persistent atrial fibrillation [facility])    Past Surgical History:  Procedure Laterality Date  . CARDIOVERSION N/A 09/13/2016   Procedure: CARDIOVERSION;  Surgeon: Fay Records, MD;  Location: [facility] ENDOSCOPY;  Service: Cardiovascular;  Laterality: N/A;  . CARDIOVERSION N/A 10/02/2016   Procedure: CARDIOVERSION;  Surgeon: Larey Dresser, MD;  Location: [place];  Service: Cardiovascular;  Laterality: N/A;  . HARDWARE REMOVAL Right 12/12/2015   Procedure: HARDWARE REMOVAL RIGHT TIBIAL;  Surgeon: Altamese Lake Tansi, MD;  Location: [place];  Service: Orthopedics;  Laterality: Right;  . KNEE SURGERY     right  . TIBIA FRACTURE SURGERY  YRS AGO   right  . TOTAL KNEE ARTHROPLASTY Right 03/19/2016   Procedure: RIGHT TOTAL KNEE ARTHROPLASTY;  Surgeon: Paralee Cancel, MD;  Location: [facility];  Service: Orthopedics;  Laterality: Right;  Marland Kitchen VASECTOMY      ROS- all systems are reviewed and negatives except as per HPI above  Current Outpatient Prescriptions  Medication Sig Dispense Refill  . ALPRAZolam (XANAX) 0.25 MG tablet Take 1 tablet (0.25 mg total) by mouth 2 (two) times daily as needed for anxiety. 30 tablet 0  . carvedilol (COREG) 25 MG tablet Take 1 tablet (25 mg total) by mouth 2 (two)  times daily. 60 tablet 3  . esomeprazole (NEXIUM) 20 MG capsule Take 20 mg by mouth daily before breakfast.     . flecainide (TAMBOCOR) 100 MG tablet Take 1 tablet (100 mg total) by mouth 2 (two) times daily. 60 tablet 3  . lisinopril (PRINIVIL,ZESTRIL) 40 MG tablet Take 1 tablet (40 mg total) by mouth daily. 90 tablet 2  . rivaroxaban (XARELTO) 20 MG TABS tablet Take 1 tablet (20 mg total) by mouth daily with supper. 90 tablet 3   No current facility-administered medications for this visit.     Physical Exam: Vitals:   12/04/16 0849  BP: 128/74  Pulse: 78  Weight: 286 lb 3.2 oz (129.8 kg)  Height: 5\' 11"  (1.803 m)    GEN- The patient is well appearing, alert and oriented x 3 today.   Head- normocephalic, atraumatic Eyes-  Sclera clear, conjunctiva pink Ears- hearing intact Oropharynx- clear Lungs- Clear to ausculation bilaterally, normal work of breathing Heart- Regular rate and rhythm, no murmurs, rubs or gallops, PMI not laterally displaced GI- soft, NT, ND, + BS Extremities- no clubbing, cyanosis, or edema  ekg today reveals sinus rhythm 78 bpm, PR 200 msec, QRS 110 msec, QTc 446 msec  Assessment and Plan:  1. Persistent atrial fibrillation Doing well with flecainide chads2vasc score is 1.  Continue xarelto  2. HTN Stable No change required today  3. Obesity Body mass index is 39.92 kg/m. Weight loss advised  Today, I have  spent 25  minutes with the patient discussing afib management options .  More than 50% of the visit time today was spent on this issue.  Return to see me in 3 months

## 2016-12-04 NOTE — Patient Instructions (Signed)

## 2016-12-19 ENCOUNTER — Encounter: Payer: Self-pay | Admitting: Internal Medicine

## 2016-12-30 ENCOUNTER — Encounter: Payer: Self-pay | Admitting: Internal Medicine

## 2016-12-30 ENCOUNTER — Ambulatory Visit (INDEPENDENT_AMBULATORY_CARE_PROVIDER_SITE_OTHER): Payer: BLUE CROSS/BLUE SHIELD | Admitting: Internal Medicine

## 2016-12-30 VITALS — BP 120/78 | HR 75 | Temp 97.9°F | Ht 71.0 in | Wt 288.4 lb

## 2016-12-30 DIAGNOSIS — I10 Essential (primary) hypertension: Secondary | ICD-10-CM | POA: Diagnosis not present

## 2016-12-30 DIAGNOSIS — E269 Hyperaldosteronism, unspecified: Secondary | ICD-10-CM | POA: Diagnosis not present

## 2016-12-30 DIAGNOSIS — M7022 Olecranon bursitis, left elbow: Secondary | ICD-10-CM

## 2016-12-30 NOTE — Progress Notes (Signed)
Subjective:    Patient ID: Gregory Barrett, male    DOB: February 10, 1971, 46 y.o.   MRN: [id number]  HPI  46 year old patient who presents today with chief complaint of intermittent discomfort and swelling about the left elbow.  He has been using a brace with benefit.  At the present time.  The swelling seems to be improving and is largely resolved.  No pertinent trauma or repetitious activity history.  No prior history of bursitis  He does have a history of essential hypertension and concern for primary hyperaldosteronism due to unprovoked hypokalemia. This has not been pursued due to patient preference for medical therapy.  At the present time, he is off aldosterone.  He is agreeable for laboratory evaluation  Past Medical History:  Diagnosis Date  . Arthritis    legs, back   . GERD (gastroesophageal reflux disease)   . Hypertension   . Migraine   . Persistent atrial fibrillation [facility])      Social History   Social History  . Marital status: Married    Spouse name: N/A  . Number of children: N/A  . Years of education: N/A   Occupational History  . Not on file.   Social History Main Topics  . Smoking status: Former Research scientist (life sciences)  . Smokeless tobacco: Former Systems developer    Types: Chew  . Alcohol use Yes     Comment: occasional glass of wine   . Drug use: No  . Sexual activity: Not on file   Other Topics Concern  . Not on file   Social History Narrative  . No narrative on file    Past Surgical History:  Procedure Laterality Date  . CARDIOVERSION N/A 09/13/2016   Procedure: CARDIOVERSION;  Surgeon: Fay Records, MD;  Location: [facility] ENDOSCOPY;  Service: Cardiovascular;  Laterality: N/A;  . CARDIOVERSION N/A 10/02/2016   Procedure: CARDIOVERSION;  Surgeon: Larey Dresser, MD;  Location: [place];  Service: Cardiovascular;  Laterality: N/A;  . HARDWARE REMOVAL Right 12/12/2015   Procedure: HARDWARE REMOVAL RIGHT TIBIAL;  Surgeon: Altamese [place], MD;  Location: [place];  Service:  Orthopedics;  Laterality: Right;  . KNEE SURGERY     right  . TIBIA FRACTURE SURGERY  YRS AGO   right  . TOTAL KNEE ARTHROPLASTY Right 03/19/2016   Procedure: RIGHT TOTAL KNEE ARTHROPLASTY;  Surgeon: Paralee Cancel, MD;  Location: [facility];  Service: Orthopedics;  Laterality: Right;  Marland Kitchen VASECTOMY      Family History  Problem Relation Age of Onset  . Hypertension Maternal Grandmother   . Cancer Maternal Grandfather     lung ca    No Known Allergies  Current Outpatient Prescriptions on File Prior to Visit  Medication Sig Dispense Refill  . ALPRAZolam (XANAX) 0.25 MG tablet Take 1 tablet (0.25 mg total) by mouth 2 (two) times daily as needed for anxiety. 30 tablet 0  . carvedilol (COREG) 25 MG tablet Take 1 tablet (25 mg total) by mouth 2 (two) times daily. 60 tablet 3  . esomeprazole (NEXIUM) 20 MG capsule Take 20 mg by mouth daily before breakfast.     . flecainide (TAMBOCOR) 100 MG tablet Take 1 tablet (100 mg total) by mouth 2 (two) times daily. 60 tablet 3  . lisinopril (PRINIVIL,ZESTRIL) 40 MG tablet Take 1 tablet (40 mg total) by mouth daily. 90 tablet 2  . rivaroxaban (XARELTO) 20 MG TABS tablet Take 1 tablet (20 mg total) by mouth daily with supper. 90 tablet 3  No current facility-administered medications on file prior to visit.     BP 120/78 (BP Location: Left Arm, Patient Position: Sitting, Cuff Size: Normal)   Pulse 75   Temp 97.9 F (36.6 C) (Oral)   Ht 5\' 11"  (1.803 m)   Wt 288 lb 6.4 oz (130.8 kg)   SpO2 98%   BMI 40.22 kg/m     Review of Systems  Musculoskeletal:       Intermittent pain and swelling left elbow       Objective:   Physical Exam  Constitutional:  Blood pressure 120/78  Musculoskeletal:  Very  mild effusion involving the left olecranon bursa No significant erythema or tenderness          Assessment & Plan:   Mild olecranon bursitis.  This appears to be improving. We'll observe at this time.  Was given information concerning  rehabilitation if necessary Essential hypertension.  Well-controlled today.  There has been concern raised for primary aldosteronism.  We'll check early morning PAC/PRA at his convenience.  Patient presently off diuretic therapy.  He is on lisinopril, but the effects on laboratory studies should be negligible  [name]

## 2016-12-30 NOTE — Patient Instructions (Addendum)
Elbow Bursitis Elbow bursitis is inflammation of the fluid-filled sac (bursa) between the tip of your elbow bone (olecranon) and your skin. Elbow bursitis may also be called olecranon bursitis. Normally, the olecranon bursa has only a small amount of fluid in it to cushion and protect your elbow bone. Elbow bursitis causes fluid to build up inside the bursa. Over time, this swelling and inflammation can cause pain when you bend or lean on your elbow. What are the causes? Elbow bursitis may be caused by:  Elbow injury (acute trauma).  Leaning on hard surfaces for long periods of time.  Infection from an injury that breaks the skin near your elbow.  A bone growth (spur) that forms at the tip of your elbow.  A medical condition that causes inflammation in your body, such as gout or rheumatoid arthritis. The cause may also be unknown. What are the signs or symptoms? The first sign of elbow bursitis is usually swelling over the tip of your elbow. This can grow to be the size of a golf ball. This may start suddenly or develop gradually. You may also have:  Pain when bending or leaning on your elbow.  Restricted movement of your elbow. If your bursitis is caused by an infection, symptoms may also include:  Redness, warmth, and tenderness of the elbow.  Drainage of pus from the swollen area over your elbow, if the skin breaks open. How is this diagnosed? Your health care provider may be able to diagnose elbow bursitis based on your signs and symptoms, especially if you have recently been injured. Your health care provider will also do a physical exam. This may include:  X-rays to look for a bone spur or a bone fracture.  Draining fluid from the bursa to test it for infection.  Blood tests to rule out gout or rheumatoid arthritis. How is this treated? Treatment for elbow bursitis depends on the cause. Treatment may include:  Medicines. These may include:  Over-the-counter medicines to  relieve pain and inflammation.  Antibiotic medicines to fight infection.  Injections of anti-inflammatory medicines (steroids).  Wrapping your elbow with a bandage.  Draining fluid from the bursa.  Wearing elbow pads. If your bursitis does not get better with treatment, surgery may be needed to remove the bursa. Follow these instructions at home:  Take medicines only as directed by your health care provider.  If you were prescribed an antibiotic medicine, finish all of it even if you start to feel better.  If your bursitis is caused by an injury, rest your elbow and wear your bandage as directed by your health care provider. You may alsoapply ice to the injured area as directed by your health care provider:  Put ice in a plastic bag.  Place a towel between your skin and the bag.  Leave the ice on for 20 minutes, 2-3 times per day.  Avoid any activities that cause elbow pain.  Use elbow pads or elbow wraps to cushion your elbow. Contact a health care provider if:  You have a fever.  Your symptoms do not get better with treatment.  Your pain or swelling gets worse.  Your elbow pain or swelling goes away and then returns.  You have drainage of pus from the swollen area over your elbow. This information is not intended to replace advice given to you by your health care provider. Make sure you discuss any questions you have with your health care provider. Document Released: 11/13/2006 Document Revised: 03/21/2016 Document  Reviewed: 06/22/2014 Elsevier Interactive Patient Education  2017 [place]  Elbow Bursitis Rehab Ask your health care provider which exercises are safe for you. Do exercises exactly as told by your health care provider and adjust them as directed. It is normal to feel mild stretching, pulling, tightness, or discomfort as you do these exercises, but you should stop right away if you feel sudden pain or your pain gets worse. Do not begin these exercises  until told by your health care provider. Stretching and range of motion exercises These exercises warm up your muscles and joints and improve the movement and flexibility of your elbow. These exercises also help to relieve pain and swelling. Exercise A: Passive elbow flexion   1. Lie on your back. 2. Extend your left / right arm up into the air, bracing it with your other hand. Allow your left / right arm to relax. 3. Let your left / right elbow bend, allowing your hand to fall slowly toward your chest. You should feel a gentle stretch along the back of your upper arm and elbow. 4. If told by your health care provider, hold a __________ hand weight to increase the intensity of this stretch. 5. Hold this position for __________ seconds. 6. Slowly return your left / right arm to the upright position. Repeat __________ times. Complete this exercise __________ times a day. Exercise B: Passive elbow extension   1. Lie on your back on a firm bed. Make sure that you are in a comfortable position that allows you relax your arm muscles. 2. Place a folded towel under your left / right upper arm so that your elbow and shoulder are at the same height. 3. Extend your left / right arm so your elbow and hand do not rest on the bed or towel. 4. Let the weight of your hand straighten your elbow. Keep your arm and chest muscles relaxed. You should feel a stretch on the inside of your elbow. 5. If told by your health care provider, increase the intensity of your stretch by adding a small wrist weight or hand weight. 6. Hold this position for __________ seconds. 7. Slowly return to the starting position. Repeat __________ times. Complete this exercise __________ times a day. Strengthening exercises These exercises build strength and endurance in your elbow. Endurance is the ability to use your muscles for a long time, even after they get tired. Exercise C: Elbow extensors, isometric   1. Stand or sit upright  on a firm surface. 2. Place your left / right arm so your palm faces your abdomen, at the height of your waist. 3. Place your other hand on the underside of your forearm. Slowly push your left / right arm down, like you were going to straighten your elbow. Resist with your other hand. Push as hard as you can without causing any pain or movement at your left / right elbow. 4. Hold this position for __________ seconds. 5. Slowly release the tension in both arms. 6. Let your muscles relax completely before repeating. Repeat __________ times. Complete this exercise __________ times a day. Exercise D: Elbow flexors, isometric  1. Stand or sit upright on a firm surface. 2. Place your left / right arm so your hand is palm-up, at the height of your waist. 3. Place your other hand on top of your forearm. Gently push up with your left / right arm, like you were going to bend your elbow. Resist this motion with your other hand. Push  as hard as you can without causing any pain or movement at your left / right elbow. 4. Hold this position for __________ seconds. 5. Slowly release the tension in both arms. 6. Let your muscles relax completely before repeating. Repeat __________ times. Complete this exercise __________ times a day. This information is not intended to replace advice given to you by your health care provider. Make sure you discuss any questions you have with your health care provider. Document Released: 10/14/2005 Document Revised: 06/18/2016 Document Reviewed: 07/13/2015 Elsevier Interactive Patient Education  2017 [employer].   Return at your convenience for an 8 AM laboratory draw to exclude primary aldosteronism

## 2017-01-01 ENCOUNTER — Encounter: Payer: Self-pay | Admitting: Internal Medicine

## 2017-01-17 ENCOUNTER — Other Ambulatory Visit (INDEPENDENT_AMBULATORY_CARE_PROVIDER_SITE_OTHER): Payer: BLUE CROSS/BLUE SHIELD

## 2017-01-17 DIAGNOSIS — I481 Persistent atrial fibrillation: Secondary | ICD-10-CM

## 2017-01-17 DIAGNOSIS — I4819 Other persistent atrial fibrillation: Secondary | ICD-10-CM

## 2017-01-17 LAB — BASIC METABOLIC PANEL
BUN: 19 mg/dL (ref 6–23)
CHLORIDE: 105 meq/L (ref 96–112)
CO2: 30 mEq/L (ref 19–32)
Calcium: 8.9 mg/dL (ref 8.4–10.5)
Creatinine, Ser: 1.1 mg/dL (ref 0.40–1.50)
GFR: 77.54 mL/min (ref >60.00)
Glucose, Bld: 99 mg/dL (ref 70–99)
POTASSIUM: 3.9 meq/L (ref 3.5–5.1)
SODIUM: 142 meq/L (ref 135–145)

## 2017-01-22 ENCOUNTER — Other Ambulatory Visit: Payer: Self-pay | Admitting: Internal Medicine

## 2017-01-22 ENCOUNTER — Encounter: Payer: Self-pay | Admitting: Internal Medicine

## 2017-01-23 DIAGNOSIS — R509 Fever, unspecified: Secondary | ICD-10-CM | POA: Diagnosis not present

## 2017-01-23 DIAGNOSIS — J40 Bronchitis, not specified as acute or chronic: Secondary | ICD-10-CM | POA: Diagnosis not present

## 2017-01-23 DIAGNOSIS — J029 Acute pharyngitis, unspecified: Secondary | ICD-10-CM | POA: Diagnosis not present

## 2017-01-28 ENCOUNTER — Other Ambulatory Visit: Payer: Self-pay | Admitting: Cardiology

## 2017-01-31 ENCOUNTER — Other Ambulatory Visit: Payer: Self-pay | Admitting: Internal Medicine

## 2017-02-02 ENCOUNTER — Other Ambulatory Visit: Payer: Self-pay | Admitting: Internal Medicine

## 2017-02-07 ENCOUNTER — Other Ambulatory Visit: Payer: BLUE CROSS/BLUE SHIELD

## 2017-02-07 DIAGNOSIS — E269 Hyperaldosteronism, unspecified: Secondary | ICD-10-CM | POA: Diagnosis not present

## 2017-02-11 LAB — ALDOSTERONE + RENIN ACTIVITY W/ RATIO
ALDO / PRA Ratio: 135.7 Ratio — ABNORMAL HIGH (ref 0.9–28.9)
ALDOSTERONE: 19 ng/dL
PRA LC/MS/MS: 0.14 ng/mL/h — AB (ref 0.25–5.82)

## 2017-02-13 ENCOUNTER — Encounter: Payer: Self-pay | Admitting: Internal Medicine

## 2017-02-14 ENCOUNTER — Encounter (HOSPITAL_COMMUNITY): Payer: Self-pay | Admitting: Nurse Practitioner

## 2017-02-14 ENCOUNTER — Ambulatory Visit (HOSPITAL_COMMUNITY)
Admission: RE | Admit: 2017-02-14 | Discharge: 2017-02-14 | Disposition: A | Discharge status: Home / Self Care | Payer: BLUE CROSS/BLUE SHIELD | Source: Ambulatory Visit | Attending: Nurse Practitioner | Admitting: Nurse Practitioner

## 2017-02-14 ENCOUNTER — Encounter: Payer: Self-pay | Admitting: Internal Medicine

## 2017-02-14 VITALS — BP 122/88 | HR 111 | Ht 71.0 in | Wt 289.0 lb

## 2017-02-14 DIAGNOSIS — Z87891 Personal history of nicotine dependence: Secondary | ICD-10-CM | POA: Insufficient documentation

## 2017-02-14 DIAGNOSIS — I480 Paroxysmal atrial fibrillation: Secondary | ICD-10-CM | POA: Insufficient documentation

## 2017-02-14 DIAGNOSIS — I481 Persistent atrial fibrillation: Secondary | ICD-10-CM | POA: Diagnosis not present

## 2017-02-14 DIAGNOSIS — K219 Gastro-esophageal reflux disease without esophagitis: Secondary | ICD-10-CM | POA: Diagnosis not present

## 2017-02-14 DIAGNOSIS — Z7901 Long term (current) use of anticoagulants: Secondary | ICD-10-CM | POA: Diagnosis not present

## 2017-02-14 DIAGNOSIS — I4819 Other persistent atrial fibrillation: Secondary | ICD-10-CM

## 2017-02-14 DIAGNOSIS — Z96651 Presence of right artificial knee joint: Secondary | ICD-10-CM | POA: Insufficient documentation

## 2017-02-14 DIAGNOSIS — I10 Essential (primary) hypertension: Secondary | ICD-10-CM | POA: Diagnosis not present

## 2017-02-14 DIAGNOSIS — G43909 Migraine, unspecified, not intractable, without status migrainosus: Secondary | ICD-10-CM | POA: Insufficient documentation

## 2017-02-14 DIAGNOSIS — M1990 Unspecified osteoarthritis, unspecified site: Secondary | ICD-10-CM | POA: Insufficient documentation

## 2017-02-14 LAB — MAGNESIUM: Magnesium: 1.9 mg/dL (ref 1.7–2.4)

## 2017-02-14 LAB — BASIC METABOLIC PANEL
ANION GAP: 10 (ref 5–15)
BUN: 18 mg/dL (ref 6–20)
CALCIUM: 8.8 mg/dL — AB (ref 8.9–10.3)
CO2: 25 mmol/L (ref 22–32)
CREATININE: 1.24 mg/dL (ref 0.61–1.24)
Chloride: 106 mmol/L (ref 101–111)
GFR calc Af Amer: >60 mL/min (ref >60)
GFR calc non Af Amer: >60 mL/min (ref >60)
GLUCOSE: 101 mg/dL — AB (ref 65–99)
Potassium: 3.9 mmol/L (ref 3.5–5.1)
Sodium: 141 mmol/L (ref 135–145)

## 2017-02-14 MED ORDER — DILTIAZEM HCL 30 MG PO TABS: ORAL_TABLET | ORAL | 2 refills | Status: DC

## 2017-02-14 NOTE — Patient Instructions (Signed)
Your physician has recommended you make the following change in your medication:   1)Cardizem 30mg  -- take 1 tablet every 4 hours AS NEEDED for afib heart rate >100 as long as top number of blood pressure >100.     Call Butch Penny on Monday.  [phone]

## 2017-02-14 NOTE — Progress Notes (Signed)
Primary Care Physician: [name], MD Referring Physician: Dr. Levan Hurst is a 46 y.o. male with a h/o paroxysmal afib on flecainide. He feels fatigue win afib. He returned to afib this past Saturday with unknown trigger. He continues on flecainide 100 mg bid and xarelto without missed doses in drug. He is confused with his BP meds managed by his PCP. It appears as though he was to come off lisinopril and spironolactone in February  and start amlodipine but he did not do this.   Today, he denies symptoms of palpitations, chest pain, shortness of breath, orthopnea, PND, lower extremity edema, dizziness, presyncope, syncope, or neurologic sequela. The patient is tolerating medications without difficulties and is otherwise without complaint today.   Past Medical History:  Diagnosis Date  . Arthritis    legs, back   . GERD (gastroesophageal reflux disease)   . Hypertension   . Migraine   . Persistent atrial fibrillation [facility])    Past Surgical History:  Procedure Laterality Date  . CARDIOVERSION N/A 09/13/2016   Procedure: CARDIOVERSION;  Surgeon: Fay Records, MD;  [facility] ENDOSCOPY;  Service: Cardiovascular;  Laterality: N/A;  . CARDIOVERSION N/A 10/02/2016   Procedure: CARDIOVERSION;  Surgeon: Larey Dresser, MD;  Location: [place];  Service: Cardiovascular;  Laterality: N/A;  . HARDWARE REMOVAL Right 12/12/2015   Procedure: HARDWARE REMOVAL RIGHT TIBIAL;  Surgeon: Altamese , MD;  Location: [place];  Service: Orthopedics;  Laterality: Right;  . KNEE SURGERY     right  . TIBIA FRACTURE SURGERY  YRS AGO   right  . TOTAL KNEE ARTHROPLASTY Right 03/19/2016   Procedure: RIGHT TOTAL KNEE ARTHROPLASTY;  Surgeon: Paralee Cancel, MD;  Location: [facility];  Service: Orthopedics;  Laterality: Right;  Marland Kitchen VASECTOMY      Current Outpatient Prescriptions  Medication Sig Dispense Refill  . ALPRAZolam (XANAX) 0.25 MG tablet TAKE 1 TABLET BY MOUTH TWICE A DAY AS  NEEDED FOR ANXIETY 30 tablet 0  . carvedilol (COREG) 25 MG tablet TAKE 1 TABLET (25 MG TOTAL) BY MOUTH 2 (TWO) TIMES DAILY. 180 tablet 0  . esomeprazole (NEXIUM) 20 MG capsule Take 20 mg by mouth daily before breakfast.     . flecainide (TAMBOCOR) 100 MG tablet TAKE 1 TABLET (100 MG TOTAL) BY MOUTH 2 (TWO) TIMES DAILY. 180 tablet 0  . lisinopril (PRINIVIL,ZESTRIL) 40 MG tablet Take 1 tablet (40 mg total) by mouth daily. 90 tablet 2  . rivaroxaban (XARELTO) 20 MG TABS tablet Take 1 tablet (20 mg total) by mouth daily with supper. 90 tablet 3  . spironolactone (ALDACTONE) 25 MG tablet 1 tablet daily.  3  . diltiazem (CARDIZEM) 30 MG tablet Take 1 tablet every 4 hours AS NEEDED for heart rate over 100 45 tablet 2   No current facility-administered medications for this encounter.     No Known Allergies  Social History   Social History  . Marital status: Married    Spouse name: N/A  . Number of children: N/A  . Years of education: N/A   Occupational History  . Not on file.   Social History Main Topics  . Smoking status: Former Research scientist (life sciences)  . Smokeless tobacco: Former Systems developer    Types: Chew  . Alcohol use Yes     Comment: occasional glass of wine   . Drug use: No  . Sexual activity: Not on file   Other Topics Concern  . Not on file   Social History  Narrative  . No narrative on file    Family History  Problem Relation Age of Onset  . Hypertension Maternal Grandmother   . Cancer Maternal Grandfather     lung ca    ROS- All systems are reviewed and negative except as per the HPI above  Physical Exam: Vitals:   02/14/17 0907  BP: 122/88  Pulse: (!) 111  Weight: 289 lb (131.1 kg)  Height: 5\' 11"  (1.803 m)   Wt Readings from Last 3 Encounters:  02/14/17 289 lb (131.1 kg)  12/30/16 288 lb 6.4 oz (130.8 kg)  12/04/16 286 lb 3.2 oz (129.8 kg)    Labs: Lab Results  Component Value Date   NA 141 02/14/2017   K 3.9 02/14/2017   CL 106 02/14/2017   CO2 25 02/14/2017    GLUCOSE 101 (H) 02/14/2017   BUN 18 02/14/2017   CREATININE 1.24 02/14/2017   CALCIUM 8.8 (L) 02/14/2017   MG 1.9 02/14/2017   Lab Results  Component Value Date   INR 1.3 (H) 10/01/2016   Lab Results  Component Value Date   CHOL 168 07/01/2014   HDL 38.00 (L) 07/01/2014   LDLCALC 115 (H) 07/01/2014   TRIG 77.0 07/01/2014     GEN- The patient is well appearing, alert and oriented x 3 today.   Head- normocephalic, atraumatic Eyes-  Sclera clear, conjunctiva pink Ears- hearing intact Oropharynx- clear Neck- supple, no JVP Lymph- no cervical lymphadenopathy Lungs- Clear to ausculation bilaterally, normal work of breathing Heart- irregular rate and rhythm, no murmurs, rubs or gallops, PMI not laterally displaced GI- soft, NT, ND, + BS Extremities- no clubbing, cyanosis, or edema MS- no significant deformity or atrophy Skin- no rash or lesion Psych- euthymic mood, full affect Neuro- strength and sensation are intact  EKG-afib at 111 bpm, qrs int 98 ms, qtc 421 ms Epic records reviewed    Assessment and Plan: 1. Paroxysmal afib Pt is not sure how he wants to proceed  Offered a cardioversion but for now he does not want to go this route as he has had several in the past Offered AAD's he is not about the  3 day hospitalization Also discussed ablation which Dr. Rayann Heman has discussed before with him He would like to discuss with his wife  For now with rx cardizem 30 mg to use q 4 hours as needed for HR over 100 and BP over  100 to use over the weekend  2. HTN Will discuss with PCP to what drugs he would prefer for him to be on  He will call the office for his decision  to restore SR on Monday  Chisa Kushner C. Kaylub Detienne, [facility] [address] [place], [place] [address] [phone]

## 2017-02-19 ENCOUNTER — Other Ambulatory Visit: Payer: Self-pay | Admitting: Internal Medicine

## 2017-02-19 ENCOUNTER — Encounter: Payer: Self-pay | Admitting: Internal Medicine

## 2017-02-19 ENCOUNTER — Ambulatory Visit (INDEPENDENT_AMBULATORY_CARE_PROVIDER_SITE_OTHER): Payer: BLUE CROSS/BLUE SHIELD | Admitting: Internal Medicine

## 2017-02-19 VITALS — BP 126/90 | HR 76 | Ht 71.0 in | Wt 294.0 lb

## 2017-02-19 DIAGNOSIS — I10 Essential (primary) hypertension: Secondary | ICD-10-CM

## 2017-02-19 DIAGNOSIS — I4819 Other persistent atrial fibrillation: Secondary | ICD-10-CM

## 2017-02-19 DIAGNOSIS — I481 Persistent atrial fibrillation: Secondary | ICD-10-CM

## 2017-02-19 DIAGNOSIS — E2609 Other primary hyperaldosteronism: Secondary | ICD-10-CM

## 2017-02-19 NOTE — Patient Instructions (Signed)
Medication Instructions:  Your physician recommends that you continue on your current medications as directed. Please refer to the Current Medication list given to you today.   Labwork: None ordered   Testing/Procedures: None ordered   Follow-Up: Your physician recommends that you schedule a follow-up appointment in: 6 weeks with Dr Allred   Any Other Special Instructions Will Be Listed Below (If Applicable).     If you need a refill on your cardiac medications before your next appointment, please call your pharmacy.   

## 2017-02-19 NOTE — Progress Notes (Signed)
PCP: [name], MD  Gregory Barrett is a 46 y.o. male who presents today for routine electrophysiology followup.  He recently had afib with RVR with significant symptoms of palpitations, fatigue and SOB lasting 10 days.  He converted back to sinus rhythm yesterday.  He has been found to have plasma aldo/renin level of 135 and is being evaluated by Dr Burnice Logan for hyperaldosteronism.   Today, he denies symptoms of palpitations, chest pain, shortness of breath,  lower extremity edema, dizziness, presyncope, or syncope.  The patient is otherwise without complaint today.   Past Medical History:  Diagnosis Date  . Arthritis    legs, back   . GERD (gastroesophageal reflux disease)   . Hypertension   . Migraine   . Persistent atrial fibrillation [facility])    Past Surgical History:  Procedure Laterality Date  . CARDIOVERSION N/A 09/13/2016   Procedure: CARDIOVERSION;  Surgeon: Fay Records, MD;  Location: [facility] ENDOSCOPY;  Service: Cardiovascular;  Laterality: N/A;  . CARDIOVERSION N/A 10/02/2016   Procedure: CARDIOVERSION;  Surgeon: Larey Dresser, MD;  Location: [place];  Service: Cardiovascular;  Laterality: N/A;  . HARDWARE REMOVAL Right 12/12/2015   Procedure: HARDWARE REMOVAL RIGHT TIBIAL;  Surgeon: Altamese [place], MD;  Location: [place];  Service: Orthopedics;  Laterality: Right;  . KNEE SURGERY     right  . TIBIA FRACTURE SURGERY  YRS AGO   right  . TOTAL KNEE ARTHROPLASTY Right 03/19/2016   Procedure: RIGHT TOTAL KNEE ARTHROPLASTY;  Surgeon: Paralee Cancel, MD;  Location[facility];  Service: Orthopedics;  Laterality: Right;  Marland Kitchen VASECTOMY      ROS- all systems are reviewed and negatives except as per HPI above  Current Outpatient Prescriptions  Medication Sig Dispense Refill  . ALPRAZolam (XANAX) 0.25 MG tablet TAKE 1 TABLET BY MOUTH TWICE A DAY AS NEEDED FOR ANXIETY 30 tablet 0  . carvedilol (COREG) 25 MG tablet TAKE 1 TABLET (25 MG TOTAL) BY MOUTH 2 (TWO) TIMES DAILY. 180  tablet 0  . diltiazem (CARDIZEM) 30 MG tablet Take 1 tablet every 4 hours AS NEEDED for heart rate over 100 45 tablet 2  . esomeprazole (NEXIUM) 20 MG capsule Take 20 mg by mouth daily before breakfast.     . flecainide (TAMBOCOR) 100 MG tablet TAKE 1 TABLET (100 MG TOTAL) BY MOUTH 2 (TWO) TIMES DAILY. 180 tablet 0  . rivaroxaban (XARELTO) 20 MG TABS tablet Take 1 tablet (20 mg total) by mouth daily with supper. 90 tablet 3  . spironolactone (ALDACTONE) 25 MG tablet 1 tablet daily.  3   No current facility-administered medications for this visit.     Physical Exam: Vitals:   02/19/17 1358  BP: 126/90  Pulse: 76  SpO2: 97%  Weight: 294 lb (133.4 kg)  Height: 5\' 11"  (1.803 m)    GEN- The patient is overweight appearing, alert and oriented x 3 today.   Head- normocephalic, atraumatic Eyes-  Sclera clear, conjunctiva pink Ears- hearing intact Oropharynx- clear Lungs- Clear to ausculation bilaterally, normal work of breathing Heart- Regular rate and rhythm, no murmurs, rubs or gallops, PMI not laterally displaced GI- soft, NT, ND, + BS Extremities- no clubbing, cyanosis, or edema  ekg today reveals sinus rhythm 76 bpm, PR 202 msec, QRS 110 msec, QTc 443 msec, normal ekg  Assessment and Plan:  1. Persistent atrial fibrillation Now back in sinus chads2vasc score is 1.  Continue xarelto I would like to have his hyperaldo evaluation prior to consideration  of afib ablation.  I am only somewhat optimistic however that his afib will resolve with treatment.  Certainly if his afib persists after correction of this issue then I would advise ablation. May need to consider sotalol or amiodarone in the interim if AF continues to be an issue. He will contact the afib clinic with further AF issues.  2. HTN Elevated aldo/renin ratio discussed today with Dr Burnice Logan by phone. Dr Raliegh Ip will assist with this workup  3. Obesity Body mass index is 41 kg/m. Weight loss advised  Today, I have  spent 25  minutes with the patient discussing afib management options .  More than 50% of the visit time today was spent on this issue.  Return to see me in 6 weeks Contact the af clinic with issues in the interim.  Thompson Grayer MD, [facility] 02/19/2017 2:26 PM

## 2017-02-25 ENCOUNTER — Encounter: Payer: Self-pay | Admitting: Internal Medicine

## 2017-02-25 DIAGNOSIS — E2609 Other primary hyperaldosteronism: Secondary | ICD-10-CM

## 2017-02-25 NOTE — Telephone Encounter (Signed)
Pt indicates he would like to have Adrenal CT done. Order placed. Pt advised that someone will call him to schedule.

## 2017-03-06 ENCOUNTER — Encounter: Payer: Self-pay | Admitting: Internal Medicine

## 2017-03-06 ENCOUNTER — Ambulatory Visit (INDEPENDENT_AMBULATORY_CARE_PROVIDER_SITE_OTHER)
Admission: RE | Admit: 2017-03-06 | Discharge: 2017-03-06 | Disposition: A | Discharge status: Home / Self Care | Payer: BLUE CROSS/BLUE SHIELD | Source: Ambulatory Visit | Attending: Internal Medicine | Admitting: Internal Medicine

## 2017-03-06 DIAGNOSIS — E2609 Other primary hyperaldosteronism: Secondary | ICD-10-CM

## 2017-03-06 DIAGNOSIS — N281 Cyst of kidney, acquired: Secondary | ICD-10-CM | POA: Diagnosis not present

## 2017-03-06 MED ORDER — IOPAMIDOL (ISOVUE-300) INJECTION 61%
100.0000 mL | Freq: Once | INTRAVENOUS | Status: AC | PRN
  Administered 2017-03-06: 100 mL via INTRAVENOUS

## 2017-03-10 ENCOUNTER — Ambulatory Visit: Payer: BLUE CROSS/BLUE SHIELD | Admitting: Internal Medicine

## 2017-03-13 ENCOUNTER — Ambulatory Visit (HOSPITAL_COMMUNITY)
Admission: RE | Admit: 2017-03-13 | Discharge: 2017-03-13 | Disposition: A | Discharge status: Home / Self Care | Payer: BLUE CROSS/BLUE SHIELD | Source: Ambulatory Visit | Attending: Nurse Practitioner | Admitting: Nurse Practitioner

## 2017-03-13 ENCOUNTER — Encounter (HOSPITAL_COMMUNITY): Payer: Self-pay | Admitting: Nurse Practitioner

## 2017-03-13 VITALS — BP 126/92 | HR 104 | Ht 71.0 in | Wt 291.2 lb

## 2017-03-13 DIAGNOSIS — I10 Essential (primary) hypertension: Secondary | ICD-10-CM | POA: Diagnosis not present

## 2017-03-13 DIAGNOSIS — M1990 Unspecified osteoarthritis, unspecified site: Secondary | ICD-10-CM | POA: Diagnosis not present

## 2017-03-13 DIAGNOSIS — Z87891 Personal history of nicotine dependence: Secondary | ICD-10-CM | POA: Diagnosis not present

## 2017-03-13 DIAGNOSIS — I4819 Other persistent atrial fibrillation: Secondary | ICD-10-CM

## 2017-03-13 DIAGNOSIS — I4891 Unspecified atrial fibrillation: Secondary | ICD-10-CM | POA: Diagnosis present

## 2017-03-13 DIAGNOSIS — Z79899 Other long term (current) drug therapy: Secondary | ICD-10-CM | POA: Insufficient documentation

## 2017-03-13 DIAGNOSIS — I481 Persistent atrial fibrillation: Secondary | ICD-10-CM | POA: Diagnosis not present

## 2017-03-13 DIAGNOSIS — K219 Gastro-esophageal reflux disease without esophagitis: Secondary | ICD-10-CM | POA: Insufficient documentation

## 2017-03-13 DIAGNOSIS — Z7901 Long term (current) use of anticoagulants: Secondary | ICD-10-CM | POA: Diagnosis not present

## 2017-03-13 NOTE — Progress Notes (Signed)
Primary Care Physician: Marletta Lor, MD Referring Physician: Dr. Levan Hurst is a 46 y.o. male with a h/o paroxysmal afib on flecainide. He has recently gone thru evalution for possible primary hyperaldosteronism. Dr. Rayann Heman had seen pt in the past and offered to ablate but wanted this evaluated first. He has been given the go ahead by his PCP for ablation. Pt is ready to have this done as he is having days where the afib is making him lightheaded and fatigued.He continues on flecainide but he feels that he is in afib most of the time now.  Today, he denies symptoms of  chest pain, orthopnea, PND, lower extremity edema, dizziness, presyncope, syncope, or neurologic sequela. Positive for sweating, lightheadedness, shortness of breath, fatigue. The patient is tolerating medications without difficulties and is otherwise without complaint today.   Past Medical History:  Diagnosis Date  . Arthritis    legs, back   . GERD (gastroesophageal reflux disease)   . Hypertension   . Migraine   . Persistent atrial fibrillation [facility])    Past Surgical History:  Procedure Laterality Date  . CARDIOVERSION N/A 09/13/2016   Procedure: CARDIOVERSION;  Surgeon: Fay Records, MD;  Location: [facility] ENDOSCOPY;  Service: Cardiovascular;  Laterality: N/A;  . CARDIOVERSION N/A 10/02/2016   Procedure: CARDIOVERSION;  Surgeon: Larey Dresser, MD;  Location: [place];  Service: Cardiovascular;  Laterality: N/A;  . HARDWARE REMOVAL Right 12/12/2015   Procedure: HARDWARE REMOVAL RIGHT TIBIAL;  Surgeon: Altamese Tallmadge, MD;  Location: [place];  Service: Orthopedics;  Laterality: Right;  . KNEE SURGERY     right  . TIBIA FRACTURE SURGERY  YRS AGO   right  . TOTAL KNEE ARTHROPLASTY Right 03/19/2016   Procedure: RIGHT TOTAL KNEE ARTHROPLASTY;  Surgeon: Paralee Cancel, MD;  Location: [facility];  Service: Orthopedics;  Laterality: Right;  Marland Kitchen VASECTOMY      Current Outpatient Prescriptions  Medication Sig  Dispense Refill  . ALPRAZolam (XANAX) 0.25 MG tablet TAKE 1 TABLET BY MOUTH TWICE A DAY AS NEEDED FOR ANXIETY 30 tablet 0  . carvedilol (COREG) 25 MG tablet TAKE 1 TABLET (25 MG TOTAL) BY MOUTH 2 (TWO) TIMES DAILY. 180 tablet 0  . diltiazem (CARDIZEM) 30 MG tablet Take 1 tablet every 4 hours AS NEEDED for heart rate over 100 45 tablet 2  . esomeprazole (NEXIUM) 20 MG capsule Take 20 mg by mouth daily before breakfast.     . flecainide (TAMBOCOR) 100 MG tablet TAKE 1 TABLET (100 MG TOTAL) BY MOUTH 2 (TWO) TIMES DAILY. 180 tablet 0  . rivaroxaban (XARELTO) 20 MG TABS tablet Take 1 tablet (20 mg total) by mouth daily with supper. 90 tablet 3  . spironolactone (ALDACTONE) 25 MG tablet 1 tablet daily.  3   No current facility-administered medications for this encounter.     No Known Allergies  Social History   Social History  . Marital status: Married    Spouse name: N/A  . Number of children: N/A  . Years of education: N/A   Occupational History  . Not on file.   Social History Main Topics  . Smoking status: Former Research scientist (life sciences)  . Smokeless tobacco: Former Systems developer    Types: Chew  . Alcohol use Yes     Comment: occasional glass of wine   . Drug use: No  . Sexual activity: Not on file   Other Topics Concern  . Not on file   Social History Narrative  .  No narrative on file    Family History  Problem Relation Age of Onset  . Hypertension Maternal Grandmother   . Cancer Maternal Grandfather        lung ca    ROS- All systems are reviewed and negative except as per the HPI above  Physical Exam: Vitals:   03/13/17 1118  BP: (!) 126/92  Pulse: (!) 104  Weight: 291 lb 3.2 oz (132.1 kg)  Height: 5\' 11"  (1.803 m)   Wt Readings from Last 3 Encounters:  03/13/17 291 lb 3.2 oz (132.1 kg)  02/19/17 294 lb (133.4 kg)  02/14/17 289 lb (131.1 kg)    Labs: Lab Results  Component Value Date   NA 141 02/14/2017   K 3.9 02/14/2017   CL 106 02/14/2017   CO2 25 02/14/2017   GLUCOSE  101 (H) 02/14/2017   BUN 18 02/14/2017   CREATININE 1.24 02/14/2017   CALCIUM 8.8 (L) 02/14/2017   MG 1.9 02/14/2017   Lab Results  Component Value Date   INR 1.3 (H) 10/01/2016   Lab Results  Component Value Date   CHOL 168 07/01/2014   HDL 38.00 (L) 07/01/2014   LDLCALC 115 (H) 07/01/2014   TRIG 77.0 07/01/2014     GEN- The patient is well appearing, alert and oriented x 3 today.   Head- normocephalic, atraumatic Eyes-  Sclera clear, conjunctiva pink Ears- hearing intact Oropharynx- clear Neck- supple, no JVP Lymph- no cervical lymphadenopathy Lungs- Clear to ausculation bilaterally, normal work of breathing Heart- irregular rate and rhythm, no murmurs, rubs or gallops, PMI not laterally displaced GI- soft, NT, ND, + BS Extremities- no clubbing, cyanosis, or edema MS- no significant deformity or atrophy Skin- no rash or lesion Psych- euthymic mood, full affect Neuro- strength and sensation are intact  EKG-afib with RVR at 104 ms, qrs int 100 ms, qtc 473 ms Epic records reviewed    Assessment and Plan: 1. Paroxysmal to persisitent afib Pt is wanting to go ahead with ablation and has been given go ahead form his pcp having adrenals recently evaluated, he is to continue spironolactone For now with rx cardizem 30 mg to use q 4 hours as needed for HR over 100   Continue carvedilol  Continue xarelto for a chadsvasc score of 1(htn)  2. HTN Stable  F/u with Dr. Rayann Heman for ablation  Geroge Baseman. Jaryn Rosko, [facility] [address] [place], [place] [address] [phone]

## 2017-03-14 ENCOUNTER — Other Ambulatory Visit: Payer: Self-pay | Admitting: Internal Medicine

## 2017-03-19 ENCOUNTER — Ambulatory Visit (INDEPENDENT_AMBULATORY_CARE_PROVIDER_SITE_OTHER): Payer: BLUE CROSS/BLUE SHIELD | Admitting: Internal Medicine

## 2017-03-19 ENCOUNTER — Encounter: Payer: Self-pay | Admitting: Internal Medicine

## 2017-03-19 VITALS — BP 124/78 | HR 112 | Ht 71.0 in | Wt 292.4 lb

## 2017-03-19 DIAGNOSIS — I481 Persistent atrial fibrillation: Secondary | ICD-10-CM

## 2017-03-19 DIAGNOSIS — I480 Paroxysmal atrial fibrillation: Secondary | ICD-10-CM

## 2017-03-19 DIAGNOSIS — I4819 Other persistent atrial fibrillation: Secondary | ICD-10-CM

## 2017-03-19 DIAGNOSIS — I10 Essential (primary) hypertension: Secondary | ICD-10-CM | POA: Diagnosis not present

## 2017-03-19 NOTE — Patient Instructions (Addendum)
Medication Instructions:  Your physician has recommended you make the following change in your medication:  1) Stop Flecainide   Labwork: Your physician recommends that you return for lab work on 03/20/17 at 430   Testing/Procedures: Your physician has requested that you have a TEE. During a TEE, sound waves are used to create images of your heart. It provides your doctor with information about the size and shape of your heart and how well your heart's chambers and valves are working. In this test, a transducer is attached to the end of a flexible tube that's guided down your throat and into your esophagus (the tube leading from you mouth to your stomach) to get a more detailed image of your heart. You are not awake for the procedure. Please see the instruction sheet given to you today. For further information please visit [email or web address]  Your physician has recommended that you have an ablation. Catheter ablation is a medical procedure used to treat some cardiac arrhythmias (irregular heartbeats). During catheter ablation, a long, thin, flexible tube is put into a blood vessel in your groin (upper thigh), or neck. This tube is called an ablation catheter. It is then guided to your heart through the blood vessel. Radio frequency waves destroy small areas of heart tissue where abnormal heartbeats may cause an arrhythmia to start. Please see the instruction sheet given to you today.---04/03/17   Please arrive at [facility] at 6:00am Do not eat or drink after midnight the night prior to the procedure Do not take any medications the morning of the test Plan for one night stay Will need someone to drive you home at discharge    Follow-Up: Your physician recommends that you schedule a follow-up appointment in: 4 weeks from 04/03/17 with Roderic Palau, NP and 3 months from 04/03/17 with Dr Rayann Heman   Any Other Special Instructions Will Be Listed Below  (If Applicable).     If you need a refill on your cardiac medications before your next appointment, please call your pharmacy.

## 2017-03-19 NOTE — Progress Notes (Signed)
Electrophysiology Office Note   Date:  03/19/2017   ID:  Shin, Lamour [birth year], MRN [id number]  PCP:  Marletta Lor, MD    Primary Electrophysiologist: Thompson Grayer, MD    Chief Complaint  Patient presents with  . Atrial Fibrillation     History of Present Illness: Gregory Barrett is a 46 y.o. male who presents today for electrophysiology evaluation.   Since his last visit, he continues to have worsening afib.  He has symptoms of SOB and fatigue.  He is not tolerating afib very well.  He is now in afib all of the time.  Recent CT was not revealing of adrenal tumor.  He is scheduled to see Endocrine soon.  Today, he denies symptoms of palpitations, chest pain,  orthopnea, PND, lower extremity edema, claudication, presyncope, syncope, bleeding, or neurologic sequela. The patient is tolerating medications without difficulties and is otherwise without complaint today.    Past Medical History:  Diagnosis Date  . Arthritis    legs, back   . GERD (gastroesophageal reflux disease)   . Hypertension   . Migraine   . Persistent atrial fibrillation [facility])    Past Surgical History:  Procedure Laterality Date  . CARDIOVERSION N/A 09/13/2016   Procedure: CARDIOVERSION;  Surgeon: Fay Records, MD;  Location: [facility] ENDOSCOPY;  Service: Cardiovascular;  Laterality: N/A;  . CARDIOVERSION N/A 10/02/2016   Procedure: CARDIOVERSION;  Surgeon: Larey Dresser, MD;  Location: [place];  Service: Cardiovascular;  Laterality: N/A;  . HARDWARE REMOVAL Right 12/12/2015   Procedure: HARDWARE REMOVAL RIGHT TIBIAL;  Surgeon: Altamese [place], MD;  Location: [place];  Service: Orthopedics;  Laterality: Right;  . KNEE SURGERY     right  . TIBIA FRACTURE SURGERY  YRS AGO   right  . TOTAL KNEE ARTHROPLASTY Right 03/19/2016   Procedure: RIGHT TOTAL KNEE ARTHROPLASTY;  Surgeon: Paralee Cancel, MD;  Location: [facility];  Service: Orthopedics;  Laterality: Right;  Marland Kitchen VASECTOMY       Current Outpatient  Prescriptions  Medication Sig Dispense Refill  . ALPRAZolam (XANAX) 0.25 MG tablet TAKE 1 TABLET BY MOUTH TWICE A DAY AS NEEDED FOR ANXIETY 30 tablet 0  . carvedilol (COREG) 25 MG tablet TAKE 1 TABLET (25 MG TOTAL) BY MOUTH 2 (TWO) TIMES DAILY. 180 tablet 0  . diltiazem (CARDIZEM) 30 MG tablet Take 1 tablet by mouth every 4 hours AS NEEDED for heart rate over 100    . esomeprazole (NEXIUM) 20 MG capsule Take 20 mg by mouth daily before breakfast.     . flecainide (TAMBOCOR) 100 MG tablet TAKE 1 TABLET (100 MG TOTAL) BY MOUTH 2 (TWO) TIMES DAILY. 180 tablet 0  . rivaroxaban (XARELTO) 20 MG TABS tablet Take 1 tablet (20 mg total) by mouth daily with supper. 90 tablet 3  . spironolactone (ALDACTONE) 25 MG tablet Take 1 tablet by mouth daily.   3   No current facility-administered medications for this visit.     Allergies:   Patient has no known allergies.   Social History:  The patient  reports that he has quit smoking. He has quit using smokeless tobacco. His smokeless tobacco use included Chew. He reports that he drinks alcohol. He reports that he does not use drugs.   Family History:  The patient's  family history includes Cancer in his maternal grandfather; Hypertension in his maternal grandmother.    ROS:  Please see the history of present illness.   All other systems are  personally reviewed and negative.    PHYSICAL EXAM: VS:  BP 124/78   Pulse (!) 112   Ht 5\' 11"  (1.803 m)   Wt 292 lb 6.4 oz (132.6 kg)   SpO2 96%   BMI 40.78 kg/m  , BMI Body mass index is 40.78 kg/m. GEN: Well nourished, well developed, in no acute distress  HEENT: normal  Neck: no JVD, carotid bruits, or masses Cardiac: iRRR; no murmurs, rubs, or gallops,no edema  Respiratory:  clear to auscultation bilaterally, normal work of breathing GI: soft, nontender, nondistended, + BS MS: no deformity or atrophy  Skin: warm and dry  Neuro:  Strength and sensation are intact Psych: euthymic mood, full  affect  EKG:  EKG is ordered today. The ekg ordered today is personally reviewed and shows afib, V rate 112 bpm,    Recent Labs: 08/06/2016: TSH 2.15 10/01/2016: Hemoglobin 16.1; Platelets 398 02/14/2017: BUN 18; Creatinine, Ser 1.24; Magnesium 1.9; Potassium 3.9; Sodium 141  personally reviewed   Lipid Panel     Component Value Date/Time   CHOL 168 07/01/2014 1019   TRIG 77.0 07/01/2014 1019   HDL 38.00 (L) 07/01/2014 1019   CHOLHDL 4 07/01/2014 1019   VLDL 15.4 07/01/2014 1019   LDLCALC 115 (H) 07/01/2014 1019   personally reviewed   Wt Readings from Last 3 Encounters:  03/19/17 292 lb 6.4 oz (132.6 kg)  03/13/17 291 lb 3.2 oz (132.1 kg)  02/19/17 294 lb (133.4 kg)      Other studies personally reviewed: Additional studies/ records that were reviewed today include: AF clinic notes, recent CT  Review of the above records today demonstrates: as above   ASSESSMENT AND PLAN:  1.  Persistent afib The patient has symptomatic persistent afib He has failed medical therapy with flecainide. Therapeutic strategies for afib including medicine (tikosyn, sotalol, amiodarone) and ablation were discussed in detail with the patient today. Risk, benefits, and alternatives to EP study and radiofrequency ablation for afib were also discussed in detail today. These risks include but are not limited to stroke, bleeding, vascular damage, tamponade, perforation, damage to the esophagus, lungs, and other structures, pulmonary vein stenosis, worsening renal function, and death. The patient understands these risk and wishes to proceed.  We will therefore proceed with catheter ablation at the next available time.  Will plan TEE prior to ablation.  2. HTN Plans to see endocrine are noted No changes today  3. Obesity Body mass index is 40.78 kg/m. Weight loss is advised   Signed, Thompson Grayer, MD  03/19/2017 4:51 PM     [address] [place]  [address] [phone] (office) [phone] (fax)

## 2017-03-20 ENCOUNTER — Other Ambulatory Visit: Payer: BLUE CROSS/BLUE SHIELD | Admitting: *Deleted

## 2017-03-20 DIAGNOSIS — I480 Paroxysmal atrial fibrillation: Secondary | ICD-10-CM | POA: Diagnosis not present

## 2017-03-21 LAB — CBC WITH DIFFERENTIAL/PLATELET
BASOS ABS: 0.1 10*3/uL (ref 0.0–0.2)
Basos: 1 %
EOS (ABSOLUTE): 0.2 10*3/uL (ref 0.0–0.4)
Eos: 3 %
Hematocrit: 41.7 % (ref 37.5–51.0)
Hemoglobin: 14.3 g/dL (ref 13.0–17.7)
Immature Grans (Abs): 0 10*3/uL (ref 0.0–0.1)
Immature Granulocytes: 0 %
LYMPHS ABS: 2.4 10*3/uL (ref 0.7–3.1)
Lymphs: 28 %
MCH: 30.6 pg (ref 26.6–33.0)
MCHC: 34.3 g/dL (ref 31.5–35.7)
MCV: 89 fL (ref 79–97)
MONOS ABS: 0.9 10*3/uL (ref 0.1–0.9)
Monocytes: 10 %
NEUTROS ABS: 5 10*3/uL (ref 1.4–7.0)
Neutrophils: 58 %
PLATELETS: 259 10*3/uL (ref 150–379)
RBC: 4.68 x10E6/uL (ref 4.14–5.80)
RDW: 14 % (ref 12.3–15.4)
WBC: 8.6 10*3/uL (ref 3.4–10.8)

## 2017-03-21 LAB — BASIC METABOLIC PANEL
BUN / CREAT RATIO: 15 (ref 9–20)
BUN: 19 mg/dL (ref 6–24)
CHLORIDE: 104 mmol/L (ref 96–106)
CO2: 23 mmol/L (ref 18–29)
Calcium: 8.8 mg/dL (ref 8.7–10.2)
Creatinine, Ser: 1.29 mg/dL — ABNORMAL HIGH (ref 0.76–1.27)
GFR calc non Af Amer: 67 mL/min/{1.73_m2} (ref >59)
GFR, EST AFRICAN AMERICAN: 78 mL/min/{1.73_m2} (ref >59)
GLUCOSE: 83 mg/dL (ref 65–99)
POTASSIUM: 4.1 mmol/L (ref 3.5–5.2)
Sodium: 142 mmol/L (ref 134–144)

## 2017-03-26 ENCOUNTER — Encounter: Payer: Self-pay | Admitting: Internal Medicine

## 2017-04-02 ENCOUNTER — Encounter: Payer: Self-pay | Admitting: Endocrinology

## 2017-04-02 ENCOUNTER — Telehealth: Payer: Self-pay | Admitting: Nurse Practitioner

## 2017-04-02 ENCOUNTER — Ambulatory Visit (INDEPENDENT_AMBULATORY_CARE_PROVIDER_SITE_OTHER): Payer: BLUE CROSS/BLUE SHIELD | Admitting: Endocrinology

## 2017-04-02 ENCOUNTER — Ambulatory Visit: Payer: BLUE CROSS/BLUE SHIELD | Admitting: Internal Medicine

## 2017-04-02 VITALS — BP 122/84 | HR 85 | Ht 71.0 in | Wt 290.0 lb

## 2017-04-02 DIAGNOSIS — I10 Essential (primary) hypertension: Secondary | ICD-10-CM

## 2017-04-02 MED ORDER — DILTIAZEM HCL ER 120 MG PO CP24: 120.0000 mg | ORAL_CAPSULE | Freq: Every day | ORAL | 3 refills | Status: DC

## 2017-04-02 NOTE — Progress Notes (Signed)
Subjective:    Patient ID: Gregory Barrett, male    DOB: Feb 04, 1971, 46 y.o.   MRN: [id number]  HPI Pt is referred by Dr Burnice Logan, for hypokalemia.  Pt was first noted to have mild HTN in 2012, and hypokalemia in 2015.  he has intermittent palpitations in the chest, but no assoc numbness.  She has no h/o renal disease, licorice, chemotx, special diet, steroids, adrenal disease, malabsorption, herbal supplements, or hypomagnesemia. He will have ablation procedure tomorrow, for AF.  Past Medical History:  Diagnosis Date  . Arthritis    legs, back   . GERD (gastroesophageal reflux disease)   . Hyperaldosteronism ([place])   . Hypertension   . Melanoma of lower back ([place]) 2000s  . Migraine   . Persistent atrial fibrillation ([place])   . Pneumonia    "a few times; last time ~ 2010" (04/03/2017)    Past Surgical History:  Procedure Laterality Date  . ATRIAL FIBRILLATION ABLATION N/A 04/03/2017   Procedure: Atrial Fibrillation Ablation;  Surgeon: Thompson Grayer, MD;  Location: [place] CV LAB;  Service: Cardiovascular;  Laterality: N/A;  . CARDIOVERSION N/A 09/13/2016   Procedure: CARDIOVERSION;  Surgeon: Fay Records, MD;  Location: [facility] ENDOSCOPY;  Service: Cardiovascular;  Laterality: N/A;  . CARDIOVERSION N/A 10/02/2016   Procedure: CARDIOVERSION;  Surgeon: Larey Dresser, MD;  Location: [place];  Service: Cardiovascular;  Laterality: N/A;  . HARDWARE REMOVAL Right 12/12/2015   Procedure: HARDWARE REMOVAL RIGHT TIBIAL;  Surgeon: Altamese [place], MD;  Location: [place];  Service: Orthopedics;  Laterality: Right;  . JOINT REPLACEMENT    . MELANOMA EXCISION     "back"  . ORIF NONUNION / MALUNION TIBIAL FRACTURE Right 2005   "put plates & screws in"  . TOTAL KNEE ARTHROPLASTY Right 03/19/2016   Procedure: RIGHT TOTAL KNEE ARTHROPLASTY;  Surgeon: Paralee Cancel, MD;  [facility];  Service: Orthopedics;  Laterality: Right;  Marland Kitchen VASECTOMY      Social History   Social History  . Marital  status: Married    Spouse name: N/A  . Number of children: N/A  . Years of education: N/A   Occupational History  . Not on file.   Social History Main Topics  . Smoking status: Former Smoker    Packs/day: 0.50    Years: 2.00    Types: Cigarettes    Quit date: 09/20/2010  . Smokeless tobacco: Former Systems developer    Types: Chew    Quit date: 11/03/2014  . Alcohol use 1.8 oz/week    3 Glasses of wine per week  . Drug use: No  . Sexual activity: Yes   Other Topics Concern  . Not on file   Social History Narrative  . No narrative on file    Current Facility-Administered Medications on File Prior to Visit  Medication Dose Route Frequency Provider Last Rate Last Dose  . 0.9 %  sodium chloride infusion  250 mL Intravenous PRN Allred, Jeneen Rinks, MD      . acetaminophen (TYLENOL) tablet 650 mg  650 mg Oral Q4H PRN Allred, Jeneen Rinks, MD      . ALPRAZolam Duanne Moron) tablet 0.25 mg  0.25 mg Oral BID PRN Thompson Grayer, MD      . Derrill Memo ON 04/04/2017] carvedilol (COREG) tablet 25 mg  25 mg Oral BID Allred, Jeneen Rinks, MD      . Derrill Memo ON 04/04/2017] diltiazem (DILACOR XR) 24 hr capsule 120 mg  120 mg Oral Daily Allred, Jeneen Rinks, MD      .  HYDROcodone-acetaminophen (NORCO/VICODIN) 5-325 MG per tablet 1-2 tablet  1-2 tablet Oral Q4H PRN Thompson Grayer, MD   2 tablet at 04/03/17 1655  . ondansetron (ZOFRAN) injection 4 mg  4 mg Intravenous Q6H PRN Thompson Grayer, MD   4 mg at 04/03/17 1106  . phenol (CHLORASEPTIC) mouth spray 1 spray  1 spray Mouth/Throat PRN Allred, Jeneen Rinks, MD      . Derrill Memo ON 04/04/2017] rivaroxaban (XARELTO) tablet 20 mg  20 mg Oral Daily Allred, James, MD      . sodium chloride flush (NS) 0.9 % injection 3 mL  3 mL Intravenous Q12H Allred, James, MD      . sodium chloride flush (NS) 0.9 % injection 3 mL  3 mL Intravenous PRN Thompson Grayer, MD       Current Outpatient Prescriptions on File Prior to Visit  Medication Sig Dispense Refill  . ALPRAZolam (XANAX) 0.25 MG tablet TAKE 1 TABLET BY MOUTH TWICE A DAY  AS NEEDED FOR ANXIETY 30 tablet 0  . carvedilol (COREG) 25 MG tablet TAKE 1 TABLET (25 MG TOTAL) BY MOUTH 2 (TWO) TIMES DAILY. 180 tablet 0  . diltiazem (CARDIZEM) 30 MG tablet Take 30 mg by mouth See admin instructions. Take 1 tablet by mouth every 4 hours AS NEEDED for heart rate over 100     . esomeprazole (NEXIUM) 20 MG capsule Take 20 mg by mouth daily before breakfast.     . rivaroxaban (XARELTO) 20 MG TABS tablet Take 1 tablet (20 mg total) by mouth daily with supper. (Patient taking differently: Take 20 mg by mouth daily. ) 90 tablet 3    No Known Allergies  Family History  Problem Relation Age of Onset  . Hypertension Maternal Grandmother   . Cancer Maternal Grandfather        lung ca  . Adrenal disorder Neg Hx     BP 122/84   Pulse 85   Ht 5\' 11"  (1.803 m)   Wt 290 lb (131.5 kg)   SpO2 96%   BMI 40.45 kg/m    Review of Systems Pt denies weight change, blurry vision, edema, diarrhea, n/v, difficulty with concentration, muscle weakness, heat intolerance, polyuria, food allergies, leg cramps, and seizure.  He has intermitt doe and diaphoresis.     Objective:   Physical Exam VS: see vs page GEN: no distress HEAD: head: no deformity eyes: no periorbital swelling, no proptosis external nose and ears are normal mouth: no lesion seen NECK: supple, thyroid is not enlarged CHEST WALL: no deformity LUNGS: clear to auscultation CV: reg rate and rhythm, no murmur ABD: abdomen is soft, nontender.  no hepatosplenomegaly.  not distended.  no hernia MUSCULOSKELETAL: muscle bulk and strength are grossly normal.  no obvious joint swelling.  gait is normal and steady EXTEMITIES: no deformity.  no edema PULSES: no carotid bruit NEURO:  cn 2-12 grossly intact.   readily moves all 4's.  sensation is intact to touch on all 4's SKIN:  Normal texture and temperature.  No rash or suspicious lesion is visible.   NODES:  None palpable at the neck PSYCH: alert, well-oriented.  Does not  appear anxious nor depressed.    CT: Bilateral adrenal glands are normal in appearance  PRA=0.14 Aldosterone=19  I have reviewed outside records, and summarized: Pt was noted to have elevated also/PRA ratio, and referred here.  He was eval by cardiol.  He had persist AF. flecanide did not help.       Assessment & Plan:  HTN, with elev aldo/renin ratio, new to me.  Generally, dx requires both elev aldo and high ratio.  Normal CT of adrenals does not rule out primary hyperaldosteronism, but makes it less likely. Palpitations: we'll check 24-HR urine for catechols and metas.   Patient Instructions  Please change the spironolactone to once-a-day diltiazem.  I have sent a prescription to your pharmacy. You can still take the as-needed diltiazem. Let's check a 24-HR urine test, for adrenaline. Please redo the blood test in 1-2 weeks, at Dr Truddie Hidden office Please come back for a follow-up appointment in 1 month.

## 2017-04-02 NOTE — Telephone Encounter (Signed)
AF ablation moved up to first case tomorrow. Spoke with patient. He is still in AF, will take Xarelto tomorrow morning.  Plan TEE on table in EP lab.  Dr Johnsie Cancel to do TEE, echo aware of scheduled TEE in EP lab, TEE cancelled in endo.   Instructions reviewed. Pt to arrive at [place], NP 04/02/2017 8:17 AM

## 2017-04-02 NOTE — Patient Instructions (Signed)
Please change the spironolactone to once-a-day diltiazem.  I have sent a prescription to your pharmacy. You can still take the as-needed diltiazem. Let's check a 24-HR urine test, for adrenaline. Please redo the blood test in 1-2 weeks, at Dr Truddie Hidden office Please come back for a follow-up appointment in 1 month.

## 2017-04-03 ENCOUNTER — Ambulatory Visit (HOSPITAL_COMMUNITY): Payer: BLUE CROSS/BLUE SHIELD | Admitting: Certified Registered Nurse Anesthetist

## 2017-04-03 ENCOUNTER — Ambulatory Visit (HOSPITAL_BASED_OUTPATIENT_CLINIC_OR_DEPARTMENT_OTHER): Payer: BLUE CROSS/BLUE SHIELD

## 2017-04-03 ENCOUNTER — Encounter (HOSPITAL_COMMUNITY)
Admission: RE | Disposition: A | Discharge status: Home / Self Care | Payer: Self-pay | Source: Ambulatory Visit | Attending: Internal Medicine

## 2017-04-03 ENCOUNTER — Ambulatory Visit (HOSPITAL_COMMUNITY)
Admission: RE | Admit: 2017-04-03 | Discharge: 2017-04-04 | Disposition: A | Discharge status: Home / Self Care | Payer: BLUE CROSS/BLUE SHIELD | Source: Ambulatory Visit | Attending: Internal Medicine | Admitting: Internal Medicine

## 2017-04-03 ENCOUNTER — Encounter (HOSPITAL_COMMUNITY): Payer: Self-pay | Admitting: Certified Registered Nurse Anesthetist

## 2017-04-03 DIAGNOSIS — Z720 Tobacco use: Secondary | ICD-10-CM | POA: Diagnosis not present

## 2017-04-03 DIAGNOSIS — K219 Gastro-esophageal reflux disease without esophagitis: Secondary | ICD-10-CM | POA: Diagnosis not present

## 2017-04-03 DIAGNOSIS — M1990 Unspecified osteoarthritis, unspecified site: Secondary | ICD-10-CM | POA: Insufficient documentation

## 2017-04-03 DIAGNOSIS — Z6841 Body Mass Index (BMI) 40.0 and over, adult: Secondary | ICD-10-CM | POA: Insufficient documentation

## 2017-04-03 DIAGNOSIS — I4891 Unspecified atrial fibrillation: Secondary | ICD-10-CM

## 2017-04-03 DIAGNOSIS — I10 Essential (primary) hypertension: Secondary | ICD-10-CM | POA: Insufficient documentation

## 2017-04-03 DIAGNOSIS — Q211 Atrial septal defect: Secondary | ICD-10-CM | POA: Diagnosis not present

## 2017-04-03 DIAGNOSIS — Z7901 Long term (current) use of anticoagulants: Secondary | ICD-10-CM | POA: Diagnosis not present

## 2017-04-03 DIAGNOSIS — G43909 Migraine, unspecified, not intractable, without status migrainosus: Secondary | ICD-10-CM | POA: Insufficient documentation

## 2017-04-03 DIAGNOSIS — I481 Persistent atrial fibrillation: Secondary | ICD-10-CM | POA: Diagnosis not present

## 2017-04-03 HISTORY — PX: ATRIAL FIBRILLATION ABLATION: EP1191

## 2017-04-03 HISTORY — DX: Hyperaldosteronism, unspecified: E26.9

## 2017-04-03 HISTORY — DX: Malignant melanoma of other part of trunk: C43.59

## 2017-04-03 HISTORY — DX: Pneumonia, unspecified organism: J18.9

## 2017-04-03 LAB — POCT ACTIVATED CLOTTING TIME
ACTIVATED CLOTTING TIME: 235 s
ACTIVATED CLOTTING TIME: 268 s
ACTIVATED CLOTTING TIME: 290 s
Activated Clotting Time: 186 seconds

## 2017-04-03 SURGERY — ECHOCARDIOGRAM, TRANSESOPHAGEAL
Anesthesia: Moderate Sedation

## 2017-04-03 SURGERY — ATRIAL FIBRILLATION ABLATION
Anesthesia: General

## 2017-04-03 MED ORDER — HEPARIN SODIUM (PORCINE) 1000 UNIT/ML IJ SOLN
INTRAMUSCULAR | Status: DC | PRN
  Administered 2017-04-03: 4000 [IU] via INTRAVENOUS
  Administered 2017-04-03 (×2): 5000 [IU] via INTRAVENOUS

## 2017-04-03 MED ORDER — PROPOFOL 10 MG/ML IV BOLUS
INTRAVENOUS | Status: DC | PRN
  Administered 2017-04-03: 160 mg via INTRAVENOUS
  Administered 2017-04-03: 40 mg via INTRAVENOUS

## 2017-04-03 MED ORDER — SODIUM CHLORIDE 0.9% FLUSH: 3.0000 mL | INTRAVENOUS | Status: DC | PRN

## 2017-04-03 MED ORDER — PROTAMINE SULFATE 10 MG/ML IV SOLN
INTRAVENOUS | Status: DC | PRN
  Administered 2017-04-03: 30 mg via INTRAVENOUS

## 2017-04-03 MED ORDER — HYDROCODONE-ACETAMINOPHEN 5-325 MG PO TABS
1.0000 | ORAL_TABLET | ORAL | Status: DC | PRN
  Administered 2017-04-03: 2 via ORAL
  Administered 2017-04-04 (×2): 1 via ORAL
  Filled 2017-04-03 (×2): qty 1
  Filled 2017-04-03: qty 2

## 2017-04-03 MED ORDER — FENTANYL CITRATE (PF) 100 MCG/2ML IJ SOLN
INTRAMUSCULAR | Status: DC | PRN
  Administered 2017-04-03: 25 ug via INTRAVENOUS
  Administered 2017-04-03: 100 ug via INTRAVENOUS
  Administered 2017-04-03: 25 ug via INTRAVENOUS

## 2017-04-03 MED ORDER — ONDANSETRON HCL 4 MG/2ML IJ SOLN
INTRAMUSCULAR | Status: AC
Start: 2017-04-03 — End: 2017-04-03
  Filled 2017-04-03: qty 2

## 2017-04-03 MED ORDER — BUPIVACAINE HCL (PF) 0.25 % IJ SOLN
INTRAMUSCULAR | Status: AC
  Filled 2017-04-03: qty 30

## 2017-04-03 MED ORDER — PHENYLEPHRINE HCL 10 MG/ML IJ SOLN
INTRAMUSCULAR | Status: DC | PRN
  Administered 2017-04-03: 80 ug via INTRAVENOUS
  Administered 2017-04-03: 40 ug via INTRAVENOUS
  Administered 2017-04-03: 80 ug via INTRAVENOUS
  Administered 2017-04-03: 40 ug via INTRAVENOUS

## 2017-04-03 MED ORDER — SODIUM CHLORIDE 0.9% FLUSH
3.0000 mL | Freq: Two times a day (BID) | INTRAVENOUS | Status: DC
  Administered 2017-04-03 – 2017-04-04 (×2): 3 mL via INTRAVENOUS

## 2017-04-03 MED ORDER — ONDANSETRON HCL 4 MG/2ML IJ SOLN
4.0000 mg | Freq: Four times a day (QID) | INTRAMUSCULAR | Status: DC | PRN
  Administered 2017-04-03: 4 mg via INTRAVENOUS

## 2017-04-03 MED ORDER — SUCCINYLCHOLINE CHLORIDE 20 MG/ML IJ SOLN
INTRAMUSCULAR | Status: DC | PRN
  Administered 2017-04-03: 80 mg via INTRAVENOUS

## 2017-04-03 MED ORDER — PHENOL 1.4 % MT LIQD
1.0000 | OROMUCOSAL | Status: DC | PRN
  Filled 2017-04-03: qty 177

## 2017-04-03 MED ORDER — ONDANSETRON HCL 4 MG/2ML IJ SOLN
INTRAMUSCULAR | Status: DC | PRN
  Administered 2017-04-03: 4 mg via INTRAVENOUS

## 2017-04-03 MED ORDER — DILTIAZEM HCL ER COATED BEADS 120 MG PO CP24
120.0000 mg | ORAL_CAPSULE | Freq: Every day | ORAL | Status: DC
  Administered 2017-04-04: 120 mg via ORAL
  Filled 2017-04-03 (×2): qty 1

## 2017-04-03 MED ORDER — SUGAMMADEX SODIUM 500 MG/5ML IV SOLN
INTRAVENOUS | Status: DC | PRN
  Administered 2017-04-03: 263 mg via INTRAVENOUS

## 2017-04-03 MED ORDER — HEPARIN (PORCINE) IN NACL 2-0.9 UNIT/ML-% IJ SOLN
INTRAMUSCULAR | Status: AC | PRN
  Administered 2017-04-03: 500 mL

## 2017-04-03 MED ORDER — BUPIVACAINE HCL (PF) 0.25 % IJ SOLN
INTRAMUSCULAR | Status: DC | PRN
  Administered 2017-04-03: 20 mL

## 2017-04-03 MED ORDER — ROCURONIUM BROMIDE 100 MG/10ML IV SOLN
INTRAVENOUS | Status: DC | PRN
  Administered 2017-04-03: 10 mg via INTRAVENOUS
  Administered 2017-04-03: 40 mg via INTRAVENOUS

## 2017-04-03 MED ORDER — CARVEDILOL 25 MG PO TABS
25.0000 mg | ORAL_TABLET | Freq: Two times a day (BID) | ORAL | Status: DC
  Administered 2017-04-04: 25 mg via ORAL
  Filled 2017-04-03: qty 1

## 2017-04-03 MED ORDER — MIDAZOLAM HCL 5 MG/5ML IJ SOLN
INTRAMUSCULAR | Status: DC | PRN
  Administered 2017-04-03: 2 mg via INTRAVENOUS

## 2017-04-03 MED ORDER — SODIUM CHLORIDE 0.9 % IV SOLN: 250.0000 mL | INTRAVENOUS | Status: DC | PRN

## 2017-04-03 MED ORDER — IOPAMIDOL (ISOVUE-370) INJECTION 76%
INTRAVENOUS | Status: DC | PRN
  Administered 2017-04-03: 3 mL

## 2017-04-03 MED ORDER — HEPARIN SODIUM (PORCINE) 1000 UNIT/ML IJ SOLN
INTRAMUSCULAR | Status: AC
  Filled 2017-04-03: qty 1

## 2017-04-03 MED ORDER — EPHEDRINE SULFATE 50 MG/ML IJ SOLN
INTRAMUSCULAR | Status: DC | PRN
  Administered 2017-04-03: 5 mg via INTRAVENOUS

## 2017-04-03 MED ORDER — ALPRAZOLAM 0.25 MG PO TABS
0.2500 mg | ORAL_TABLET | Freq: Two times a day (BID) | ORAL | Status: DC | PRN
  Administered 2017-04-04: 0.25 mg via ORAL
  Filled 2017-04-03: qty 1

## 2017-04-03 MED ORDER — IOPAMIDOL (ISOVUE-370) INJECTION 76%
INTRAVENOUS | Status: AC
  Filled 2017-04-03: qty 50

## 2017-04-03 MED ORDER — PHENYLEPHRINE HCL 10 MG/ML IJ SOLN
INTRAVENOUS | Status: DC | PRN
  Administered 2017-04-03: 20 ug/min via INTRAVENOUS

## 2017-04-03 MED ORDER — ACETAMINOPHEN 325 MG PO TABS: 650.0000 mg | ORAL_TABLET | ORAL | Status: DC | PRN

## 2017-04-03 MED ORDER — SODIUM CHLORIDE 0.9 % IV SOLN
INTRAVENOUS | Status: DC
  Administered 2017-04-03: 07:00:00 via INTRAVENOUS

## 2017-04-03 MED ORDER — HEPARIN SODIUM (PORCINE) 1000 UNIT/ML IJ SOLN
INTRAMUSCULAR | Status: DC | PRN
  Administered 2017-04-03: 12000 [IU] via INTRAVENOUS
  Administered 2017-04-03: 1000 [IU] via INTRAVENOUS

## 2017-04-03 MED ORDER — RIVAROXABAN 20 MG PO TABS
20.0000 mg | ORAL_TABLET | Freq: Every day | ORAL | Status: DC
  Administered 2017-04-04: 20 mg via ORAL
  Filled 2017-04-03: qty 1

## 2017-04-03 SURGICAL SUPPLY — 17 items
BAG SNAP BAND KOVER 36X36 (MISCELLANEOUS) ×2 IMPLANT
BLANKET WARM UNDERBOD FULL ACC (MISCELLANEOUS) ×2 IMPLANT
CATH MAPPNG PENTARAY F 2-6-2MM (CATHETERS) ×1 IMPLANT
CATH NAVISTAR SMARTTOUCH DF (ABLATOR) ×2 IMPLANT
CATH SOUNDSTAR ECO REPROCESSED (CATHETERS) ×2 IMPLANT
CATH WEBSTER BI DIR CS D-F CRV (CATHETERS) ×2 IMPLANT
NEEDLE TRANSEP BRK 71CM 407200 (NEEDLE) ×2 IMPLANT
PACK EP LATEX FREE (CUSTOM PROCEDURE TRAY) ×1
PACK EP LF (CUSTOM PROCEDURE TRAY) ×1 IMPLANT
PAD DEFIB LIFELINK (PAD) ×2 IMPLANT
PATCH CARTO3 (PAD) ×2 IMPLANT
PENTARAY F 2-6-2MM (CATHETERS) ×2
SHEATH AVANTI 11F 11CM (SHEATH) ×2 IMPLANT
SHEATH PINNACLE 7F 10CM (SHEATH) ×4 IMPLANT
SHEATH PINNACLE 9F 10CM (SHEATH) ×2 IMPLANT
SHEATH SWARTZ TS SL2 63CM 8.5F (SHEATH) ×2 IMPLANT
TUBING SMART ABLATE COOLFLOW (TUBING) ×2 IMPLANT

## 2017-04-03 NOTE — Transfer of Care (Signed)
Immediate Anesthesia Transfer of Care Note  Patient: Gregory Barrett  Procedure(s) Performed: Procedure(s): Atrial Fibrillation Ablation (N/A)  Patient Location: Cath Lab  Anesthesia Type:General  Level of Consciousness: awake, alert , oriented, patient cooperative and responds to stimulation  Airway & Oxygen Therapy: Patient Spontanous Breathing and Patient connected to face mask oxygen  Post-op Assessment: Report given to RN, Post -op Vital signs reviewed and stable and Patient moving all extremities X 4  Post vital signs: Reviewed and stable  Last Vitals:  Vitals:   04/03/17 0552  BP: (!) 144/106  Pulse: 86  Resp: 18  Temp: 36.8 C    Last Pain:  Vitals:   04/03/17 0552  TempSrc: Oral         Complications: No apparent anesthesia complications

## 2017-04-03 NOTE — Anesthesia Procedure Notes (Addendum)
Procedure Name: Intubation Date/Time: 04/03/2017 7:53 AM Performed by: Tressia Miners LEFFEW Pre-anesthesia Checklist: Patient identified, Patient being monitored, Timeout performed, Emergency Drugs available and Suction available Patient Re-evaluated:Patient Re-evaluated prior to inductionOxygen Delivery Method: Circle System Utilized Preoxygenation: Pre-oxygenation with 100% oxygen Intubation Type: IV induction Ventilation: Two handed mask ventilation required Laryngoscope Size: McGraph and 4 Grade View: Grade II Tube type: Oral Tube size: 7.5 mm Number of attempts: 1 Airway Equipment and Method: Video-laryngoscopy and Stylet Placement Confirmation: ETT inserted through vocal cords under direct vision,  positive ETCO2 and breath sounds checked- equal and bilateral Secured at: 21 cm Tube secured with: Tape Dental Injury: Teeth and Oropharynx as per pre-operative assessment  Difficulty Due To: Difficulty was anticipated, Difficult Airway- due to large tongue and Difficult Airway- due to anterior larynx Future Recommendations: Recommend- induction with short-acting agent, and alternative techniques readily available Comments: Grade 3 view with Mac 4, unable to pass ETT. Switched to [employer] 4, grade 2 view.

## 2017-04-03 NOTE — Progress Notes (Signed)
  Echocardiogram 2D Echocardiogram has been performed.  Gregory Barrett 04/03/2017, 8:58 AM

## 2017-04-03 NOTE — Anesthesia Preprocedure Evaluation (Signed)
Anesthesia Evaluation  Patient identified by MRN, date of birth, ID band Patient awake    Reviewed: Allergy & Precautions, NPO status , Patient's Chart, lab work & pertinent test results, reviewed documented beta blocker date and time   History of Anesthesia Complications Negative for: history of anesthetic complications  Airway Mallampati: III  TM Distance: <3 FB Neck ROM: Full    Dental  (+) Teeth Intact   Pulmonary former smoker,    breath sounds clear to auscultation       Cardiovascular hypertension, Pt. on home beta blockers  Rhythm:Regular     Neuro/Psych  Headaches, negative psych ROS   GI/Hepatic GERD  Medicated and Controlled,  Endo/Other  Morbid obesity  Renal/GU Renal InsufficiencyRenal disease     Musculoskeletal  (+) Arthritis ,   Abdominal   Peds  Hematology   Anesthesia Other Findings   Reproductive/Obstetrics                             Anesthesia Physical Anesthesia Plan  ASA: II  Anesthesia Plan: General   Post-op Pain Management:    Induction: Intravenous  PONV Risk Score and Plan: 2 and Ondansetron, Dexamethasone and Treatment may vary due to age  Airway Management Planned: Oral ETT  Additional Equipment: None  Intra-op Plan:   Post-operative Plan: Extubation in OR  Informed Consent: I have reviewed the patients History and Physical, chart, labs and discussed the procedure including the risks, benefits and alternatives for the proposed anesthesia with the patient or authorized representative who has indicated his/her understanding and acceptance.   Dental advisory given  Plan Discussed with: CRNA and Surgeon  Anesthesia Plan Comments:         Anesthesia Quick Evaluation

## 2017-04-03 NOTE — Interval H&P Note (Signed)
History and Physical Interval Note:  04/03/2017 8:23 AM  Gregory Barrett  has presented today for surgery, with the diagnosis of afib  The various methods of treatment have been discussed with the patient and family. After consideration of risks, benefits and other options for treatment, the patient has consented to  Procedure(s): Atrial Fibrillation Ablation (N/A) as a surgical intervention .  The patient's history has been reviewed, patient examined, no change in status, stable for surgery.  I have reviewed the patient's chart and labs.  Questions were answered to the patient's satisfaction.     [name]

## 2017-04-03 NOTE — Interval H&P Note (Signed)
History and Physical Interval Note:  04/03/2017 7:26 AM  Gregory Barrett  has presented today for surgery, with the diagnosis of afib  The various methods of treatment have been discussed with the patient and family. After consideration of risks, benefits and other options for treatment, the patient has consented to  Procedure(s): Atrial Fibrillation Ablation (N/A) as a surgical intervention .  The patient's history has been reviewed, patient examined, no change in status, stable for surgery.  I have reviewed the patient's chart and labs.  Questions were answered to the patient's satisfaction.   Reports compliance with xarelto without interrruption.  TEE is planned for this morning at the beginning of the procedure.   Thompson Grayer

## 2017-04-03 NOTE — CV Procedure (Signed)
TEE: Pre Ablation  Propofol Anesthesia  Mild LAE No LAA thrombus Mild MR Normal RV Mild aortic root enlargement Likely PFO No aortic debris Mild TR Normal aortic valve  Ok to proceed with ablation  [name], MD

## 2017-04-03 NOTE — Progress Notes (Addendum)
Site area: RFV x 3 Site Prior to Removal:  Level 0 Pressure Applied For:20 min Manual:  yes  Patient Status During Pull:  stable Post Pull Site:  Level 0 Post Pull Instructions Given:  yes Post Pull Pulses Present: palpable Dressing Applied: tegaderm  Bedrest begins @ 1200 till 1800 Comments:

## 2017-04-03 NOTE — Progress Notes (Signed)
PT C/O nausea on arrival. Some improvement post zofran.

## 2017-04-03 NOTE — Discharge Summary (Signed)
ELECTROPHYSIOLOGY PROCEDURE DISCHARGE SUMMARY    Patient ID: Gregory Barrett,  MRN: [id number], DOB/AGE: 44-Jul-1972 44 y.o.  Admit date: 04/03/2017 Discharge date: 04/04/2017  Primary Care Physician: Marletta Lor, MD Electrophysiologist: Thompson Grayer, MD  Primary Discharge Diagnosis:  Persistent atrial fibrillation status post ablation this admission  Secondary Discharge Diagnosis:  1.  HTN 2.  Adrenal tumor 3.  GERD  Procedures This Admission:  1.  Electrophysiology study and radiofrequency catheter ablation on 04/03/17 by Dr Thompson Grayer.  This study demonstrated  atrial fibrillation upon presentation; intracardiac echo reveals a moderate sized left atrium with a large common ostium to the LSPV and LIPV.  The right PVs were noted to have separate ostia.  The interatrial septum was  Aneurysmal.  This was much more evident of ICE than TEE.  + PFO; successful electrical isolation and anatomical encircling of all four pulmonary veins with radiofrequency current.  A WACA approach was used; additional left atrial ablation was performed with a standard box lesion created along the posterior wall of the left atrium; atrial fibrillation successfully cardioverted to sinus rhythm; no early apparent complications.    Brief HPI: Gregory Barrett is a 46 y.o. male with a history of persistent atrial fibrillation.  They have failed medical therapy with Flecainide. Risks, benefits, and alternatives to catheter ablation of atrial fibrillation were reviewed with the patient who wished to proceed.  The patient underwent TEE prior to the procedure which demonstrated normal LV function and no LAA thrombus.    Hospital Course:  The patient was admitted and underwent EPS/RFCA of atrial fibrillation with details as outlined above.  They were monitored on telemetry overnight which demonstrated sinus rhythm.  Groin was without complication on the day of discharge.  The patient was examined and considered to  be stable for discharge.  Wound care and restrictions were reviewed with the patient.  The patient will be seen back by Roderic Palau, NP in 4 weeks and Dr Rayann Heman in 12 weeks for post ablation follow up.   This patients CHA2DS2-VASc Score and unadjusted Ischemic Stroke Rate (% per year) is equal to 0.6 % stroke rate/year from a score of 1 Above score calculated as 1 point each if present [CHF, HTN, DM, Vascular=MI/PAD/Aortic Plaque, Age if 65-74, or Male] Above score calculated as 2 points each if present [Age > 75, or Stroke/TIA/TE]   Physical Exam: Vitals:   04/03/17 1227 04/03/17 1924 04/04/17 0128 04/04/17 0445  BP: 116/74 120/83  120/80  Pulse: 80 80 78 79  Resp: 11 18 11 18   Temp: 98.3 F (36.8 C) 98.6 F (37 C)  98.8 F (37.1 C)  TempSrc: Oral Oral  Oral  SpO2: 91% 98% 98% 93%  Weight: 294 lb (133.4 kg)   290 lb (131.5 kg)  Height: 5\' 11"  (1.803 m)       GEN- The patient is obese appearing, alert and oriented x 3 today.   HEENT: normocephalic, atraumatic; sclera clear, conjunctiva pink; hearing intact; oropharynx clear; neck supple  Lungs- Clear to ausculation bilaterally, normal work of breathing.  No wheezes, rales, rhonchi Heart- Regular rate and rhythm, no murmurs, rubs or gallops  GI- soft, non-tender, non-distended, bowel sounds present  Extremities- no clubbing, cyanosis, or edema; DP/PT/radial pulses 2+ bilaterally, groin without hematoma/bruit MS- no significant deformity or atrophy Skin- warm and dry, no rash or lesion Psych- euthymic mood, full affect Neuro- strength and sensation are intact   Labs:   Lab Results  Component Value Date   WBC 8.6 03/20/2017   HGB 14.3 03/20/2017   HCT 41.7 03/20/2017   MCV 89 03/20/2017   PLT 259 03/20/2017   No results for input(s): NA, K, CL, CO2, BUN, CREATININE, CALCIUM, PROT, BILITOT, ALKPHOS, ALT, AST, GLUCOSE in the last 168 hours.  Invalid input(s): LABALBU   Discharge Medications:  Allergies as of 04/04/2017    No Known Allergies     Medication List    TAKE these medications   ALPRAZolam 0.25 MG tablet Commonly known as:  XANAX TAKE 1 TABLET BY MOUTH TWICE A DAY AS NEEDED FOR ANXIETY   carvedilol 25 MG tablet Commonly known as:  COREG TAKE 1 TABLET (25 MG TOTAL) BY MOUTH 2 (TWO) TIMES DAILY.   diltiazem 30 MG tablet Commonly known as:  CARDIZEM Take 30 mg by mouth See admin instructions. Take 1 tablet by mouth every 4 hours AS NEEDED for heart rate over 100   diltiazem 120 MG 24 hr capsule Commonly known as:  DILACOR XR Take 1 capsule (120 mg total) by mouth daily.   esomeprazole 20 MG capsule Commonly known as:  NEXIUM Take 20 mg by mouth daily before breakfast.   rivaroxaban 20 MG Tabs tablet Commonly known as:  XARELTO Take 1 tablet (20 mg total) by mouth daily with supper. What changed:  when to take this       Disposition:  Discharge Instructions    Diet - low sodium heart healthy    Complete by:  As directed    Increase activity slowly    Complete by:  As directed      Follow-up Information    [facility] Follow up on 05/01/2017.   Specialty:  Cardiology Why:  at 10:30AM Contact information: [address] [id number] [place] [address] [phone]       Thompson Grayer, MD Follow up on 07/07/2017.   Specialty:  Cardiology Why:  at 2:15PM Contact information: [place] [address] [phone]           Duration of Discharge Encounter: Greater than 30 minutes including physician time.  Signed, Chanetta Marshall, NP 04/04/2017 7:52 AM    I have seen, examined the patient, and reviewed the above assessment and plan.  Changes to above are made where necessary.  On exam, RRR.  Routine follow-up.  Co Sign: Thompson Grayer, MD 04/04/2017 10:53 PM

## 2017-04-03 NOTE — H&P (View-Only) (Signed)
Electrophysiology Office Note   Date:  03/19/2017   ID:  Datron, Brakebill [birth year], MRN [id number]  PCP:  Marletta Lor, MD    Primary Electrophysiologist: Thompson Grayer, MD    Chief Complaint  Patient presents with  . Atrial Fibrillation     History of Present Illness: QUASHON JESUS is a 46 y.o. male who presents today for electrophysiology evaluation.   Since his last visit, he continues to have worsening afib.  He has symptoms of SOB and fatigue.  He is not tolerating afib very well.  He is now in afib all of the time.  Recent CT was not revealing of adrenal tumor.  He is scheduled to see Endocrine soon.  Today, he denies symptoms of palpitations, chest pain,  orthopnea, PND, lower extremity edema, claudication, presyncope, syncope, bleeding, or neurologic sequela. The patient is tolerating medications without difficulties and is otherwise without complaint today.    Past Medical History:  Diagnosis Date  . Arthritis    legs, back   . GERD (gastroesophageal reflux disease)   . Hypertension   . Migraine   . Persistent atrial fibrillation [facility])    Past Surgical History:  Procedure Laterality Date  . CARDIOVERSION N/A 09/13/2016   Procedure: CARDIOVERSION;  Surgeon: Fay Records, MD;  Location: [facility] ENDOSCOPY;  Service: Cardiovascular;  Laterality: N/A;  . CARDIOVERSION N/A 10/02/2016   Procedure: CARDIOVERSION;  Surgeon: Larey Dresser, MD;  Location: [place];  Service: Cardiovascular;  Laterality: N/A;  . HARDWARE REMOVAL Right 12/12/2015   Procedure: HARDWARE REMOVAL RIGHT TIBIAL;  Surgeon: Altamese Allport, MD;  Location: [place];  Service: Orthopedics;  Laterality: Right;  . KNEE SURGERY     right  . TIBIA FRACTURE SURGERY  YRS AGO   right  . TOTAL KNEE ARTHROPLASTY Right 03/19/2016   Procedure: RIGHT TOTAL KNEE ARTHROPLASTY;  Surgeon: Paralee Cancel, MD;  Location: [facility];  Service: Orthopedics;  Laterality: Right;  Marland Kitchen VASECTOMY       Current Outpatient  Prescriptions  Medication Sig Dispense Refill  . ALPRAZolam (XANAX) 0.25 MG tablet TAKE 1 TABLET BY MOUTH TWICE A DAY AS NEEDED FOR ANXIETY 30 tablet 0  . carvedilol (COREG) 25 MG tablet TAKE 1 TABLET (25 MG TOTAL) BY MOUTH 2 (TWO) TIMES DAILY. 180 tablet 0  . diltiazem (CARDIZEM) 30 MG tablet Take 1 tablet by mouth every 4 hours AS NEEDED for heart rate over 100    . esomeprazole (NEXIUM) 20 MG capsule Take 20 mg by mouth daily before breakfast.     . flecainide (TAMBOCOR) 100 MG tablet TAKE 1 TABLET (100 MG TOTAL) BY MOUTH 2 (TWO) TIMES DAILY. 180 tablet 0  . rivaroxaban (XARELTO) 20 MG TABS tablet Take 1 tablet (20 mg total) by mouth daily with supper. 90 tablet 3  . spironolactone (ALDACTONE) 25 MG tablet Take 1 tablet by mouth daily.   3   No current facility-administered medications for this visit.     Allergies:   Patient has no known allergies.   Social History:  The patient  reports that he has quit smoking. He has quit using smokeless tobacco. His smokeless tobacco use included Chew. He reports that he drinks alcohol. He reports that he does not use drugs.   Family History:  The patient's  family history includes Cancer in his maternal grandfather; Hypertension in his maternal grandmother.    ROS:  Please see the history of present illness.   All other systems are  personally reviewed and negative.    PHYSICAL EXAM: VS:  BP 124/78   Pulse (!) 112   Ht 5\' 11"  (1.803 m)   Wt 292 lb 6.4 oz (132.6 kg)   SpO2 96%   BMI 40.78 kg/m  , BMI Body mass index is 40.78 kg/m. GEN: Well nourished, well developed, in no acute distress  HEENT: normal  Neck: no JVD, carotid bruits, or masses Cardiac: iRRR; no murmurs, rubs, or gallops,no edema  Respiratory:  clear to auscultation bilaterally, normal work of breathing GI: soft, nontender, nondistended, + BS MS: no deformity or atrophy  Skin: warm and dry  Neuro:  Strength and sensation are intact Psych: euthymic mood, full  affect  EKG:  EKG is ordered today. The ekg ordered today is personally reviewed and shows afib, V rate 112 bpm,    Recent Labs: 08/06/2016: TSH 2.15 10/01/2016: Hemoglobin 16.1; Platelets 398 02/14/2017: BUN 18; Creatinine, Ser 1.24; Magnesium 1.9; Potassium 3.9; Sodium 141  personally reviewed   Lipid Panel     Component Value Date/Time   CHOL 168 07/01/2014 1019   TRIG 77.0 07/01/2014 1019   HDL 38.00 (L) 07/01/2014 1019   CHOLHDL 4 07/01/2014 1019   VLDL 15.4 07/01/2014 1019   LDLCALC 115 (H) 07/01/2014 1019   personally reviewed   Wt Readings from Last 3 Encounters:  03/19/17 292 lb 6.4 oz (132.6 kg)  03/13/17 291 lb 3.2 oz (132.1 kg)  02/19/17 294 lb (133.4 kg)      Other studies personally reviewed: Additional studies/ records that were reviewed today include: AF clinic notes, recent CT  Review of the above records today demonstrates: as above   ASSESSMENT AND PLAN:  1.  Persistent afib The patient has symptomatic persistent afib He has failed medical therapy with flecainide. Therapeutic strategies for afib including medicine (tikosyn, sotalol, amiodarone) and ablation were discussed in detail with the patient today. Risk, benefits, and alternatives to EP study and radiofrequency ablation for afib were also discussed in detail today. These risks include but are not limited to stroke, bleeding, vascular damage, tamponade, perforation, damage to the esophagus, lungs, and other structures, pulmonary vein stenosis, worsening renal function, and death. The patient understands these risk and wishes to proceed.  We will therefore proceed with catheter ablation at the next available time.  Will plan TEE prior to ablation.  2. HTN Plans to see endocrine are noted No changes today  3. Obesity Body mass index is 40.78 kg/m. Weight loss is advised   Signed, Thompson Grayer, MD  03/19/2017 4:51 PM     [place]  [address] [phone] (office) [phone] (fax)

## 2017-04-04 DIAGNOSIS — G43909 Migraine, unspecified, not intractable, without status migrainosus: Secondary | ICD-10-CM | POA: Diagnosis not present

## 2017-04-04 DIAGNOSIS — K219 Gastro-esophageal reflux disease without esophagitis: Secondary | ICD-10-CM | POA: Diagnosis not present

## 2017-04-04 DIAGNOSIS — Z6841 Body Mass Index (BMI) 40.0 and over, adult: Secondary | ICD-10-CM | POA: Diagnosis not present

## 2017-04-04 DIAGNOSIS — I10 Essential (primary) hypertension: Secondary | ICD-10-CM | POA: Diagnosis not present

## 2017-04-04 DIAGNOSIS — M1990 Unspecified osteoarthritis, unspecified site: Secondary | ICD-10-CM | POA: Diagnosis not present

## 2017-04-04 DIAGNOSIS — Z7901 Long term (current) use of anticoagulants: Secondary | ICD-10-CM | POA: Diagnosis not present

## 2017-04-04 DIAGNOSIS — Q211 Atrial septal defect: Secondary | ICD-10-CM | POA: Diagnosis not present

## 2017-04-04 DIAGNOSIS — I481 Persistent atrial fibrillation: Secondary | ICD-10-CM

## 2017-04-04 DIAGNOSIS — Z720 Tobacco use: Secondary | ICD-10-CM | POA: Diagnosis not present

## 2017-04-04 NOTE — Anesthesia Postprocedure Evaluation (Signed)
Anesthesia Post Note  Patient: Gregory Barrett  Procedure(s) Performed: Procedure(s) (LRB): Atrial Fibrillation Ablation (N/A)     Patient location during evaluation: Cath Lab Anesthesia Type: General Level of consciousness: awake and alert Pain management: pain level controlled Vital Signs Assessment: post-procedure vital signs reviewed and stable Respiratory status: spontaneous breathing, nonlabored ventilation, respiratory function stable and patient connected to nasal cannula oxygen Cardiovascular status: blood pressure returned to baseline and stable Postop Assessment: no signs of nausea or vomiting Anesthetic complications: no    Last Vitals:  Vitals:   04/04/17 0445 04/04/17 0807  BP: 120/80 123/82  Pulse: 79 73  Resp: 18 11  Temp: 37.1 C 36.8 C    Last Pain:  Vitals:   04/04/17 0807  TempSrc: Oral  PainSc:                  [name]

## 2017-04-07 ENCOUNTER — Encounter (HOSPITAL_COMMUNITY): Payer: Self-pay | Admitting: Nurse Practitioner

## 2017-04-07 DIAGNOSIS — M25562 Pain in left knee: Secondary | ICD-10-CM | POA: Diagnosis not present

## 2017-04-07 DIAGNOSIS — M129 Arthropathy, unspecified: Secondary | ICD-10-CM | POA: Diagnosis not present

## 2017-04-08 ENCOUNTER — Ambulatory Visit (HOSPITAL_COMMUNITY)
Admission: RE | Admit: 2017-04-08 | Discharge: 2017-04-08 | Disposition: A | Discharge status: Home / Self Care | Payer: BLUE CROSS/BLUE SHIELD | Source: Ambulatory Visit | Attending: Nurse Practitioner | Admitting: Nurse Practitioner

## 2017-04-08 ENCOUNTER — Other Ambulatory Visit: Payer: BLUE CROSS/BLUE SHIELD

## 2017-04-08 ENCOUNTER — Encounter (HOSPITAL_COMMUNITY): Payer: Self-pay | Admitting: Nurse Practitioner

## 2017-04-08 VITALS — BP 120/92 | HR 92 | Ht 71.0 in | Wt 293.2 lb

## 2017-04-08 DIAGNOSIS — K219 Gastro-esophageal reflux disease without esophagitis: Secondary | ICD-10-CM | POA: Insufficient documentation

## 2017-04-08 DIAGNOSIS — I4819 Other persistent atrial fibrillation: Secondary | ICD-10-CM

## 2017-04-08 DIAGNOSIS — Z85820 Personal history of malignant melanoma of skin: Secondary | ICD-10-CM | POA: Insufficient documentation

## 2017-04-08 DIAGNOSIS — I10 Essential (primary) hypertension: Secondary | ICD-10-CM | POA: Insufficient documentation

## 2017-04-08 DIAGNOSIS — Z7901 Long term (current) use of anticoagulants: Secondary | ICD-10-CM | POA: Insufficient documentation

## 2017-04-08 DIAGNOSIS — I481 Persistent atrial fibrillation: Secondary | ICD-10-CM

## 2017-04-08 DIAGNOSIS — Z96651 Presence of right artificial knee joint: Secondary | ICD-10-CM | POA: Diagnosis not present

## 2017-04-08 DIAGNOSIS — I480 Paroxysmal atrial fibrillation: Secondary | ICD-10-CM | POA: Diagnosis not present

## 2017-04-08 DIAGNOSIS — Z9889 Other specified postprocedural states: Secondary | ICD-10-CM | POA: Insufficient documentation

## 2017-04-08 DIAGNOSIS — Z87891 Personal history of nicotine dependence: Secondary | ICD-10-CM | POA: Insufficient documentation

## 2017-04-08 LAB — BASIC METABOLIC PANEL
ANION GAP: 7 (ref 5–15)
BUN: 23 mg/dL — ABNORMAL HIGH (ref 6–20)
CALCIUM: 8.8 mg/dL — AB (ref 8.9–10.3)
CHLORIDE: 107 mmol/L (ref 101–111)
CO2: 25 mmol/L (ref 22–32)
Creatinine, Ser: 1.23 mg/dL (ref 0.61–1.24)
GFR calc Af Amer: >60 mL/min (ref >60)
GFR calc non Af Amer: >60 mL/min (ref >60)
GLUCOSE: 96 mg/dL (ref 65–99)
Potassium: 3.6 mmol/L (ref 3.5–5.1)
Sodium: 139 mmol/L (ref 135–145)

## 2017-04-08 LAB — CBC
HEMATOCRIT: 42.2 % (ref 39.0–52.0)
Hemoglobin: 14.9 g/dL (ref 13.0–17.0)
MCH: 31.4 pg (ref 26.0–34.0)
MCHC: 35.3 g/dL (ref 30.0–36.0)
MCV: 88.8 fL (ref 78.0–100.0)
Platelets: 261 10*3/uL (ref 150–400)
RBC: 4.75 MIL/uL (ref 4.22–5.81)
RDW: 12.5 % (ref 11.5–15.5)
WBC: 7.9 10*3/uL (ref 4.0–10.5)

## 2017-04-08 NOTE — Progress Notes (Signed)
Primary Care Physician: Marletta Lor, MD Referring Physician: Dr. Levan Hurst is a 46 y.o. male with a h/o paroxysmal afib on flecainide. He has recently gone thru evalution for possible primary hyperaldosteronism. Dr. Rayann Heman had seen pt in the past and offered to ablate but wanted this evaluated first. He has been given the go ahead by his PCP for ablation. Pt is ready to have this done as he is having days where the afib is making him lightheaded and fatigued.He continues on flecainide but he feels that he is in afib most of the time now.  F/u afib clinic 6/12. He had ablation on 6/15. He went back into afib Saturday. He has not missed any anticoagulation doses. No swallowing or groin issues.  Today, he denies symptoms of  chest pain, orthopnea, PND, lower extremity edema, dizziness, presyncope, syncope, or neurologic sequela.  The patient is tolerating medications without difficulties and is otherwise without complaint today.   Past Medical History:  Diagnosis Date  . Arthritis    legs, back   . GERD (gastroesophageal reflux disease)   . Hyperaldosteronism ([place])   . Hypertension   . Melanoma of lower back ([place]) 2000s  . Migraine   . Persistent atrial fibrillation ([place])   . Pneumonia    "a few times; last time ~ 2010" (04/03/2017)   Past Surgical History:  Procedure Laterality Date  . ATRIAL FIBRILLATION ABLATION N/A 04/03/2017   Procedure: Atrial Fibrillation Ablation;  Surgeon: Thompson Grayer, MD;  Location: [place] CV LAB;  Service: Cardiovascular;  Laterality: N/A;  . CARDIOVERSION N/A 09/13/2016   Procedure: CARDIOVERSION;  Surgeon: Fay Records, MD;  Location: [facility] ENDOSCOPY;  Service: Cardiovascular;  Laterality: N/A;  . CARDIOVERSION N/A 10/02/2016   Procedure: CARDIOVERSION;  Surgeon: Larey Dresser, MD;  Location: [place];  Service: Cardiovascular;  Laterality: N/A;  . HARDWARE REMOVAL Right 12/12/2015   Procedure: HARDWARE REMOVAL RIGHT TIBIAL;   Surgeon: Altamese [place], MD;  Location: [place];  Service: Orthopedics;  Laterality: Right;  . JOINT REPLACEMENT    . MELANOMA EXCISION     "back"  . ORIF NONUNION / MALUNION TIBIAL FRACTURE Right 2005   "put plates & screws in"  . TOTAL KNEE ARTHROPLASTY Right 03/19/2016   Procedure: RIGHT TOTAL KNEE ARTHROPLASTY;  Surgeon: Paralee Cancel, MD;  Location: [facility];  Service: Orthopedics;  Laterality: Right;  Marland Kitchen VASECTOMY      Current Outpatient Prescriptions  Medication Sig Dispense Refill  . ALPRAZolam (XANAX) 0.25 MG tablet TAKE 1 TABLET BY MOUTH TWICE A DAY AS NEEDED FOR ANXIETY 30 tablet 0  . carvedilol (COREG) 25 MG tablet TAKE 1 TABLET (25 MG TOTAL) BY MOUTH 2 (TWO) TIMES DAILY. 180 tablet 0  . diltiazem (CARDIZEM) 30 MG tablet Take 30 mg by mouth See admin instructions. Take 1 tablet by mouth every 4 hours AS NEEDED for heart rate over 100     . diltiazem (DILACOR XR) 120 MG 24 hr capsule Take 1 capsule (120 mg total) by mouth daily. 30 capsule 3  . esomeprazole (NEXIUM) 20 MG capsule Take 20 mg by mouth daily before breakfast.     . naproxen (NAPROSYN) 500 MG tablet Take 500 mg by mouth 2 (two) times daily with a meal.    . rivaroxaban (XARELTO) 20 MG TABS tablet Take 1 tablet (20 mg total) by mouth daily with supper. (Patient taking differently: Take 20 mg by mouth daily. ) 90 tablet 3  No current facility-administered medications for this encounter.     No Known Allergies  Social History   Social History  . Marital status: Married    Spouse name: N/A  . Number of children: N/A  . Years of education: N/A   Occupational History  . Not on file.   Social History Main Topics  . Smoking status: Former Smoker    Packs/day: 0.50    Years: 2.00    Types: Cigarettes    Quit date: 09/20/2010  . Smokeless tobacco: Former Systems developer    Types: Chew    Quit date: 11/03/2014  . Alcohol use 1.8 oz/week    3 Glasses of wine per week  . Drug use: No  . Sexual activity: Yes   Other Topics  Concern  . Not on file   Social History Narrative  . No narrative on file    Family History  Problem Relation Age of Onset  . Hypertension Maternal Grandmother   . Cancer Maternal Grandfather        lung ca  . Adrenal disorder Neg Hx     ROS- All systems are reviewed and negative except as per the HPI above  Physical Exam: Vitals:   04/08/17 1508  BP: (!) 120/92  Pulse: 92  Weight: 293 lb 3.2 oz (133 kg)  Height: 5\' 11"  (1.803 m)   Wt Readings from Last 3 Encounters:  04/08/17 293 lb 3.2 oz (133 kg)  04/04/17 290 lb (131.5 kg)  04/02/17 290 lb (131.5 kg)    Labs: Lab Results  Component Value Date   NA 139 04/08/2017   K 3.6 04/08/2017   CL 107 04/08/2017   CO2 25 04/08/2017   GLUCOSE 96 04/08/2017   BUN 23 (H) 04/08/2017   CREATININE 1.23 04/08/2017   CALCIUM 8.8 (L) 04/08/2017   MG 1.9 02/14/2017   Lab Results  Component Value Date   INR 1.3 (H) 10/01/2016   Lab Results  Component Value Date   CHOL 168 07/01/2014   HDL 38.00 (L) 07/01/2014   LDLCALC 115 (H) 07/01/2014   TRIG 77.0 07/01/2014     GEN- The patient is well appearing, alert and oriented x 3 today.   Head- normocephalic, atraumatic Eyes-  Sclera clear, conjunctiva pink Ears- hearing intact Oropharynx- clear Neck- supple, no JVP Lymph- no cervical lymphadenopathy Lungs- Clear to ausculation bilaterally, normal work of breathing Heart- irregular rate and rhythm, no murmurs, rubs or gallops, PMI not laterally displaced GI- soft, NT, ND, + BS Extremities- no clubbing, cyanosis, or edema MS- no significant deformity or atrophy Skin- no rash or lesion Psych- euthymic mood, full affect Neuro- strength and sensation are intact  EKG-afib with RVR at 92 ms, qrs int 92 ms, qtc 455 ms Epic records reviewed    Assessment and Plan: 1. Persistent  afib s/p ablation Will schedule for cardioversion this Thursday No missed doses of anticoagulation Continue  cardizem and carvedilol Pt would  like to return to work, note given to  return on Monday  2. HTN Stable  F/u here in one week after cardioversion  Butch Penny C. Jani Ploeger, [facility] [address] [place], [place] [address] [phone]

## 2017-04-08 NOTE — Patient Instructions (Signed)
Cardioversion scheduled for Thursday, June 14th  - Arrive at the [facility] and go to admitting at 12:30PM  -Do not eat or drink anything after midnight the night prior to your procedure.  - Take all your medication with a sip of water prior to arrival.  - You will not be able to drive home after your procedure.

## 2017-04-10 ENCOUNTER — Ambulatory Visit (HOSPITAL_COMMUNITY): Payer: BLUE CROSS/BLUE SHIELD | Admitting: Anesthesiology

## 2017-04-10 ENCOUNTER — Ambulatory Visit (HOSPITAL_COMMUNITY)
Admission: RE | Admit: 2017-04-10 | Discharge: 2017-04-10 | Disposition: A | Discharge status: Home / Self Care | Payer: BLUE CROSS/BLUE SHIELD | Source: Ambulatory Visit | Attending: Internal Medicine | Admitting: Internal Medicine

## 2017-04-10 ENCOUNTER — Encounter (HOSPITAL_COMMUNITY): Payer: Self-pay

## 2017-04-10 ENCOUNTER — Encounter (HOSPITAL_COMMUNITY)
Admission: RE | Disposition: A | Discharge status: Home / Self Care | Payer: Self-pay | Source: Ambulatory Visit | Attending: Internal Medicine

## 2017-04-10 DIAGNOSIS — Z79899 Other long term (current) drug therapy: Secondary | ICD-10-CM | POA: Diagnosis not present

## 2017-04-10 DIAGNOSIS — Z87891 Personal history of nicotine dependence: Secondary | ICD-10-CM | POA: Diagnosis not present

## 2017-04-10 DIAGNOSIS — Z791 Long term (current) use of non-steroidal anti-inflammatories (NSAID): Secondary | ICD-10-CM | POA: Insufficient documentation

## 2017-04-10 DIAGNOSIS — Z85820 Personal history of malignant melanoma of skin: Secondary | ICD-10-CM | POA: Diagnosis not present

## 2017-04-10 DIAGNOSIS — Z96651 Presence of right artificial knee joint: Secondary | ICD-10-CM | POA: Diagnosis not present

## 2017-04-10 DIAGNOSIS — I10 Essential (primary) hypertension: Secondary | ICD-10-CM | POA: Insufficient documentation

## 2017-04-10 DIAGNOSIS — Z7901 Long term (current) use of anticoagulants: Secondary | ICD-10-CM | POA: Diagnosis not present

## 2017-04-10 DIAGNOSIS — K219 Gastro-esophageal reflux disease without esophagitis: Secondary | ICD-10-CM | POA: Diagnosis not present

## 2017-04-10 DIAGNOSIS — I480 Paroxysmal atrial fibrillation: Secondary | ICD-10-CM | POA: Diagnosis not present

## 2017-04-10 DIAGNOSIS — I481 Persistent atrial fibrillation: Secondary | ICD-10-CM | POA: Diagnosis not present

## 2017-04-10 DIAGNOSIS — I4891 Unspecified atrial fibrillation: Secondary | ICD-10-CM

## 2017-04-10 DIAGNOSIS — M25561 Pain in right knee: Secondary | ICD-10-CM | POA: Diagnosis not present

## 2017-04-10 HISTORY — PX: CARDIOVERSION: SHX1299

## 2017-04-10 SURGERY — CARDIOVERSION
Anesthesia: General

## 2017-04-10 MED ORDER — FENTANYL CITRATE (PF) 100 MCG/2ML IJ SOLN: 25.0000 ug | INTRAMUSCULAR | Status: DC | PRN

## 2017-04-10 MED ORDER — PROPOFOL 10 MG/ML IV BOLUS
INTRAVENOUS | Status: DC | PRN
  Administered 2017-04-10: 40 mg via INTRAVENOUS
  Administered 2017-04-10: 20 mg via INTRAVENOUS
  Administered 2017-04-10: 170 mg via INTRAVENOUS

## 2017-04-10 MED ORDER — ONDANSETRON HCL 4 MG/2ML IJ SOLN: 4.0000 mg | Freq: Once | INTRAMUSCULAR | Status: DC | PRN

## 2017-04-10 MED ORDER — OXYCODONE HCL 5 MG PO TABS: 5.0000 mg | ORAL_TABLET | Freq: Once | ORAL | Status: DC | PRN

## 2017-04-10 MED ORDER — LIDOCAINE 2% (20 MG/ML) 5 ML SYRINGE
INTRAMUSCULAR | Status: DC | PRN
  Administered 2017-04-10: 40 mg via INTRAVENOUS

## 2017-04-10 MED ORDER — OXYCODONE HCL 5 MG/5ML PO SOLN: 5.0000 mg | Freq: Once | ORAL | Status: DC | PRN

## 2017-04-10 MED ORDER — SODIUM CHLORIDE 0.9 % IV SOLN
INTRAVENOUS | Status: DC
Start: 2017-04-10 — End: 2017-04-10
  Administered 2017-04-10: 13:00:00 via INTRAVENOUS

## 2017-04-10 NOTE — Op Note (Signed)
Patient anesthetized with propofol and lidocaine Cardioversion attempted with pads in AP position with 200 J syncrhonized biphasic energy  Not successful.   New pads placed in apex base position  Again, 200J  Synchronized biphasic energy applied  Not successful. New pads again placed in AP position WHile chest compression applied 200 J synchronized biphasic energy was not successful at cardioversion.

## 2017-04-10 NOTE — Transfer of Care (Signed)
Immediate Anesthesia Transfer of Care Note  Patient: Gregory Barrett  Procedure(s) Performed: Procedure(s): CARDIOVERSION (N/A)  Patient Location: Endoscopy Unit  Anesthesia Type:General  Level of Consciousness: awake, alert  and oriented  Airway & Oxygen Therapy: Patient Spontanous Breathing and Patient connected to nasal cannula oxygen  Post-op Assessment: Report given to RN, Post -op Vital signs reviewed and stable and Patient moving all extremities X 4  Post vital signs: Reviewed and stable  Last Vitals:  Vitals:   04/10/17 1410 04/10/17 1411  BP:    Pulse: 93 83  Resp: 14 12  Temp:      Last Pain:  Vitals:   04/10/17 1301  TempSrc: Oral         Complications: No apparent anesthesia complications

## 2017-04-10 NOTE — Interval H&P Note (Signed)
History and Physical Interval Note:  04/10/2017 1:47 PM  Gregory Barrett  has presented today for surgery, with the diagnosis of AFIB  The various methods of treatment have been discussed with the patient and family. After consideration of risks, benefits and other options for treatment, the patient has consented to  Procedure(s): CARDIOVERSION (N/A) as a surgical intervention .  The patient's history has been reviewed, patient examined, no change in status, stable for surgery.  I have reviewed the patient's chart and labs.  Questions were answered to the patient's satisfaction.     Dorris Carnes

## 2017-04-10 NOTE — Discharge Instructions (Signed)
Electrical Cardioversion, Care After °This sheet gives you information about how to care for yourself after your procedure. Your health care provider may also give you more specific instructions. If you have problems or questions, contact your health care provider. °What can I expect after the procedure? °After the procedure, it is common to have: °· Some redness on the skin where the shocks were given. ° °Follow these instructions at home: °· Do not drive for 24 hours if you were given a medicine to help you relax (sedative). °· Take over-the-counter and prescription medicines only as told by your health care provider. °· Ask your health care provider how to check your pulse. Check it often. °· Rest for 48 hours after the procedure or as told by your health care provider. °· Avoid or limit your caffeine use as told by your health care provider. °Contact a health care provider if: °· You feel like your heart is beating too quickly or your pulse is not regular. °· You have a serious muscle cramp that does not go away. °Get help right away if: °· You have discomfort in your chest. °· You are dizzy or you feel faint. °· You have trouble breathing or you are short of breath. °· Your speech is slurred. °· You have trouble moving an arm or leg on one side of your body. °· Your fingers or toes turn cold or blue. °This information is not intended to replace advice given to you by your health care provider. Make sure you discuss any questions you have with your health care provider. °Document Released: 08/04/2013 Document Revised: 05/17/2016 Document Reviewed: 04/19/2016 °Elsevier Interactive Patient Education © 2018 Elsevier Inc. ° °

## 2017-04-10 NOTE — H&P (View-Only) (Signed)
Primary Care Physician: Marletta Lor, MD Referring Physician: Dr. Levan Hurst is a 46 y.o. male with a h/o paroxysmal afib on flecainide. He has recently gone thru evalution for possible primary hyperaldosteronism. Dr. Rayann Heman had seen pt in the past and offered to ablate but wanted this evaluated first. He has been given the go ahead by his PCP for ablation. Pt is ready to have this done as he is having days where the afib is making him lightheaded and fatigued.He continues on flecainide but he feels that he is in afib most of the time now.  F/u afib clinic 6/12. He had ablation on 6/15. He went back into afib Saturday. He has not missed any anticoagulation doses. No swallowing or groin issues.  Today, he denies symptoms of  chest pain, orthopnea, PND, lower extremity edema, dizziness, presyncope, syncope, or neurologic sequela.  The patient is tolerating medications without difficulties and is otherwise without complaint today.   Past Medical History:  Diagnosis Date  . Arthritis    legs, back   . GERD (gastroesophageal reflux disease)   . Hyperaldosteronism ([place])   . Hypertension   . Melanoma of lower back ([place]) 2000s  . Migraine   . Persistent atrial fibrillation ([place])   . Pneumonia    "a few times; last time ~ 2010" (04/03/2017)   Past Surgical History:  Procedure Laterality Date  . ATRIAL FIBRILLATION ABLATION N/A 04/03/2017   Procedure: Atrial Fibrillation Ablation;  Surgeon: Thompson Grayer, MD;  Location: [facility];  Service: Cardiovascular;  Laterality: N/A;  . CARDIOVERSION N/A 09/13/2016   Procedure: CARDIOVERSION;  Surgeon: Fay Records, MD;  Location: [facility];  Service: Cardiovascular;  Laterality: N/A;  . CARDIOVERSION N/A 10/02/2016   Procedure: CARDIOVERSION;  Surgeon: Larey Dresser, MD;  Location: [facility];  Service: Cardiovascular;  Laterality: N/A;  . HARDWARE REMOVAL Right 12/12/2015   Procedure: HARDWARE REMOVAL RIGHT TIBIAL;   Surgeon: Altamese Prudenville, MD;  Location: [facility];  Service: Orthopedics;  Laterality: Right;  . JOINT REPLACEMENT    . MELANOMA EXCISION     "back"  . ORIF NONUNION / MALUNION TIBIAL FRACTURE Right 2005   "put plates & screws in"  . TOTAL KNEE ARTHROPLASTY Right 03/19/2016   Procedure: RIGHT TOTAL KNEE ARTHROPLASTY;  Surgeon: Paralee Cancel, MD;  Location: [facility];  Service: Orthopedics;  Laterality: Right;  Marland Kitchen VASECTOMY      Current Outpatient Prescriptions  Medication Sig Dispense Refill  . ALPRAZolam (XANAX) 0.25 MG tablet TAKE 1 TABLET BY MOUTH TWICE A DAY AS NEEDED FOR ANXIETY 30 tablet 0  . carvedilol (COREG) 25 MG tablet TAKE 1 TABLET (25 MG TOTAL) BY MOUTH 2 (TWO) TIMES DAILY. 180 tablet 0  . diltiazem (CARDIZEM) 30 MG tablet Take 30 mg by mouth See admin instructions. Take 1 tablet by mouth every 4 hours AS NEEDED for heart rate over 100     . diltiazem (DILACOR XR) 120 MG 24 hr capsule Take 1 capsule (120 mg total) by mouth daily. 30 capsule 3  . esomeprazole (NEXIUM) 20 MG capsule Take 20 mg by mouth daily before breakfast.     . naproxen (NAPROSYN) 500 MG tablet Take 500 mg by mouth 2 (two) times daily with a meal.    . rivaroxaban (XARELTO) 20 MG TABS tablet Take 1 tablet (20 mg total) by mouth daily with supper. (Patient taking differently: Take 20 mg by mouth daily. ) 90 tablet 3  No current facility-administered medications for this encounter.     No Known Allergies  Social History   Social History  . Marital status: Married    Spouse name: N/A  . Number of children: N/A  . Years of education: N/A   Occupational History  . Not on file.   Social History Main Topics  . Smoking status: Former Smoker    Packs/day: 0.50    Years: 2.00    Types: Cigarettes    Quit date: 09/20/2010  . Smokeless tobacco: Former Systems developer    Types: Chew    Quit date: 11/03/2014  . Alcohol use 1.8 oz/week    3 Glasses of wine per week  . Drug use: No  . Sexual activity: Yes   Other Topics  Concern  . Not on file   Social History Narrative  . No narrative on file    Family History  Problem Relation Age of Onset  . Hypertension Maternal Grandmother   . Cancer Maternal Grandfather        lung ca  . Adrenal disorder Neg Hx     ROS- All systems are reviewed and negative except as per the HPI above  Physical Exam: Vitals:   04/08/17 1508  BP: (!) 120/92  Pulse: 92  Weight: 293 lb 3.2 oz (133 kg)  Height: 5\' 11"  (1.803 m)   Wt Readings from Last 3 Encounters:  04/08/17 293 lb 3.2 oz (133 kg)  04/04/17 290 lb (131.5 kg)  04/02/17 290 lb (131.5 kg)    Labs: Lab Results  Component Value Date   NA 139 04/08/2017   K 3.6 04/08/2017   CL 107 04/08/2017   CO2 25 04/08/2017   GLUCOSE 96 04/08/2017   BUN 23 (H) 04/08/2017   CREATININE 1.23 04/08/2017   CALCIUM 8.8 (L) 04/08/2017   MG 1.9 02/14/2017   Lab Results  Component Value Date   INR 1.3 (H) 10/01/2016   Lab Results  Component Value Date   CHOL 168 07/01/2014   HDL 38.00 (L) 07/01/2014   LDLCALC 115 (H) 07/01/2014   TRIG 77.0 07/01/2014     GEN- The patient is well appearing, alert and oriented x 3 today.   Head- normocephalic, atraumatic Eyes-  Sclera clear, conjunctiva pink Ears- hearing intact Oropharynx- clear Neck- supple, no JVP Lymph- no cervical lymphadenopathy Lungs- Clear to ausculation bilaterally, normal work of breathing Heart- irregular rate and rhythm, no murmurs, rubs or gallops, PMI not laterally displaced GI- soft, NT, ND, + BS Extremities- no clubbing, cyanosis, or edema MS- no significant deformity or atrophy Skin- no rash or lesion Psych- euthymic mood, full affect Neuro- strength and sensation are intact  EKG-afib with RVR at 92 ms, qrs int 92 ms, qtc 455 ms Epic records reviewed    Assessment and Plan: 1. Persistent  afib s/p ablation Will schedule for cardioversion this Thursday No missed doses of anticoagulation Continue  cardizem and carvedilol Pt would  like to return to work, note given to  return on Monday  2. HTN Stable  F/u here in one week after cardioversion  Butch Penny C. Shayne Deerman, [facility] [address] [place], [place] [address] [phone]

## 2017-04-10 NOTE — Anesthesia Preprocedure Evaluation (Addendum)
Anesthesia Evaluation  Patient identified by MRN, date of birth, ID band Patient awake    Reviewed: Allergy & Precautions, NPO status , Patient's Chart, lab work & pertinent test results  Airway Mallampati: II  TM Distance: >3 FB Neck ROM: Full    Dental  (+) Teeth Intact   Pulmonary former smoker,    breath sounds clear to auscultation       Cardiovascular hypertension,  Rhythm:Regular Rate:Normal     Neuro/Psych    GI/Hepatic   Endo/Other    Renal/GU      Musculoskeletal   Abdominal   Peds  Hematology   Anesthesia Other Findings   Reproductive/Obstetrics                            Anesthesia Physical Anesthesia Plan  ASA: III  Anesthesia Plan: General   Post-op Pain Management:    Induction: Intravenous  PONV Risk Score and Plan: Propofol  Airway Management Planned: Mask  Additional Equipment:   Intra-op Plan:   Post-operative Plan:   Informed Consent: I have reviewed the patients History and Physical, chart, labs and discussed the procedure including the risks, benefits and alternatives for the proposed anesthesia with the patient or authorized representative who has indicated his/her understanding and acceptance.   Dental advisory given  Plan Discussed with: CRNA and Anesthesiologist  Anesthesia Plan Comments:        Anesthesia Quick Evaluation

## 2017-04-11 ENCOUNTER — Telehealth (HOSPITAL_COMMUNITY): Payer: Self-pay | Admitting: *Deleted

## 2017-04-11 LAB — CATECHOLAMINES, FRACTIONATED, URINE, 24 HOUR
CALCULATED TOTAL (E+ NE): 64 ug/(24.h) (ref 26–121)
Creatinine, Urine mg/day-CATEUR: 2.68 g/(24.h) — ABNORMAL HIGH (ref 0.63–2.50)
Dopamine, 24 hr Urine: 343 mcg/24 h (ref 52–480)
EPINEPHRINE, 24 HR URINE: 7 ug/(24.h) (ref 2–24)
NOREPINEPHRINE, 24 HR UR: 57 ug/(24.h) (ref 15–100)
TOTAL VOLUME - CF 24HR U: 1200 mL

## 2017-04-11 LAB — METANEPHRINES, URINE, 24 HOUR
Metaneph Total, Ur: 505 mcg/24 h (ref 182–739)
Metanephrines, Ur: 113 mcg/24 h (ref 58–203)
NORMETANEPHRINE 24H UR: 392 ug/(24.h) (ref 88–649)

## 2017-04-11 MED ORDER — FLECAINIDE ACETATE 100 MG PO TABS: 100.0000 mg | ORAL_TABLET | Freq: Two times a day (BID) | ORAL | Status: DC

## 2017-04-11 NOTE — Telephone Encounter (Signed)
Patient failed cardioversion post ablation. Discussed with Dr Rayann Heman -- recommends restarting flecainide 100mg  twice a day and repeat EKG on Wednesday. Pt agreeable. Can continue PRN cardizem as needed for rate control.

## 2017-04-12 ENCOUNTER — Encounter (HOSPITAL_COMMUNITY): Payer: Self-pay | Admitting: Internal Medicine

## 2017-04-14 ENCOUNTER — Encounter: Payer: Self-pay | Admitting: Internal Medicine

## 2017-04-14 ENCOUNTER — Ambulatory Visit (INDEPENDENT_AMBULATORY_CARE_PROVIDER_SITE_OTHER): Payer: BLUE CROSS/BLUE SHIELD | Admitting: Internal Medicine

## 2017-04-14 VITALS — BP 120/72 | HR 95 | Temp 98.7°F | Ht 71.0 in | Wt 295.8 lb

## 2017-04-14 DIAGNOSIS — E269 Hyperaldosteronism, unspecified: Secondary | ICD-10-CM | POA: Diagnosis not present

## 2017-04-14 DIAGNOSIS — M1000 Idiopathic gout, unspecified site: Secondary | ICD-10-CM | POA: Diagnosis not present

## 2017-04-14 DIAGNOSIS — M25562 Pain in left knee: Secondary | ICD-10-CM

## 2017-04-14 DIAGNOSIS — I481 Persistent atrial fibrillation: Secondary | ICD-10-CM

## 2017-04-14 DIAGNOSIS — I4819 Other persistent atrial fibrillation: Secondary | ICD-10-CM

## 2017-04-14 MED ORDER — PREDNISONE 20 MG PO TABS: 20.0000 mg | ORAL_TABLET | Freq: Two times a day (BID) | ORAL | 2 refills | Status: DC

## 2017-04-14 NOTE — Patient Instructions (Addendum)
WE NOW OFFER   [facility] FAST TRACK!!!  SAME DAY Appointments for ACUTE CARE  Such as: Sprains, Injuries, cuts, abrasions, rashes, muscle pain, joint pain, back pain Colds, flu, sore throats, headache, allergies, cough, fever  Ear pain, sinus and eye infections Abdominal pain, nausea, vomiting, diarrhea, upset stomach Animal/insect bites  3 Easy Ways to Schedule: Walk-In Scheduling Call in scheduling Mychart Sign-up: [email or web address]   Prednisone 20 mg twice daily  Return at your convenience for an early morning lab draw to check aldosterone and renin levels  Limit your sodium (Salt) intake  Please check your blood pressure on a regular basis.  If it is consistently greater than 150/90, please make an office appointment.        Gout Gout is painful swelling that can happen in some of your joints. Gout is a type of arthritis. This condition is caused by having too much uric acid in your body. Uric acid is a chemical that is made when your body breaks down substances called purines. If your body has too much uric acid, sharp crystals can form and build up in your joints. This causes pain and swelling. Gout attacks can happen quickly and be very painful (acute gout). Over time, the attacks can affect more joints and happen more often (chronic gout). Follow these instructions at home: During a Gout Attack  If directed, put ice on the painful area: ? Put ice in a plastic bag. ? Place a towel between your skin and the bag. ? Leave the ice on for 20 minutes, 2-3 times a day.  Rest the joint as much as possible. If the joint is in your leg, you may be given crutches to use.  Raise (elevate) the painful joint above the level of your heart as often as you can.  Drink enough fluids to keep your pee (urine) clear or pale yellow.  Take over-the-counter and prescription medicines only as told by your doctor.  Do not drive or use heavy machinery while  taking prescription pain medicine.  Follow instructions from your doctor about what you can or cannot eat and drink.  Return to your normal activities as told by your doctor. Ask your doctor what activities are safe for you. Avoiding Future Gout Attacks  Follow a low-purine diet as told by a specialist (dietitian) or your doctor. Avoid foods and drinks that have a lot of purines, such as: ? Liver. ? Kidney. ? Anchovies. ? Asparagus. ? Herring. ? Mushrooms ? Mussels. ? Beer.  Limit alcohol intake to no more than 1 drink a day for nonpregnant women and 2 drinks a day for men. One drink equals 12 oz of beer, 5 oz of wine, or 1 oz of hard liquor.  Stay at a healthy weight or lose weight if you are overweight. If you want to lose weight, talk with your doctor. It is important that you do not lose weight too fast.  Start or continue an exercise plan as told by your doctor.  Drink enough fluids to keep your pee clear or pale yellow.  Take over-the-counter and prescription medicines only as told by your doctor.  Keep all follow-up visits as told by your doctor. This is important. Contact a doctor if:  You have another gout attack.  You still have symptoms of a gout attack after10 days of treatment.  You have problems (side effects) because of your medicines.  You have chills or a fever.  You have burning pain when you pee (  urinate).  You have pain in your lower back or belly. Get help right away if:  You have very bad pain.  Your pain cannot be controlled.  You cannot pee. This information is not intended to replace advice given to you by your health care provider. Make sure you discuss any questions you have with your health care provider. Document Released: 07/23/2008 Document Revised: 03/21/2016 Document Reviewed: 07/27/2015 Elsevier Interactive Patient Education  2018 [place] are compounds that affect the level of uric acid in your body.  A low-purine diet is a diet that is low in purines. Eating a low-purine diet can prevent the level of uric acid in your body from getting too high and causing gout or kidney stones or both. What do I need to know about this diet?  Choose low-purine foods. Examples of low-purine foods are listed in the next section.  Drink plenty of fluids, especially water. Fluids can help remove uric acid from your body. Try to drink 8-16 cups (1.9-3.8 L) a day.  Limit foods high in fat, especially saturated fat, as fat makes it harder for the body to get rid of uric acid. Foods high in saturated fat include pizza, cheese, ice cream, whole milk, fried foods, and gravies. Choose foods that are lower in fat and lean sources of protein. Use olive oil when cooking as it contains healthy fats that are not high in saturated fat.  Limit alcohol. Alcohol interferes with the elimination of uric acid from your body. If you are having a gout attack, avoid all alcohol.  Keep in mind that different people's bodies react differently to different foods. You will probably learn over time which foods do or do not affect you. If you discover that a food tends to cause your gout to flare up, avoid eating that food. You can more freely enjoy foods that do not cause problems. If you have any questions about a food item, talk to your dietitian or health care provider. Which foods are low, moderate, and high in purines? The following is a list of foods that are low, moderate, and high in purines. You can eat any amount of the foods that are low in purines. You may be able to have small amounts of foods that are moderate in purines. Ask your health care provider how much of a food moderate in purines you can have. Avoid foods high in purines. Grains  Foods low in purines: Enriched white bread, pasta, rice, cake, cornbread, popcorn.  Foods moderate in purines: Whole-grain breads and cereals, wheat germ, bran, oatmeal. Uncooked oatmeal.  Dry wheat bran or wheat germ.  Foods high in purines: Pancakes, [place] toast, biscuits, muffins. Vegetables  Foods low in purines: All vegetables, except those that are moderate in purines.  Foods moderate in purines: Asparagus, cauliflower, spinach, mushrooms, green peas. Fruits  All fruits are low in purines. Meats and other Protein Foods  Foods low in purines: Eggs, nuts, peanut butter.  Foods moderate in purines: 80-90% lean beef, lamb, veal, pork, poultry, fish, eggs, peanut butter, nuts. Crab, lobster, oysters, and shrimp. Cooked dried beans, peas, and lentils.  Foods high in purines: Anchovies, sardines, herring, mussels, tuna, codfish, scallops, trout, and haddock. [place]ce Salines. Organ meats (such as liver or kidney). [place]. Game meat. Goose. Sweetbreads. Dairy  All dairy foods are low in purines. Low-fat and fat-free dairy products are best because they are low in saturated fat. Beverages  Drinks low in purines: Water, carbonated beverages, tea,  coffee, cocoa.  Drinks moderate in purines: Soft drinks and other drinks sweetened with high-fructose corn syrup. Juices. To find whether a food or drink is sweetened with high-fructose corn syrup, look at the ingredients list.  Drinks high in purines: Alcoholic beverages (such as beer). Condiments  Foods low in purines: Salt, herbs, olives, pickles, relishes, vinegar.  Foods moderate in purines: Butter, margarine, oils, mayonnaise. Fats and Oils  Foods low in purines: All types, except gravies and sauces made with meat.  Foods high in purines: Gravies and sauces made with meat. Other Foods  Foods low in purines: Sugars, sweets, gelatin. Cake. Soups made without meat.  Foods moderate in purines: Meat-based or fish-based soups, broths, or bouillons. Foods and drinks sweetened with high-fructose corn syrup.  Foods high in purines: High-fat desserts (such as ice cream, cookies, cakes, pies, doughnuts, and chocolate). Contact your  dietitian for more information on foods that are not listed here. This information is not intended to replace advice given to you by your health care provider. Make sure you discuss any questions you have with your health care provider. Document Released: 02/08/2011 Document Revised: 03/21/2016 Document Reviewed: 09/20/2013 Elsevier Interactive Patient Education  2017 [employer].  Endocrinology follow-up Return in the morning for your lab draw

## 2017-04-14 NOTE — Progress Notes (Signed)
Subjective:    Patient ID: Gregory Barrett, male    DOB: 1970/12/30, 46 y.o.   MRN: [id number]  HPI  46 year old patient who has been followed closely by cardiology with atrial fibrillation.  He has had a recent ablation but has reverted back to atrial fibrillation.  He is on rate control medication as well as anticoagulation He presents with a several day history of increasing pain and swelling involving the left knee.  He was seen at an urgent care where he had lab and x-rays performed.  Apparently uric acid level was elevated and he was told he had probable gout. He was given naproxen but has not taken due to anticoagulation therapy.  No prior history of gout.  No history of trauma  Past Medical History:  Diagnosis Date  . Arthritis    legs, back   . GERD (gastroesophageal reflux disease)   . Hyperaldosteronism ([place])   . Hypertension   . Melanoma of lower back ([place]) 2000s  . Migraine   . Persistent atrial fibrillation ([place])   . Pneumonia    "a few times; last time ~ 2010" (04/03/2017)     Social History   Social History  . Marital status: Married    Spouse name: N/A  . Number of children: N/A  . Years of education: N/A   Occupational History  . Not on file.   Social History Main Topics  . Smoking status: Former Smoker    Packs/day: 0.50    Years: 2.00    Types: Cigarettes    Quit date: 09/20/2010  . Smokeless tobacco: Former Systems developer    Types: Chew    Quit date: 11/03/2014  . Alcohol use 1.8 oz/week    3 Glasses of wine per week  . Drug use: No  . Sexual activity: Yes   Other Topics Concern  . Not on file   Social History Narrative  . No narrative on file    Past Surgical History:  Procedure Laterality Date  . ATRIAL FIBRILLATION ABLATION N/A 04/03/2017   Procedure: Atrial Fibrillation Ablation;  Surgeon: Thompson Grayer, MD;  Location: [place] [facility];  Service: Cardiovascular;  Laterality: N/A;  . CARDIOVERSION N/A 09/13/2016   Procedure: CARDIOVERSION;   Surgeon: Fay Records, MD;  Location: [facility] ENDOSCOPY;  Service: Cardiovascular;  Laterality: N/A;  . CARDIOVERSION N/A 10/02/2016   Procedure: CARDIOVERSION;  Surgeon: Larey Dresser, MD;  Location: [facility] ENDOSCOPY;  Service: Cardiovascular;  Laterality: N/A;  . CARDIOVERSION N/A 04/10/2017   Procedure: CARDIOVERSION;  Surgeon: Fay Records, MD;  Location: [place];  Service: Cardiovascular;  Laterality: N/A;  . HARDWARE REMOVAL Right 12/12/2015   Procedure: HARDWARE REMOVAL RIGHT TIBIAL;  Surgeon: Altamese [place], MD;  Location: [place];  Service: Orthopedics;  Laterality: Right;  . JOINT REPLACEMENT    . MELANOMA EXCISION     "back"  . ORIF NONUNION / MALUNION TIBIAL FRACTURE Right 2005   "put plates & screws in"  . TOTAL KNEE ARTHROPLASTY Right 03/19/2016   Procedure: RIGHT TOTAL KNEE ARTHROPLASTY;  Surgeon: Paralee Cancel, MD;  Location: [facility];  Service: Orthopedics;  Laterality: Right;  Marland Kitchen VASECTOMY      Family History  Problem Relation Age of Onset  . Hypertension Maternal Grandmother   . Cancer Maternal Grandfather        lung ca  . Adrenal disorder Neg Hx     No Known Allergies  Current Outpatient Prescriptions on File Prior to Visit  Medication Sig  Dispense Refill  . ALPRAZolam (XANAX) 0.25 MG tablet TAKE 1 TABLET BY MOUTH TWICE A DAY AS NEEDED FOR ANXIETY 30 tablet 0  . carvedilol (COREG) 25 MG tablet TAKE 1 TABLET (25 MG TOTAL) BY MOUTH 2 (TWO) TIMES DAILY. 180 tablet 0  . diltiazem (CARDIZEM) 30 MG tablet Take 30 mg by mouth See admin instructions. Take 1 tablet by mouth every 4 hours AS NEEDED for heart rate over 100     . diltiazem (DILACOR XR) 120 MG 24 hr capsule Take 1 capsule (120 mg total) by mouth daily. 30 capsule 3  . flecainide (TAMBOCOR) 100 MG tablet Take 1 tablet (100 mg total) by mouth 2 (two) times daily.    . rivaroxaban (XARELTO) 20 MG TABS tablet Take 1 tablet (20 mg total) by mouth daily with supper. (Patient taking differently: Take 20 mg by mouth daily. ) 90  tablet 3   No current facility-administered medications on file prior to visit.     BP 120/72 (BP Location: Left Arm, Patient Position: Sitting, Cuff Size: Normal)   Pulse 95   Temp 98.7 F (37.1 C) (Oral)   Ht 5\' 11"  (1.803 m)   Wt 295 lb 12.8 oz (134.2 kg)   SpO2 98%   BMI 41.26 kg/m     Review of Systems  Constitutional: Negative for appetite change, chills, fatigue and fever.  HENT: Negative for congestion, dental problem, ear pain, hearing loss, sore throat, tinnitus, trouble swallowing and voice change.   Eyes: Negative for pain, discharge and visual disturbance.  Respiratory: Negative for cough, chest tightness, wheezing and stridor.   Cardiovascular: Negative for chest pain, palpitations and leg swelling.  Gastrointestinal: Negative for abdominal distention, abdominal pain, blood in stool, constipation, diarrhea, nausea and vomiting.  Genitourinary: Negative for difficulty urinating, discharge, flank pain, genital sores, hematuria and urgency.  Musculoskeletal: Positive for arthralgias and joint swelling. Negative for back pain, gait problem, myalgias and neck stiffness.  Skin: Negative for rash.  Neurological: Negative for dizziness, syncope, speech difficulty, weakness, numbness and headaches.  Hematological: Negative for adenopathy. Does not bruise/bleed easily.  Psychiatric/Behavioral: Negative for behavioral problems and dysphoric mood. The patient is not nervous/anxious.        Objective:   Physical Exam  Constitutional: He appears well-developed and well-nourished. No distress.  Blood pressure well controlled at 120 over 72  Cardiovascular:  Rhythm is irregular with controlled ventricular response  Musculoskeletal:  Status post right total knee replacement Acute inflammatory changes of the left knee with tenderness, effusion.  Knee is slightly red and warm to touch          Assessment & Plan:   Probable gouty arthritis, left knee.  Will treat with oral  prednisone Primary hyperaldosteronism, probably secondary to bilateral adrenal hyperplasia.  Patient has been seen by endocrine who  wishes to confirmed with a repeat aldosterone and renin levels, with ratio.  Patient has been off of spironolactone.  Will return in the morning for a early morning lab draw.  We'll place on a low purine   Diet and follow clinically  [name]

## 2017-04-15 ENCOUNTER — Other Ambulatory Visit (INDEPENDENT_AMBULATORY_CARE_PROVIDER_SITE_OTHER): Payer: BLUE CROSS/BLUE SHIELD

## 2017-04-15 DIAGNOSIS — I10 Essential (primary) hypertension: Secondary | ICD-10-CM

## 2017-04-16 ENCOUNTER — Ambulatory Visit (HOSPITAL_COMMUNITY)
Admission: RE | Admit: 2017-04-16 | Discharge: 2017-04-16 | Disposition: A | Discharge status: Home / Self Care | Payer: BLUE CROSS/BLUE SHIELD | Source: Ambulatory Visit | Attending: Nurse Practitioner | Admitting: Nurse Practitioner

## 2017-04-16 ENCOUNTER — Other Ambulatory Visit (HOSPITAL_COMMUNITY): Payer: Self-pay | Admitting: *Deleted

## 2017-04-16 DIAGNOSIS — Z0189 Encounter for other specified special examinations: Secondary | ICD-10-CM | POA: Diagnosis not present

## 2017-04-16 DIAGNOSIS — I4891 Unspecified atrial fibrillation: Secondary | ICD-10-CM | POA: Insufficient documentation

## 2017-04-16 LAB — CBC
HCT: 41.7 % (ref 39.0–52.0)
Hemoglobin: 14.3 g/dL (ref 13.0–17.0)
MCH: 30.5 pg (ref 26.0–34.0)
MCHC: 34.3 g/dL (ref 30.0–36.0)
MCV: 88.9 fL (ref 78.0–100.0)
PLATELETS: 340 10*3/uL (ref 150–400)
RBC: 4.69 MIL/uL (ref 4.22–5.81)
RDW: 12.7 % (ref 11.5–15.5)
WBC: 12.9 10*3/uL — AB (ref 4.0–10.5)

## 2017-04-16 LAB — BASIC METABOLIC PANEL
ANION GAP: 9 (ref 5–15)
BUN: 21 mg/dL — AB (ref 6–20)
CO2: 25 mmol/L (ref 22–32)
Calcium: 8.8 mg/dL — ABNORMAL LOW (ref 8.9–10.3)
Chloride: 107 mmol/L (ref 101–111)
Creatinine, Ser: 1.13 mg/dL (ref 0.61–1.24)
GFR calc Af Amer: >60 mL/min (ref >60)
GFR calc non Af Amer: >60 mL/min (ref >60)
Glucose, Bld: 99 mg/dL (ref 65–99)
POTASSIUM: 3.4 mmol/L — AB (ref 3.5–5.1)
SODIUM: 141 mmol/L (ref 135–145)

## 2017-04-16 NOTE — Progress Notes (Addendum)
Pt in for repeat EKG since starting flecainide.  Pt HR today 105 and BP 120/78.  EKG to be reviewed by Roderic Palau, NP  EKG after starting flecainide 100 mg bid and shows aflutter at 105 bpm, qrs int 106 ms, qtc 412 ms. Pt just started on a prednisone taper for swollen left knee. After he finishes this, we will set up for cardioversion. Have scheduled for 7/2 and pre procedure labs drawn today.

## 2017-04-16 NOTE — Patient Instructions (Signed)
Cardioversion scheduled for Monday, July 2nd  - Arrive at the [facility] and go to admitting at 9:30AM  -Do not eat or drink anything after midnight the night prior to your procedure.  - Take all your medication with a sip of water prior to arrival.  - You will not be able to drive home after your procedure.

## 2017-04-16 NOTE — Anesthesia Postprocedure Evaluation (Signed)
Anesthesia Post Note  Patient: Gregory Barrett  Procedure(s) Performed: Procedure(s) (LRB): CARDIOVERSION (N/A)     Patient location during evaluation: PACU Anesthesia Type: General Level of consciousness: awake Pain management: pain level controlled Respiratory status: spontaneous breathing Cardiovascular status: stable Anesthetic complications: no    Last Vitals:  Vitals:   04/10/17 1414 04/10/17 1415  BP: 133/84 136/86  Pulse:    Resp: 15 15  Temp:  36.9 C    Last Pain:  Vitals:   04/10/17 1415  TempSrc: Oral                 Kamry Faraci

## 2017-04-18 ENCOUNTER — Encounter (INDEPENDENT_AMBULATORY_CARE_PROVIDER_SITE_OTHER): Payer: Self-pay

## 2017-04-19 LAB — ALDOSTERONE + RENIN ACTIVITY W/ RATIO
ALDO / PRA Ratio: 162.5 Ratio — ABNORMAL HIGH (ref 0.9–28.9)
Aldosterone: 13 ng/dL
PRA LC/MS/MS: 0.08 ng/mL/h — AB (ref 0.25–5.82)

## 2017-04-20 ENCOUNTER — Encounter: Payer: Self-pay | Admitting: Endocrinology

## 2017-04-21 ENCOUNTER — Encounter: Payer: Self-pay | Admitting: Internal Medicine

## 2017-04-28 ENCOUNTER — Ambulatory Visit (HOSPITAL_COMMUNITY): Payer: BLUE CROSS/BLUE SHIELD | Admitting: Anesthesiology

## 2017-04-28 ENCOUNTER — Encounter (HOSPITAL_COMMUNITY): Payer: Self-pay

## 2017-04-28 ENCOUNTER — Ambulatory Visit (HOSPITAL_COMMUNITY)
Admission: RE | Admit: 2017-04-28 | Discharge: 2017-04-28 | Disposition: A | Discharge status: Home / Self Care | Payer: BLUE CROSS/BLUE SHIELD | Source: Ambulatory Visit | Attending: Internal Medicine | Admitting: Internal Medicine

## 2017-04-28 ENCOUNTER — Encounter (HOSPITAL_COMMUNITY)
Admission: RE | Disposition: A | Discharge status: Home / Self Care | Payer: Self-pay | Source: Ambulatory Visit | Attending: Internal Medicine

## 2017-04-28 DIAGNOSIS — I481 Persistent atrial fibrillation: Secondary | ICD-10-CM | POA: Diagnosis not present

## 2017-04-28 DIAGNOSIS — Z801 Family history of malignant neoplasm of trachea, bronchus and lung: Secondary | ICD-10-CM | POA: Insufficient documentation

## 2017-04-28 DIAGNOSIS — M479 Spondylosis, unspecified: Secondary | ICD-10-CM | POA: Insufficient documentation

## 2017-04-28 DIAGNOSIS — I440 Atrioventricular block, first degree: Secondary | ICD-10-CM | POA: Insufficient documentation

## 2017-04-28 DIAGNOSIS — K219 Gastro-esophageal reflux disease without esophagitis: Secondary | ICD-10-CM | POA: Diagnosis not present

## 2017-04-28 DIAGNOSIS — Z7901 Long term (current) use of anticoagulants: Secondary | ICD-10-CM | POA: Diagnosis not present

## 2017-04-28 DIAGNOSIS — Z8249 Family history of ischemic heart disease and other diseases of the circulatory system: Secondary | ICD-10-CM | POA: Diagnosis not present

## 2017-04-28 DIAGNOSIS — Z87891 Personal history of nicotine dependence: Secondary | ICD-10-CM | POA: Insufficient documentation

## 2017-04-28 DIAGNOSIS — Z79899 Other long term (current) drug therapy: Secondary | ICD-10-CM | POA: Diagnosis not present

## 2017-04-28 DIAGNOSIS — M25561 Pain in right knee: Secondary | ICD-10-CM | POA: Diagnosis not present

## 2017-04-28 DIAGNOSIS — Z9660 Presence of unspecified orthopedic joint implant: Secondary | ICD-10-CM | POA: Diagnosis not present

## 2017-04-28 DIAGNOSIS — M1990 Unspecified osteoarthritis, unspecified site: Secondary | ICD-10-CM | POA: Insufficient documentation

## 2017-04-28 DIAGNOSIS — I10 Essential (primary) hypertension: Secondary | ICD-10-CM | POA: Diagnosis not present

## 2017-04-28 DIAGNOSIS — I4891 Unspecified atrial fibrillation: Secondary | ICD-10-CM | POA: Diagnosis not present

## 2017-04-28 DIAGNOSIS — G43909 Migraine, unspecified, not intractable, without status migrainosus: Secondary | ICD-10-CM | POA: Insufficient documentation

## 2017-04-28 DIAGNOSIS — I498 Other specified cardiac arrhythmias: Secondary | ICD-10-CM | POA: Diagnosis not present

## 2017-04-28 HISTORY — PX: CARDIOVERSION: SHX1299

## 2017-04-28 SURGERY — CARDIOVERSION
Anesthesia: General

## 2017-04-28 MED ORDER — LIDOCAINE 2% (20 MG/ML) 5 ML SYRINGE
INTRAMUSCULAR | Status: DC | PRN
  Administered 2017-04-28: 80 mg via INTRAVENOUS

## 2017-04-28 MED ORDER — SODIUM CHLORIDE 0.9 % IV SOLN
INTRAVENOUS | Status: DC
Start: 2017-04-28 — End: 2017-04-28
  Administered 2017-04-28: 10:00:00 via INTRAVENOUS

## 2017-04-28 MED ORDER — PROPOFOL 10 MG/ML IV BOLUS
INTRAVENOUS | Status: DC | PRN
  Administered 2017-04-28: 100 mg via INTRAVENOUS
  Administered 2017-04-28: 50 mg via INTRAVENOUS
  Administered 2017-04-28: 150 mg via INTRAVENOUS

## 2017-04-28 NOTE — CV Procedure (Signed)
   CARDIOVERSION NOTE  Procedure: Electrical Cardioversion Indications:  Atrial Fibrillation  Procedure Details:  Consent: Risks of procedure as well as the alternatives and risks of each were explained to the (patient/caregiver).  Consent for procedure obtained.  Time Out: Verified patient identification, verified procedure, site/side was marked, verified correct patient position, special equipment/implants available, medications/allergies/relevent history reviewed, required imaging and test results available.  Performed  Patient placed on cardiac monitor, pulse oximetry, supplemental oxygen as necessary.  Sedation given: Propofol per anesthesia Pacer pads placed anterior and posterior chest.  Cardioverted 3 time(s).  Cardioverted at [place] ([place]) successful.  Impression: Findings: Post procedure EKG shows: NSR Complications: None Patient did tolerate procedure well.  Plan: 1. Ultimately successful DCCV with the [facility] at [place]. 2. Follow-up in a-fib clinic.  Time Spent Directly with the Patient:  30 minutes   Gregory Casino, MD, [place]  Attending Cardiologist  Direct Dial: [phone]  Fax: [phone]  Website:  [email or web address] 04/28/2017, 11:12 AM

## 2017-04-28 NOTE — Anesthesia Preprocedure Evaluation (Signed)
Anesthesia Evaluation  Patient identified by MRN, date of birth, ID band Patient awake    Reviewed: Allergy & Precautions, NPO status , Patient's Chart, lab work & pertinent test results, reviewed documented beta blocker date and time   Airway Mallampati: II  TM Distance: >3 FB Neck ROM: Full    Dental  (+) Teeth Intact   Pulmonary former smoker,    breath sounds clear to auscultation       Cardiovascular hypertension, Pt. on medications  Rhythm:Regular Rate:Normal     Neuro/Psych  Headaches,    GI/Hepatic GERD  ,  Endo/Other    Renal/GU      Musculoskeletal  (+) Arthritis ,   Abdominal   Peds  Hematology   Anesthesia Other Findings   Reproductive/Obstetrics                             Anesthesia Physical  Anesthesia Plan  ASA: III  Anesthesia Plan: General   Post-op Pain Management:    Induction: Intravenous  PONV Risk Score and Plan: 2 and Ondansetron, Propofol, Midazolam and Treatment may vary due to age or medical condition  Airway Management Planned: Mask  Additional Equipment:   Intra-op Plan:   Post-operative Plan:   Informed Consent: I have reviewed the patients History and Physical, chart, labs and discussed the procedure including the risks, benefits and alternatives for the proposed anesthesia with the patient or authorized representative who has indicated his/her understanding and acceptance.   Dental advisory given  Plan Discussed with: CRNA and Anesthesiologist  Anesthesia Plan Comments:         Anesthesia Quick Evaluation

## 2017-04-28 NOTE — H&P (Signed)
   INTERVAL PROCEDURE H&P  History and Physical Interval Note:  04/28/2017 10:22 AM  Gregory Barrett has presented today for their planned procedure. The various methods of treatment have been discussed with the patient and family. After consideration of risks, benefits and other options for treatment, the patient has consented to the procedure.  The patients' outpatient history has been reviewed, patient examined, and no change in status from most recent office note within the past 30 days. I have reviewed the patients' chart and labs and will proceed as planned. Questions were answered to the patient's satisfaction.   Pixie Casino, MD, [place]  Attending Cardiologist  Direct Dial: [phone]  Fax: [phone]  Website:  [email or web address]  Nadean Corwin Teigan Manner 04/28/2017, 10:22 AM

## 2017-04-28 NOTE — Transfer of Care (Signed)
Immediate Anesthesia Transfer of Care Note  Patient: Gregory Barrett  Procedure(s) Performed: Procedure(s): CARDIOVERSION (N/A)  Patient Location: Endoscopy Unit  Anesthesia Type:General  Level of Consciousness: awake, alert  and oriented  Airway & Oxygen Therapy: Patient Spontanous Breathing  Post-op Assessment: Report given to RN and Post -op Vital signs reviewed and stable  Post vital signs: Reviewed and stable  Last Vitals:  Vitals:   04/28/17 0938  BP: (!) 139/93  Pulse: 78  Resp: 12  Temp: 36.9 C    Last Pain:  Vitals:   04/28/17 0938  TempSrc: Oral         Complications: No apparent anesthesia complications

## 2017-04-28 NOTE — Anesthesia Postprocedure Evaluation (Signed)
Anesthesia Post Note  Patient: Gregory Barrett  Procedure(s) Performed: Procedure(s) (LRB): CARDIOVERSION (N/A)     Patient location during evaluation: PACU Anesthesia Type: General Level of consciousness: awake and alert Pain management: pain level controlled Vital Signs Assessment: post-procedure vital signs reviewed and stable Respiratory status: spontaneous breathing, nonlabored ventilation and respiratory function stable Cardiovascular status: blood pressure returned to baseline and stable Postop Assessment: no signs of nausea or vomiting Anesthetic complications: no    Last Vitals:  Vitals:   04/28/17 1105 04/28/17 1110  BP: (!) 136/104 128/89  Pulse:  73  Resp: 16 16  Temp: 36.6 C     Last Pain:  Vitals:   04/28/17 1105  TempSrc: Oral                 Lynda Rainwater

## 2017-04-28 NOTE — Discharge Instructions (Signed)
Electrical Cardioversion, Care After °This sheet gives you information about how to care for yourself after your procedure. Your health care provider may also give you more specific instructions. If you have problems or questions, contact your health care provider. °What can I expect after the procedure? °After the procedure, it is common to have: °· Some redness on the skin where the shocks were given. ° °Follow these instructions at home: °· Do not drive for 24 hours if you were given a medicine to help you relax (sedative). °· Take over-the-counter and prescription medicines only as told by your health care provider. °· Ask your health care provider how to check your pulse. Check it often. °· Rest for 48 hours after the procedure or as told by your health care provider. °· Avoid or limit your caffeine use as told by your health care provider. °Contact a health care provider if: °· You feel like your heart is beating too quickly or your pulse is not regular. °· You have a serious muscle cramp that does not go away. °Get help right away if: °· You have discomfort in your chest. °· You are dizzy or you feel faint. °· You have trouble breathing or you are short of breath. °· Your speech is slurred. °· You have trouble moving an arm or leg on one side of your body. °· Your fingers or toes turn cold or blue. °This information is not intended to replace advice given to you by your health care provider. Make sure you discuss any questions you have with your health care provider. °Document Released: 08/04/2013 Document Revised: 05/17/2016 Document Reviewed: 04/19/2016 °Elsevier Interactive Patient Education © 2018 Elsevier Inc. ° °

## 2017-05-01 ENCOUNTER — Encounter (HOSPITAL_COMMUNITY): Payer: Self-pay | Admitting: Nurse Practitioner

## 2017-05-01 ENCOUNTER — Encounter (HOSPITAL_COMMUNITY): Payer: BLUE CROSS/BLUE SHIELD | Admitting: Nurse Practitioner

## 2017-05-02 ENCOUNTER — Ambulatory Visit (INDEPENDENT_AMBULATORY_CARE_PROVIDER_SITE_OTHER): Payer: BLUE CROSS/BLUE SHIELD | Admitting: Endocrinology

## 2017-05-02 ENCOUNTER — Encounter: Payer: Self-pay | Admitting: Endocrinology

## 2017-05-02 DIAGNOSIS — R7989 Other specified abnormal findings of blood chemistry: Secondary | ICD-10-CM

## 2017-05-02 MED ORDER — POTASSIUM CHLORIDE CRYS ER 20 MEQ PO TBCR: 20.0000 meq | EXTENDED_RELEASE_TABLET | Freq: Every day | ORAL | 11 refills | Status: DC

## 2017-05-02 NOTE — Patient Instructions (Addendum)
I have sent a prescription to your pharmacy, to add a potassium pill.  Please see a radiology specialist.  you will receive a phone call, about a day and time for an appointment.

## 2017-05-02 NOTE — Progress Notes (Signed)
Subjective:    Patient ID: Gregory Barrett, male    DOB: Aug 20, 1971, 46 y.o.   MRN: [id number]  HPI Pt returns for f/u of high also/PRA ratio (HTN was dx'ed in 2012, and hypokalemia in 2015; labs showed high ratio, but normal aldo; he recently had ablation procedure tomorrow, for AF; 24-HR urine for catechols and metas was normal).  Palpitations persist. Pt says Dr Rayann Heman told him to wait for 3 mos before doing the venous sampling.   Past Medical History:  Diagnosis Date  . Arthritis    legs, back   . GERD (gastroesophageal reflux disease)   . Hyperaldosteronism ([place])   . Hypertension   . Melanoma of lower back ([place]) 2000s  . Migraine   . Persistent atrial fibrillation ([place])   . Pneumonia    "a few times; last time ~ 2010" (04/03/2017)    Past Surgical History:  Procedure Laterality Date  . ATRIAL FIBRILLATION ABLATION N/A 04/03/2017   Procedure: Atrial Fibrillation Ablation;  Surgeon: Thompson Grayer, MD;  Location: [place] [facility] LAB;  Service: Cardiovascular;  Laterality: N/A;  . CARDIOVERSION N/A 09/13/2016   Procedure: CARDIOVERSION;  Surgeon: Fay Records, MD;  Location: [place];  Service: Cardiovascular;  Laterality: N/A;  . CARDIOVERSION N/A 10/02/2016   Procedure: CARDIOVERSION;  Surgeon: Larey Dresser, MD;  Location: [facility];  Service: Cardiovascular;  Laterality: N/A;  . CARDIOVERSION N/A 04/10/2017   Procedure: CARDIOVERSION;  Surgeon: Fay Records, MD;  Location: [place];  Service: Cardiovascular;  Laterality: N/A;  . CARDIOVERSION N/A 04/28/2017   Procedure: CARDIOVERSION;  Surgeon: Pixie Casino, MD;  Location: [place];  Service: Cardiovascular;  Laterality: N/A;  . HARDWARE REMOVAL Right 12/12/2015   Procedure: HARDWARE REMOVAL RIGHT TIBIAL;  Surgeon: Altamese , MD;  Location: [facility];  Service: Orthopedics;  Laterality: Right;  . JOINT REPLACEMENT    . MELANOMA EXCISION     "back"  . ORIF NONUNION / MALUNION TIBIAL FRACTURE Right 2005   "put  plates & screws in"  . TOTAL KNEE ARTHROPLASTY Right 03/19/2016   Procedure: RIGHT TOTAL KNEE ARTHROPLASTY;  Surgeon: Paralee Cancel, MD;  Location: [facility];  Service: Orthopedics;  Laterality: Right;  Marland Kitchen VASECTOMY      Social History   Social History  . Marital status: Married    Spouse name: N/A  . Number of children: N/A  . Years of education: N/A   Occupational History  . Not on file.   Social History Main Topics  . Smoking status: Former Smoker    Packs/day: 0.50    Years: 2.00    Types: Cigarettes    Quit date: 09/20/2010  . Smokeless tobacco: Former Systems developer    Types: Chew    Quit date: 11/03/2014  . Alcohol use 1.8 oz/week    3 Glasses of wine per week  . Drug use: No  . Sexual activity: Yes   Other Topics Concern  . Not on file   Social History Narrative  . No narrative on file    Current Outpatient Prescriptions on File Prior to Visit  Medication Sig Dispense Refill  . ALPRAZolam (XANAX) 0.25 MG tablet TAKE 1 TABLET BY MOUTH TWICE A DAY AS NEEDED FOR ANXIETY 30 tablet 0  . diltiazem (CARDIZEM) 30 MG tablet Take 30 mg by mouth See admin instructions. Take 1 tablet by mouth every 4 hours AS NEEDED for heart rate over 100     . diltiazem (DILACOR XR) 120 MG 24  hr capsule Take 1 capsule (120 mg total) by mouth daily. 30 capsule 3  . esomeprazole (NEXIUM) 20 MG capsule Take 20 mg by mouth daily.    . flecainide (TAMBOCOR) 100 MG tablet Take 1 tablet (100 mg total) by mouth 2 (two) times daily.    . rivaroxaban (XARELTO) 20 MG TABS tablet Take 1 tablet (20 mg total) by mouth daily with supper. (Patient taking differently: Take 20 mg by mouth daily. ) 90 tablet 3  . carvedilol (COREG) 25 MG tablet TAKE 1 TABLET (25 MG TOTAL) BY MOUTH 2 (TWO) TIMES DAILY. 180 tablet 0   No current facility-administered medications on file prior to visit.     No Known Allergies  Family History  Problem Relation Age of Onset  . Hypertension Maternal Grandmother   . Cancer Maternal  Grandfather        lung ca  . Adrenal disorder Neg Hx     BP 124/82   Pulse 72   Ht 5\' 11"  (1.803 m)   Wt 292 lb (132.5 kg)   SpO2 97%   BMI 40.73 kg/m   Review of Systems Denies cramps.      Objective:   Physical Exam VITAL SIGNS:  See vs page.  GENERAL: no distress.  HEART:  Regular rate and rhythm without murmurs noted. Normal S1,S2.   Ext: no edema.   Skin: not diaphoretic Neuro: no tremor PSYCH: Does not appear anxious nor depressed.    Lab Results  Component Value Date   CREATININE 1.13 04/16/2017   BUN 21 (H) 04/16/2017   NA 141 04/16/2017   K 3.4 (L) 04/16/2017   CL 107 04/16/2017   CO2 25 04/16/2017      Assessment & Plan:  Hypokalemia, persistent.  I rx'ed KLOR.  recheck 2 weeks.   HTN, with high aldo/PRA ratio: we'll schedule venous sampling.  AF: Dr Rayann Heman has requested venous sampling wait a few mos.  [place] with me.   Patient Instructions  I have sent a prescription to your pharmacy, to add a potassium pill.  Please see a radiology specialist.  you will receive a phone call, about a day and time for an appointment.

## 2017-05-04 ENCOUNTER — Encounter (HOSPITAL_COMMUNITY): Payer: Self-pay | Admitting: Nurse Practitioner

## 2017-05-07 ENCOUNTER — Other Ambulatory Visit: Payer: Self-pay | Admitting: Internal Medicine

## 2017-05-07 ENCOUNTER — Ambulatory Visit (HOSPITAL_COMMUNITY)
Admission: RE | Admit: 2017-05-07 | Discharge: 2017-05-07 | Disposition: A | Discharge status: Home / Self Care | Payer: BLUE CROSS/BLUE SHIELD | Source: Ambulatory Visit | Attending: Nurse Practitioner | Admitting: Nurse Practitioner

## 2017-05-07 ENCOUNTER — Other Ambulatory Visit: Payer: Self-pay | Admitting: Endocrinology

## 2017-05-07 ENCOUNTER — Encounter (HOSPITAL_COMMUNITY): Payer: Self-pay | Admitting: Nurse Practitioner

## 2017-05-07 ENCOUNTER — Other Ambulatory Visit (HOSPITAL_COMMUNITY): Payer: Self-pay | Admitting: *Deleted

## 2017-05-07 VITALS — BP 142/96 | HR 70 | Ht 71.0 in | Wt 289.2 lb

## 2017-05-07 DIAGNOSIS — Z8249 Family history of ischemic heart disease and other diseases of the circulatory system: Secondary | ICD-10-CM | POA: Insufficient documentation

## 2017-05-07 DIAGNOSIS — Z87891 Personal history of nicotine dependence: Secondary | ICD-10-CM | POA: Insufficient documentation

## 2017-05-07 DIAGNOSIS — Z9889 Other specified postprocedural states: Secondary | ICD-10-CM | POA: Insufficient documentation

## 2017-05-07 DIAGNOSIS — R7989 Other specified abnormal findings of blood chemistry: Secondary | ICD-10-CM

## 2017-05-07 DIAGNOSIS — Z801 Family history of malignant neoplasm of trachea, bronchus and lung: Secondary | ICD-10-CM | POA: Insufficient documentation

## 2017-05-07 DIAGNOSIS — I10 Essential (primary) hypertension: Secondary | ICD-10-CM | POA: Insufficient documentation

## 2017-05-07 DIAGNOSIS — I4819 Other persistent atrial fibrillation: Secondary | ICD-10-CM

## 2017-05-07 DIAGNOSIS — Z96651 Presence of right artificial knee joint: Secondary | ICD-10-CM | POA: Diagnosis not present

## 2017-05-07 DIAGNOSIS — Z85820 Personal history of malignant melanoma of skin: Secondary | ICD-10-CM | POA: Diagnosis not present

## 2017-05-07 DIAGNOSIS — K219 Gastro-esophageal reflux disease without esophagitis: Secondary | ICD-10-CM | POA: Diagnosis not present

## 2017-05-07 DIAGNOSIS — I481 Persistent atrial fibrillation: Secondary | ICD-10-CM

## 2017-05-07 DIAGNOSIS — Z79899 Other long term (current) drug therapy: Secondary | ICD-10-CM | POA: Diagnosis not present

## 2017-05-07 DIAGNOSIS — Z7901 Long term (current) use of anticoagulants: Secondary | ICD-10-CM | POA: Insufficient documentation

## 2017-05-07 NOTE — Progress Notes (Signed)
Primary Care Physician: Marletta Lor, MD Referring Physician: Dr. Levan Hurst is a 46 y.o. male with a h/o paroxysmal afib on flecainide. He has recently gone thru evalution for possible primary hyperaldosteronism. Dr. Rayann Heman had seen pt in the past and offered to ablate but wanted this evaluated first. He has been given the go ahead by his PCP for ablation. Pt is ready to have this done as he is having days where the afib is making him lightheaded and fatigued.He continues on flecainide but he feels that he is in afib most of the time now.  F/u afib clinic 6/12. He had ablation on 6/15. He went back into afib Saturday. He has not missed any anticoagulation doses. No swallowing or groin issues.  F/u 7/11. He was started back on flecainide and had successful cardioversion 7/2 but noticed a lot of palpitations after ward which then settled down. He had a bad night Saturday night where he felt that his heart beat was slow and he felt lightheaded and  felt short of breath. He took a xanax and the next morning he felt improved and heart rhythm has been much better since then. Ekg today shows SR at 70 bpm.  Today, he denies symptoms of  chest pain, orthopnea, PND, lower extremity edema, dizziness, presyncope, syncope, or neurologic sequela.  The patient is tolerating medications without difficulties and is otherwise without complaint today.   Past Medical History:  Diagnosis Date  . Arthritis    legs, back   . GERD (gastroesophageal reflux disease)   . Hyperaldosteronism ([place])   . Hypertension   . Melanoma of lower back ([place]) 2000s  . Migraine   . Persistent atrial fibrillation ([place])   . Pneumonia    "a few times; last time ~ 2010" (04/03/2017)   Past Surgical History:  Procedure Laterality Date  . ATRIAL FIBRILLATION ABLATION N/A 04/03/2017   Procedure: Atrial Fibrillation Ablation;  Surgeon: Thompson Grayer, MD;  Location: [facility];  Service: Cardiovascular;   Laterality: N/A;  . CARDIOVERSION N/A 09/13/2016   Procedure: CARDIOVERSION;  Surgeon: Fay Records, MD;  Location: [facility];  Service: Cardiovascular;  Laterality: N/A;  . CARDIOVERSION N/A 10/02/2016   Procedure: CARDIOVERSION;  Surgeon: Larey Dresser, MD;  Location: [facility];  Service: Cardiovascular;  Laterality: N/A;  . CARDIOVERSION N/A 04/10/2017   Procedure: CARDIOVERSION;  Surgeon: Fay Records, MD;  Location: [facility];  Service: Cardiovascular;  Laterality: N/A;  . CARDIOVERSION N/A 04/28/2017   Procedure: CARDIOVERSION;  Surgeon: Pixie Casino, MD;  Location: [facility];  Service: Cardiovascular;  Laterality: N/A;  . HARDWARE REMOVAL Right 12/12/2015   Procedure: HARDWARE REMOVAL RIGHT TIBIAL;  Surgeon: Altamese Mahtomedi, MD;  Location: [facility];  Service: Orthopedics;  Laterality: Right;  . JOINT REPLACEMENT    . MELANOMA EXCISION     "back"  . ORIF NONUNION / MALUNION TIBIAL FRACTURE Right 2005   "put plates & screws in"  . TOTAL KNEE ARTHROPLASTY Right 03/19/2016   Procedure: RIGHT TOTAL KNEE ARTHROPLASTY;  Surgeon: Paralee Cancel, MD;  Location: [facility];  Service: Orthopedics;  Laterality: Right;  Marland Kitchen VASECTOMY      Current Outpatient Prescriptions  Medication Sig Dispense Refill  . ALPRAZolam (XANAX) 0.25 MG tablet TAKE 1 TABLET BY MOUTH TWICE A DAY AS NEEDED FOR ANXIETY 30 tablet 0  . carvedilol (COREG) 25 MG tablet TAKE 1 TABLET (25 MG TOTAL) BY MOUTH 2 (TWO) TIMES DAILY. [place]  tablet 0  . diltiazem (CARDIZEM) 30 MG tablet Take 30 mg by mouth See admin instructions. Take 1 tablet by mouth every 4 hours AS NEEDED for heart rate over 100     . diltiazem (DILACOR XR) 120 MG 24 hr capsule Take 1 capsule (120 mg total) by mouth daily. 30 capsule 3  . esomeprazole (NEXIUM) 20 MG capsule Take 20 mg by mouth daily.    . flecainide (TAMBOCOR) 100 MG tablet Take 1 tablet (100 mg total) by mouth 2 (two) times daily.    . potassium chloride SA (K-DUR,KLOR-CON) 20 MEQ tablet Take 1  tablet (20 mEq total) by mouth daily. 30 tablet 11  . rivaroxaban (XARELTO) 20 MG TABS tablet Take 1 tablet (20 mg total) by mouth daily with supper. (Patient taking differently: Take 20 mg by mouth daily. ) 90 tablet 3   No current facility-administered medications for this encounter.     No Known Allergies  Social History   Social History  . Marital status: Married    Spouse name: N/A  . Number of children: N/A  . Years of education: N/A   Occupational History  . Not on file.   Social History Main Topics  . Smoking status: Former Smoker    Packs/day: 0.50    Years: 2.00    Types: Cigarettes    Quit date: 09/20/2010  . Smokeless tobacco: Former Systems developer    Types: Chew    Quit date: 11/03/2014  . Alcohol use 1.8 oz/week    3 Glasses of wine per week  . Drug use: No  . Sexual activity: Yes   Other Topics Concern  . Not on file   Social History Narrative  . No narrative on file    Family History  Problem Relation Age of Onset  . Hypertension Maternal Grandmother   . Cancer Maternal Grandfather        lung ca  . Adrenal disorder Neg Hx     ROS- All systems are reviewed and negative except as per the HPI above  Physical Exam: Vitals:   05/07/17 1534  BP: (!) 142/96  Pulse: 70  Weight: 289 lb 3.2 oz (131.2 kg)  Height: 5\' 11"  (1.803 m)   Wt Readings from Last 3 Encounters:  05/07/17 289 lb 3.2 oz (131.2 kg)  05/02/17 292 lb (132.5 kg)  04/28/17 293 lb (132.9 kg)    Labs: Lab Results  Component Value Date   NA 141 04/16/2017   K 3.4 (L) 04/16/2017   CL 107 04/16/2017   CO2 25 04/16/2017   GLUCOSE 99 04/16/2017   BUN 21 (H) 04/16/2017   CREATININE 1.13 04/16/2017   CALCIUM 8.8 (L) 04/16/2017   MG 1.9 02/14/2017   Lab Results  Component Value Date   INR 1.3 (H) 10/01/2016   Lab Results  Component Value Date   CHOL 168 07/01/2014   HDL 38.00 (L) 07/01/2014   LDLCALC 115 (H) 07/01/2014   TRIG 77.0 07/01/2014     GEN- The patient is well  appearing, alert and oriented x 3 today.   Head- normocephalic, atraumatic Eyes-  Sclera clear, conjunctiva pink Ears- hearing intact Oropharynx- clear Neck- supple, no JVP Lymph- no cervical lymphadenopathy Lungs- Clear to ausculation bilaterally, normal work of breathing Heart- regular rate and rhythm, no murmurs, rubs or gallops, PMI not laterally displaced GI- soft, NT, ND, + BS Extremities- no clubbing, cyanosis, or edema MS- no significant deformity or atrophy Skin- no rash or lesion Psych- euthymic  mood, full affect Neuro- strength and sensation are intact  EKG-NSR at 70 bpm, Pr int 188 bpm, qrs int 98 ms, qtc 449 ms Epic records reviewed    Assessment and Plan: 1. Persistent  afib s/p ablation In SR after flecainide 100 mg bid and cardioversion Continue  cardizem and carvedilol Continue xarelto without missed doses Pt states that he is pending an adrenal biopsy but this will have to wait until he is seen by Dr. Rayann Heman 9/10, so as not to stop anticoagulation for the 3 months following ablation  2. HTN Stable  F/u Dr. Rayann Heman 9/10  Geroge Baseman. Maxene Byington, [facility] [address] [place], [place] [address] [phone]

## 2017-05-08 ENCOUNTER — Telehealth (HOSPITAL_COMMUNITY): Payer: Self-pay | Admitting: Radiology

## 2017-05-08 NOTE — Telephone Encounter (Signed)
Have called both patient and spouse and left messages for both. Adrenal vein sampling is set up for Tuesday, May 13, 2017. JM

## 2017-05-08 NOTE — Telephone Encounter (Signed)
Called pt to schedule his adrenal venous sampling that Dr. Loanne Drilling wanted him to have. The patient states that he does not want to do this procedure until September. He says he will call us back when he is ready to schedule. JM

## 2017-05-13 ENCOUNTER — Ambulatory Visit (HOSPITAL_COMMUNITY): Admission: RE | Admit: 2017-05-13 | Payer: BLUE CROSS/BLUE SHIELD | Source: Ambulatory Visit

## 2017-05-30 ENCOUNTER — Encounter: Payer: Self-pay | Admitting: Internal Medicine

## 2017-05-30 ENCOUNTER — Other Ambulatory Visit: Payer: Self-pay | Admitting: Internal Medicine

## 2017-05-30 DIAGNOSIS — I10 Essential (primary) hypertension: Secondary | ICD-10-CM

## 2017-05-30 MED ORDER — PREDNISONE 20 MG PO TABS: 20.0000 mg | ORAL_TABLET | Freq: Two times a day (BID) | ORAL | 0 refills | Status: DC

## 2017-06-08 ENCOUNTER — Other Ambulatory Visit: Payer: Self-pay | Admitting: Cardiology

## 2017-06-09 NOTE — Telephone Encounter (Signed)
Will forward to Dr. Jackalyn Lombard nurse to address (pt is no longer pt of Dr. Curt Bears).

## 2017-06-09 NOTE — Telephone Encounter (Signed)
Gregory Barrett's note from July states patient is still taking.

## 2017-06-10 NOTE — Telephone Encounter (Signed)
New Message   Pt is completely out of medication   *STAT* If patient is at the pharmacy, call can be transferred to refill team.   1. Which medications need to be refilled? (please list name of each medication and dose if known)  flecainide 100  2. Which pharmacy/location (including street and city if local pharmacy) is medication to be sent to? [facility]  3. Do they need a 30 day or 90 day supply?  [place]

## 2017-07-03 ENCOUNTER — Other Ambulatory Visit: Payer: Self-pay | Admitting: Endocrinology

## 2017-07-03 DIAGNOSIS — R7989 Other specified abnormal findings of blood chemistry: Secondary | ICD-10-CM

## 2017-07-07 ENCOUNTER — Encounter: Payer: Self-pay | Admitting: Internal Medicine

## 2017-07-07 ENCOUNTER — Ambulatory Visit (INDEPENDENT_AMBULATORY_CARE_PROVIDER_SITE_OTHER): Payer: BLUE CROSS/BLUE SHIELD | Admitting: Internal Medicine

## 2017-07-07 VITALS — BP 124/90 | HR 69 | Ht 71.0 in | Wt 297.2 lb

## 2017-07-07 DIAGNOSIS — I481 Persistent atrial fibrillation: Secondary | ICD-10-CM

## 2017-07-07 DIAGNOSIS — I4819 Other persistent atrial fibrillation: Secondary | ICD-10-CM

## 2017-07-07 NOTE — Patient Instructions (Signed)
Medication Instructions:  Your physician has recommended you make the following change in your medication:  1) STOP Xarelto   Labwork: None ordered   Testing/Procedures: None ordered   Follow-Up: Your physician wants you to follow-up in: 3 months with Dr. Rayann Heman   Any Other Special Instructions Will Be Listed Below (If Applicable).     If you need a refill on your cardiac medications before your next appointment, please call your pharmacy.

## 2017-07-07 NOTE — Progress Notes (Signed)
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  Since his recent afib ablation, the patient reports doing very well.  he denies procedure related complications and is pleased with the results of the procedure.  Today, he denies symptoms of palpitations, chest pain, shortness of breath,  lower extremity edema, dizziness, presyncope, or syncope.  The patient is otherwise without complaint today.   Past Medical History:  Diagnosis Date  . Arthritis    legs, back   . GERD (gastroesophageal reflux disease)   . Hyperaldosteronism ([place])   . Hypertension   . Melanoma of lower back ([place]) 2000s  . Migraine   . Persistent atrial fibrillation ([place])   . Pneumonia    "a few times; last time ~ 2010" (04/03/2017)   Past Surgical History:  Procedure Laterality Date  . ATRIAL FIBRILLATION ABLATION N/A 04/03/2017   Procedure: Atrial Fibrillation Ablation;  Surgeon: Thompson Grayer, MD;  Location: [facility];  Service: Cardiovascular;  Laterality: N/A;  . CARDIOVERSION N/A 09/13/2016   Procedure: CARDIOVERSION;  Surgeon: Fay Records, MD;  Location: [facility];  Service: Cardiovascular;  Laterality: N/A;  . CARDIOVERSION N/A 10/02/2016   Procedure: CARDIOVERSION;  Surgeon: Larey Dresser, MD;  Location: [facility];  Service: Cardiovascular;  Laterality: N/A;  . CARDIOVERSION N/A 04/10/2017   Procedure: CARDIOVERSION;  Surgeon: Fay Records, MD;  Location: [facility];  Service: Cardiovascular;  Laterality: N/A;  . CARDIOVERSION N/A 04/28/2017   Procedure: CARDIOVERSION;  Surgeon: Pixie Casino, MD;  Location: [facility];  Service: Cardiovascular;  Laterality: N/A;  . HARDWARE REMOVAL Right 12/12/2015   Procedure: HARDWARE REMOVAL RIGHT TIBIAL;  Surgeon: Altamese Chemung, MD;  Location: [facility];  Service: Orthopedics;  Laterality: Right;  . JOINT REPLACEMENT    . MELANOMA EXCISION     "back"  .  ORIF NONUNION / MALUNION TIBIAL FRACTURE Right 2005   "put plates & screws in"  . TOTAL KNEE ARTHROPLASTY Right 03/19/2016   Procedure: RIGHT TOTAL KNEE ARTHROPLASTY;  Surgeon: Paralee Cancel, MD;  [facility];  Service: Orthopedics;  Laterality: Right;  Marland Kitchen VASECTOMY      ROS- all systems are personally reviewed and negatives except as per HPI above  Current Outpatient Prescriptions  Medication Sig Dispense Refill  . ALPRAZolam (XANAX) 0.25 MG tablet TAKE 1 TABLET BY MOUTH TWICE A DAY AS NEEDED FOR ANXIETY 30 tablet 0  . carvedilol (COREG) 25 MG tablet TAKE 1 TABLET (25 MG TOTAL) BY MOUTH 2 (TWO) TIMES DAILY. 180 tablet 0  . diltiazem (CARDIZEM CD) 120 MG 24 hr capsule Take 120 mg by mouth daily.  3  . esomeprazole (NEXIUM) 20 MG capsule Take 20 mg by mouth daily.    . flecainide (TAMBOCOR) 100 MG tablet Take 1 tablet (100 mg total) by mouth 2 (two) times daily. Please keep 07/07/17 for future refill authorization 60 tablet 1  . polyvinyl alcohol (LUBRICANT DROPS) 1.4 % ophthalmic solution Place 1 drop into both eyes 3 (three) times daily as needed for dry eyes.    . potassium chloride SA (K-DUR,KLOR-CON) 20 MEQ tablet Take 1 tablet (20 mEq total) by mouth daily. 30 tablet 11  . rivaroxaban (XARELTO) 20 MG TABS tablet Take 1 tablet (20 mg total) by mouth daily with supper. 90 tablet 3  . diltiazem (CARDIZEM) 30 MG tablet Take 30 mg by mouth every 4 (four) hours as  needed (for heart rate greater than 100).      No current facility-administered medications for this visit.     Physical Exam: Vitals:   07/07/17 1422  BP: 124/90  Pulse: 69  SpO2: 97%  Weight: 297 lb 3.2 oz (134.8 kg)  Height: 5\' 11"  (1.803 m)    GEN- The patient is well appearing, alert and oriented x 3 today.   Head- normocephalic, atraumatic Eyes-  Sclera clear, conjunctiva pink Ears- hearing intact Oropharynx- clear Lungs- Clear to ausculation bilaterally, normal work of breathing Heart- Regular rate and rhythm,  no murmurs, rubs or gallops, PMI not laterally displaced GI- soft, NT, ND, + BS Extremities- no clubbing, cyanosis, or edema  EKG tracing ordered today is personally reviewed and shows sinus rhythm 69 bpm, PR 216 msec, otherwise normal ekg  Assessment and Plan:  1.  Persistent atrial fibrillation Doing well s/p ablation chads2vasc score is 1 Stop anticoagulation at this time.  If he returns to afib, he will resume xarelto. Continue other medicines Consider stopping flecainide or metoprolol upon return  2. htn He has adrenal venous sampling in the next few weeks Will consider weaning antihypertensives if he has adrenalectomy   Return to see me in 3 months  Thompson Grayer MD, [facility] 07/07/2017 2:33 PM

## 2017-07-09 ENCOUNTER — Telehealth: Payer: Self-pay | Admitting: Endocrinology

## 2017-07-09 NOTE — Telephone Encounter (Signed)
Caryl Pina from [facility] called to advise that the patient was scheduled for a venous sampling back in July and wanted to put it off until Sept. Patient is now scheduled for Sept.19th. Call to discuss further if necessary.

## 2017-07-15 ENCOUNTER — Other Ambulatory Visit: Payer: Self-pay | Admitting: Physician Assistant

## 2017-07-15 ENCOUNTER — Other Ambulatory Visit: Payer: Self-pay | Admitting: Radiology

## 2017-07-16 ENCOUNTER — Other Ambulatory Visit: Payer: Self-pay | Admitting: Endocrinology

## 2017-07-16 ENCOUNTER — Ambulatory Visit (HOSPITAL_COMMUNITY)
Admission: RE | Admit: 2017-07-16 | Discharge: 2017-07-16 | Disposition: A | Discharge status: Home / Self Care | Payer: BLUE CROSS/BLUE SHIELD | Source: Ambulatory Visit | Attending: Endocrinology | Admitting: Endocrinology

## 2017-07-16 ENCOUNTER — Encounter (HOSPITAL_COMMUNITY): Payer: Self-pay | Admitting: Interventional Radiology

## 2017-07-16 DIAGNOSIS — I10 Essential (primary) hypertension: Secondary | ICD-10-CM | POA: Insufficient documentation

## 2017-07-16 DIAGNOSIS — R7989 Other specified abnormal findings of blood chemistry: Secondary | ICD-10-CM

## 2017-07-16 DIAGNOSIS — I481 Persistent atrial fibrillation: Secondary | ICD-10-CM | POA: Insufficient documentation

## 2017-07-16 DIAGNOSIS — Z87891 Personal history of nicotine dependence: Secondary | ICD-10-CM | POA: Insufficient documentation

## 2017-07-16 DIAGNOSIS — M1990 Unspecified osteoarthritis, unspecified site: Secondary | ICD-10-CM | POA: Diagnosis not present

## 2017-07-16 DIAGNOSIS — E269 Hyperaldosteronism, unspecified: Secondary | ICD-10-CM | POA: Diagnosis not present

## 2017-07-16 DIAGNOSIS — G43909 Migraine, unspecified, not intractable, without status migrainosus: Secondary | ICD-10-CM | POA: Diagnosis not present

## 2017-07-16 DIAGNOSIS — K219 Gastro-esophageal reflux disease without esophagitis: Secondary | ICD-10-CM | POA: Insufficient documentation

## 2017-07-16 HISTORY — PX: IR VENOUS SAMPLING: IMG694

## 2017-07-16 HISTORY — PX: IR US GUIDE VASC ACCESS RIGHT: IMG2390

## 2017-07-16 HISTORY — PX: IR VENOGRAM RENAL UNI LEFT: IMG680

## 2017-07-16 HISTORY — PX: IR VENOGRAM ADRENAL BI: IMG685

## 2017-07-16 LAB — CBC
HCT: 42.3 % (ref 39.0–52.0)
Hemoglobin: 14.7 g/dL (ref 13.0–17.0)
MCH: 30.5 pg (ref 26.0–34.0)
MCHC: 34.8 g/dL (ref 30.0–36.0)
MCV: 87.8 fL (ref 78.0–100.0)
PLATELETS: 270 10*3/uL (ref 150–400)
RBC: 4.82 MIL/uL (ref 4.22–5.81)
RDW: 13.1 % (ref 11.5–15.5)
WBC: 5.6 10*3/uL (ref 4.0–10.5)

## 2017-07-16 LAB — CORTISOL
CORTISOL PLASMA: 10.8 ug/dL
CORTISOL PLASMA: 11.1 ug/dL
CORTISOL PLASMA: 17.6 ug/dL
CORTISOL PLASMA: 18.4 ug/dL
CORTISOL PLASMA: 23.9 ug/dL
CORTISOL PLASMA: 6.9 ug/dL
Cortisol, Plasma: 10.2 ug/dL
Cortisol, Plasma: 14.9 ug/dL

## 2017-07-16 LAB — PROTIME-INR
INR: 0.9
PROTHROMBIN TIME: 12 s (ref 11.4–15.2)

## 2017-07-16 LAB — APTT: APTT: 30 s (ref 24–36)

## 2017-07-16 MED ORDER — FENTANYL CITRATE (PF) 100 MCG/2ML IJ SOLN
INTRAMUSCULAR | Status: AC | PRN
  Administered 2017-07-16: 25 ug via INTRAVENOUS
  Administered 2017-07-16: 50 ug via INTRAVENOUS
  Administered 2017-07-16: 25 ug via INTRAVENOUS
  Administered 2017-07-16 (×3): 50 ug via INTRAVENOUS
  Administered 2017-07-16: 25 ug via INTRAVENOUS

## 2017-07-16 MED ORDER — FENTANYL CITRATE (PF) 100 MCG/2ML IJ SOLN
INTRAMUSCULAR | Status: AC
  Filled 2017-07-16: qty 4

## 2017-07-16 MED ORDER — LIDOCAINE HCL (PF) 1 % IJ SOLN
INTRAMUSCULAR | Status: AC | PRN
  Administered 2017-07-16: 15 mL

## 2017-07-16 MED ORDER — MIDAZOLAM HCL 2 MG/2ML IJ SOLN
INTRAMUSCULAR | Status: AC | PRN
  Administered 2017-07-16 (×4): 0.5 mg via INTRAVENOUS
  Administered 2017-07-16 (×2): 1 mg via INTRAVENOUS
  Administered 2017-07-16: 0.5 mg via INTRAVENOUS
  Administered 2017-07-16: 1 mg via INTRAVENOUS

## 2017-07-16 MED ORDER — HYDROCODONE-ACETAMINOPHEN 5-325 MG PO TABS: 1.0000 | ORAL_TABLET | ORAL | Status: DC | PRN

## 2017-07-16 MED ORDER — FENTANYL CITRATE (PF) 100 MCG/2ML IJ SOLN
INTRAMUSCULAR | Status: AC
  Filled 2017-07-16: qty 2

## 2017-07-16 MED ORDER — IOPAMIDOL (ISOVUE-300) INJECTION 61%
INTRAVENOUS | Status: AC
  Administered 2017-07-16: 20 mL
  Filled 2017-07-16: qty 150

## 2017-07-16 MED ORDER — SODIUM CHLORIDE 0.9 % IV SOLN: INTRAVENOUS | Status: DC

## 2017-07-16 MED ORDER — LIDOCAINE HCL (PF) 1 % IJ SOLN
INTRAMUSCULAR | Status: AC
  Filled 2017-07-16: qty 30

## 2017-07-16 MED ORDER — COSYNTROPIN 0.25 MG IJ SOLR
250.0000 ug | Freq: Once | INTRAMUSCULAR | Status: AC
  Administered 2017-07-16: 0.25 mg via INTRAVENOUS
  Filled 2017-07-16: qty 0.25

## 2017-07-16 MED ORDER — IOPAMIDOL (ISOVUE-300) INJECTION 61%
INTRAVENOUS | Status: AC
  Administered 2017-07-16: 150 mL
  Filled 2017-07-16: qty 100

## 2017-07-16 MED ORDER — MIDAZOLAM HCL 2 MG/2ML IJ SOLN
INTRAMUSCULAR | Status: AC
  Filled 2017-07-16: qty 2

## 2017-07-16 MED ORDER — MIDAZOLAM HCL 2 MG/2ML IJ SOLN
INTRAMUSCULAR | Status: AC
  Filled 2017-07-16: qty 4

## 2017-07-16 MED ORDER — SODIUM CHLORIDE 0.9 % IV SOLN
INTRAVENOUS | Status: DC
  Administered 2017-07-16: 09:00:00 via INTRAVENOUS

## 2017-07-16 NOTE — Sedation Documentation (Signed)
Patient is resting comfortably. 

## 2017-07-16 NOTE — Sedation Documentation (Signed)
States bilateral arm pain achy 3/10.

## 2017-07-16 NOTE — Sedation Documentation (Signed)
Patient stated to Doctor and nurse bilateral arm pain.  Told nurse pain 4/10 sore able to move arms equally. States "just sore"

## 2017-07-16 NOTE — Discharge Instructions (Addendum)
Femoral Site Care °Refer to this sheet in the next few weeks. These instructions provide you with information about caring for yourself after your procedure. Your health care provider may also give you more specific instructions. Your treatment has been planned according to current medical practices, but problems sometimes occur. Call your health care provider if you have any problems or questions after your procedure. °What can I expect after the procedure? °After your procedure, it is typical to have the following: °· Bruising at the site that usually fades within 1-2 weeks. °· Blood collecting in the tissue (hematoma) that may be painful to the touch. It should usually decrease in size and tenderness within 1-2 weeks. ° °Follow these instructions at home: °· Take medicines only as directed by your health care provider. °· You may shower 24-48 hours after the procedure or as directed by your health care provider. Remove the bandage (dressing) and gently wash the site with plain soap and water. Pat the area dry with a clean towel. Do not rub the site, because this may cause bleeding. °· Do not take baths, swim, or use a hot tub until your health care provider approves. °· Check your insertion site every day for redness, swelling, or drainage. °· Do not apply powder or lotion to the site. °· Limit use of stairs to twice a day for the first 2-3 days or as directed by your health care provider. °· Do not squat for the first 2-3 days or as directed by your health care provider. °· Do not lift over 10 lb (4.5 kg) for 5 days after your procedure or as directed by your health care provider. °· Ask your health care provider when it is okay to: °? Return to work or school. °? Resume usual physical activities or sports. °? Resume sexual activity. °· Do not drive home if you are discharged the same day as the procedure. Have someone else drive you. °· You may drive 24 hours after the procedure unless otherwise instructed by  your health care provider. °· Do not operate machinery or power tools for 24 hours after the procedure or as directed by your health care provider. °· If your procedure was done as an outpatient procedure, which means that you went home the same day as your procedure, a responsible adult should be with you for the first 24 hours after you arrive home. °· Keep all follow-up visits as directed by your health care provider. This is important. °Contact a health care provider if: °· You have a fever. °· You have chills. °· You have increased bleeding from the site. Hold pressure on the site. °Get help right away if: °· You have unusual pain at the site. °· You have redness, warmth, or swelling at the site. °· You have drainage (other than a small amount of blood on the dressing) from the site. °· The site is bleeding, and the bleeding does not stop after 30 minutes of holding steady pressure on the site. °· Your leg or foot becomes pale, cool, tingly, or numb. °This information is not intended to replace advice given to you by your health care provider. Make sure you discuss any questions you have with your health care provider. °Document Released: 06/17/2014 Document Revised: 03/21/2016 Document Reviewed: 05/03/2014 °Elsevier Interactive Patient Education © 2018 [employer] °Moderate Conscious Sedation, Adult, Care After °These instructions provide you with information about caring for yourself after your procedure. Your health care provider may also give you more   specific instructions. Your treatment has been planned according to current medical practices, but problems sometimes occur. Call your health care provider if you have any problems or questions after your procedure. °What can I expect after the procedure? °After your procedure, it is common: °· To feel sleepy for several hours. °· To feel clumsy and have poor balance for several hours. °· To have poor judgment for several hours. °· To vomit if you eat too  soon. ° °Follow these instructions at home: °For at least 24 hours after the procedure: ° °· Do not: °? Participate in activities where you could fall or become injured. °? Drive. °? Use heavy machinery. °? Drink alcohol. °? Take sleeping pills or medicines that cause drowsiness. °? Make important decisions or sign legal documents. °? Take care of children on your own. °· Rest. °Eating and drinking °· Follow the diet recommended by your health care provider. °· If you vomit: °? Drink water, juice, or soup when you can drink without vomiting. °? Make sure you have little or no nausea before eating solid foods. °General instructions °· Have a responsible adult stay with you until you are awake and alert. °· Take over-the-counter and prescription medicines only as told by your health care provider. °· If you smoke, do not smoke without supervision. °· Keep all follow-up visits as told by your health care provider. This is important. °Contact a health care provider if: °· You keep feeling nauseous or you keep vomiting. °· You feel light-headed. °· You develop a rash. °· You have a fever. °Get help right away if: °· You have trouble breathing. °This information is not intended to replace advice given to you by your health care provider. Make sure you discuss any questions you have with your health care provider. °Document Released: 08/04/2013 Document Revised: 03/18/2016 Document Reviewed: 02/03/2016 °Elsevier Interactive Patient Education © 2018 [employer] ° °

## 2017-07-16 NOTE — Procedures (Signed)
Interventional Radiology Procedure Note  Procedure: Left renal and bilateral adrenal venography; bilateral adrenal venous sampling including pre- and post- cosyntropin stimulation  Complications: None  Estimated Blood Loss: < 10 mL  Successful bilateral adrenal vein sampling.  See dictated report.  Results to follow. 3 hour bedrest today.  Venetia Night. Kathlene Cote, M.D Pager:  [phone]

## 2017-07-16 NOTE — H&P (Signed)
Chief Complaint: Patient was seen in consultation today for elevated aldosterone  Referring Physician(s): Ellison,Sean  History of Present Illness: Gregory Barrett is a 46 y.o. male with past medical history of arthritis, GERD, HTN, a fib, and hyperadolosteronism who presents today for adrenal vein sampling at the request of Dr. Loanne Drilling.   Patient presents today in his usual state of health.  He has been NPO.  He does not take blood thinners.   Past Medical History:  Diagnosis Date  . Arthritis    legs, back   . GERD (gastroesophageal reflux disease)   . Hyperaldosteronism ([place])   . Hypertension   . Melanoma of lower back ([place]) 2000s  . Migraine   . Persistent atrial fibrillation ([place])   . Pneumonia    "a few times; last time ~ 2010" (04/03/2017)    Past Surgical History:  Procedure Laterality Date  . ATRIAL FIBRILLATION ABLATION N/A 04/03/2017   Procedure: Atrial Fibrillation Ablation;  Surgeon: Thompson Grayer, MD;  Location: [facility];  Service: Cardiovascular;  Laterality: N/A;  . CARDIOVERSION N/A 09/13/2016   Procedure: CARDIOVERSION;  Surgeon: Fay Records, MD;  Location: [place] [facility];  Service: Cardiovascular;  Laterality: N/A;  . CARDIOVERSION N/A 10/02/2016   Procedure: CARDIOVERSION;  Surgeon: Larey Dresser, MD;  Location: [place];  Service: Cardiovascular;  Laterality: N/A;  . CARDIOVERSION N/A 04/10/2017   Procedure: CARDIOVERSION;  Surgeon: Fay Records, MD;  Location: [place];  Service: Cardiovascular;  Laterality: N/A;  . CARDIOVERSION N/A 04/28/2017   Procedure: CARDIOVERSION;  Surgeon: Pixie Casino, MD;  Location: [place];  Service: Cardiovascular;  Laterality: N/A;  . HARDWARE REMOVAL Right 12/12/2015   Procedure: HARDWARE REMOVAL RIGHT TIBIAL;  Surgeon: Altamese San Pedro, MD;  Location: [facility];  Service: Orthopedics;  Laterality: Right;  . JOINT REPLACEMENT    . MELANOMA EXCISION     "back"  . ORIF NONUNION / MALUNION TIBIAL  FRACTURE Right 2005   "put plates & screws in"  . TOTAL KNEE ARTHROPLASTY Right 03/19/2016   Procedure: RIGHT TOTAL KNEE ARTHROPLASTY;  Surgeon: Paralee Cancel, MD;  Location: [facility];  Service: Orthopedics;  Laterality: Right;  Marland Kitchen VASECTOMY      Allergies: Patient has no known allergies.  Medications: Prior to Admission medications   Medication Sig Start Date End Date Taking? Authorizing Provider  ALPRAZolam Duanne Moron) 0.25 MG tablet TAKE 1 TABLET BY MOUTH TWICE A DAY AS NEEDED FOR ANXIETY 05/08/17  Yes Marletta Lor, MD  carvedilol (COREG) 25 MG tablet TAKE 1 TABLET (25 MG TOTAL) BY MOUTH 2 (TWO) TIMES DAILY. 01/31/17 05/08/18 Yes Allred, Jeneen Rinks, MD  diltiazem (CARDIZEM CD) 120 MG 24 hr capsule Take 120 mg by mouth daily. 04/27/17  Yes [provider]  esomeprazole (NEXIUM) 20 MG capsule Take 20 mg by mouth daily.   Yes [provider]  flecainide (TAMBOCOR) 100 MG tablet Take 1 tablet (100 mg total) by mouth 2 (two) times daily. Please keep 07/07/17 for future refill authorization 06/10/17  Yes Allred, Jeneen Rinks, MD  potassium chloride SA (K-DUR,KLOR-CON) 20 MEQ tablet Take 1 tablet (20 mEq total) by mouth daily. 05/02/17  Yes Renato Shin, MD  diltiazem (CARDIZEM) 30 MG tablet Take 30 mg by mouth every 4 (four) hours as needed (for heart rate greater than 100).     [provider]  polyvinyl alcohol (LUBRICANT DROPS) 1.4 % ophthalmic solution Place 1 drop into both eyes 3 (three) times daily as needed for dry  eyes.    [provider]     Family History  Problem Relation Age of Onset  . Hypertension Maternal Grandmother   . Cancer Maternal Grandfather        lung ca  . Adrenal disorder Neg Hx     Social History   Social History  . Marital status: Married    Spouse name: N/A  . Number of children: N/A  . Years of education: N/A   Social History Main Topics  . Smoking status: Former Smoker    Packs/day: 0.50    Years: 2.00    Types: Cigarettes    Quit  date: 09/20/2010  . Smokeless tobacco: Former Systems developer    Types: Chew    Quit date: 11/03/2014  . Alcohol use 1.8 oz/week    3 Glasses of wine per week  . Drug use: No  . Sexual activity: Yes   Other Topics Concern  . Not on file   Social History Narrative  . No narrative on file    Review of Systems  Constitutional: Negative for fatigue and fever.  Respiratory: Negative for cough and shortness of breath.   Cardiovascular: Negative for chest pain.  Musculoskeletal: Negative for back pain.  Psychiatric/Behavioral: Negative for behavioral problems and confusion.    Vital Signs: BP (!) 167/103   Pulse 66   Temp 98.2 F (36.8 C) (Oral)   Resp 18   Ht 5\' 11"  (1.803 m)   Wt 296 lb (134.3 kg)   SpO2 96%   BMI 41.28 kg/m   Physical Exam  Constitutional: He is oriented to person, place, and time. He appears well-developed.  Cardiovascular: Normal rate, regular rhythm and normal heart sounds.   Pulmonary/Chest: Effort normal and breath sounds normal. No respiratory distress.  Abdominal: Soft.  Neurological: He is alert and oriented to person, place, and time.  Skin: Skin is warm and dry.  Psychiatric: He has a normal mood and affect. His behavior is normal. Judgment and thought content normal.  Nursing note and vitals reviewed.   Mallampati Score:  MD Evaluation Airway: WNL Heart: WNL Abdomen: WNL Chest/ Lungs: WNL ASA  Classification: 3 Mallampati/Airway Score: Two  Imaging: No results found.  Labs:  CBC:  Recent Labs  10/01/16 1106 03/20/17 1622 04/08/17 1558 04/16/17 1615  WBC 20.8* 8.6 7.9 12.9*  HGB 16.1 14.3 14.9 14.3  HCT 47.1 41.7 42.2 41.7  PLT 398 259 261 340    COAGS:  Recent Labs  10/01/16 1106  INR 1.3*    BMP:  Recent Labs  02/14/17 1000 03/20/17 1622 04/08/17 1558 04/16/17 1615  NA 141 142 139 141  K 3.9 4.1 3.6 3.4*  CL 106 104 107 107  CO2 25 23 25 25   GLUCOSE 101* 83 96 99  BUN 18 19 23* 21*  CALCIUM 8.8* 8.8 8.8*  8.8*  CREATININE 1.24 1.29* 1.23 1.13  GFRNONAA >60 67 >60 >60  GFRAA >60 78 >60 >60    LIVER FUNCTION TESTS: No results for input(s): BILITOT, AST, ALT, ALKPHOS, PROT, ALBUMIN in the last 8760 hours.  TUMOR MARKERS: No results for input(s): AFPTM, CEA, CA199, CHROMGRNA in the last 8760 hours.  Assessment and Plan: Patient with past medical history of a fib and HTN presents with complaint of persistently elevated aldosterone.  IR consulted for adrenal venous sampling at the request of Dr. Loanne Drilling. Case reviewed by Dr. Kathlene Cote who approves patient for procedure.  Patient presents today in their usual state of health.  He has been NPO and is not currently on blood thinners.  Risks and benefits of the procedure were discussed with the patient including, but not limited to bleeding, infection, vascular injury or contrast induced renal failure. This interventional procedure involves the use of X-rays and because of the nature of the planned procedure, it is possible that we will have prolonged use of X-ray fluoroscopy. All of the patient's questions were answered, patient is agreeable to proceed. Consent signed and in chart.   Thank you for this interesting consult.  I greatly enjoyed meeting BRNADON EOFF and look forward to participating in their care.  A copy of this report was sent to the requesting provider on this date.  Electronically Signed: Docia Barrier 07/16/2017, 8:35 AM   I spent a total of  30 Minutes   in face to face in clinical consultation, greater than 50% of which was counseling/coordinating care for elevated aldosterone

## 2017-07-16 NOTE — Sedation Documentation (Signed)
Patient stated bilateral arms "starting to ache a little" Doctor notified.

## 2017-07-16 NOTE — Sedation Documentation (Signed)
Pedal pulses +2 bilaterally.  

## 2017-07-16 NOTE — Sedation Documentation (Signed)
After transfer to stretcher pain bilateral arms 0/10.

## 2017-07-17 ENCOUNTER — Encounter: Payer: Self-pay | Admitting: Internal Medicine

## 2017-07-17 LAB — CORTISOL

## 2017-07-18 DIAGNOSIS — Z471 Aftercare following joint replacement surgery: Secondary | ICD-10-CM | POA: Diagnosis not present

## 2017-07-18 DIAGNOSIS — Z96651 Presence of right artificial knee joint: Secondary | ICD-10-CM | POA: Diagnosis not present

## 2017-07-20 LAB — ALDOSTERONE
ALDOSTERONE: 3.4 ng/dL (ref 0.0–30.0)
ALDOSTERONE: 47.4 ng/dL — AB (ref 0.0–30.0)
Aldosterone: 12.7 ng/dL (ref 0.0–30.0)
Aldosterone: 10.1 ng/dL (ref 0.0–30.0)
Aldosterone: 10.4 ng/dL (ref 0.0–30.0)
Aldosterone: 18.1 ng/dL (ref 0.0–30.0)
Aldosterone: 51.4 ng/dL — ABNORMAL HIGH (ref 0.0–30.0)
Aldosterone: 9.8 ng/dL (ref 0.0–30.0)

## 2017-07-22 LAB — ALDOSTERONE: ALDOSTERONE: 3293.2 ng/dL — AB (ref 0.0–30.0)

## 2017-07-24 ENCOUNTER — Encounter: Payer: Self-pay | Admitting: Endocrinology

## 2017-07-27 ENCOUNTER — Other Ambulatory Visit: Payer: Self-pay | Admitting: Endocrinology

## 2017-07-29 ENCOUNTER — Other Ambulatory Visit: Payer: Self-pay | Admitting: Endocrinology

## 2017-07-29 ENCOUNTER — Encounter (HOSPITAL_COMMUNITY): Payer: Self-pay | Admitting: Interventional Radiology

## 2017-07-29 DIAGNOSIS — R7989 Other specified abnormal findings of blood chemistry: Secondary | ICD-10-CM

## 2017-07-31 ENCOUNTER — Other Ambulatory Visit: Payer: Self-pay | Admitting: Internal Medicine

## 2017-07-31 ENCOUNTER — Encounter: Payer: Self-pay | Admitting: Internal Medicine

## 2017-08-01 ENCOUNTER — Other Ambulatory Visit: Payer: Self-pay | Admitting: Internal Medicine

## 2017-08-05 ENCOUNTER — Encounter: Payer: Self-pay | Admitting: Internal Medicine

## 2017-08-06 ENCOUNTER — Other Ambulatory Visit: Payer: Self-pay | Admitting: Internal Medicine

## 2017-08-27 ENCOUNTER — Ambulatory Visit: Payer: Self-pay | Admitting: Surgery

## 2017-08-27 DIAGNOSIS — E2609 Other primary hyperaldosteronism: Secondary | ICD-10-CM | POA: Diagnosis not present

## 2017-09-10 ENCOUNTER — Other Ambulatory Visit: Payer: Self-pay

## 2017-09-10 ENCOUNTER — Encounter (HOSPITAL_COMMUNITY)
Admission: RE | Admit: 2017-09-10 | Discharge: 2017-09-10 | Disposition: A | Discharge status: Home / Self Care | Payer: BLUE CROSS/BLUE SHIELD | Source: Ambulatory Visit | Attending: Surgery | Admitting: Surgery

## 2017-09-10 ENCOUNTER — Other Ambulatory Visit (HOSPITAL_COMMUNITY): Payer: Self-pay | Admitting: *Deleted

## 2017-09-10 ENCOUNTER — Encounter (HOSPITAL_COMMUNITY): Payer: Self-pay

## 2017-09-10 DIAGNOSIS — I10 Essential (primary) hypertension: Secondary | ICD-10-CM | POA: Diagnosis not present

## 2017-09-10 DIAGNOSIS — Z96651 Presence of right artificial knee joint: Secondary | ICD-10-CM | POA: Diagnosis not present

## 2017-09-10 DIAGNOSIS — Z87891 Personal history of nicotine dependence: Secondary | ICD-10-CM | POA: Insufficient documentation

## 2017-09-10 DIAGNOSIS — I481 Persistent atrial fibrillation: Secondary | ICD-10-CM | POA: Insufficient documentation

## 2017-09-10 DIAGNOSIS — C439 Malignant melanoma of skin, unspecified: Secondary | ICD-10-CM | POA: Diagnosis not present

## 2017-09-10 DIAGNOSIS — Z79899 Other long term (current) drug therapy: Secondary | ICD-10-CM | POA: Insufficient documentation

## 2017-09-10 DIAGNOSIS — Z01812 Encounter for preprocedural laboratory examination: Secondary | ICD-10-CM | POA: Insufficient documentation

## 2017-09-10 DIAGNOSIS — K219 Gastro-esophageal reflux disease without esophagitis: Secondary | ICD-10-CM | POA: Insufficient documentation

## 2017-09-10 DIAGNOSIS — Z9889 Other specified postprocedural states: Secondary | ICD-10-CM | POA: Insufficient documentation

## 2017-09-10 DIAGNOSIS — E269 Hyperaldosteronism, unspecified: Secondary | ICD-10-CM | POA: Diagnosis not present

## 2017-09-10 HISTORY — DX: Anxiety disorder, unspecified: F41.9

## 2017-09-10 LAB — BASIC METABOLIC PANEL
ANION GAP: 8 (ref 5–15)
BUN: 12 mg/dL (ref 6–20)
CO2: 28 mmol/L (ref 22–32)
Calcium: 8.6 mg/dL — ABNORMAL LOW (ref 8.9–10.3)
Chloride: 103 mmol/L (ref 101–111)
Creatinine, Ser: 0.94 mg/dL (ref 0.61–1.24)
Glucose, Bld: 85 mg/dL (ref 65–99)
POTASSIUM: 2.8 mmol/L — AB (ref 3.5–5.1)
SODIUM: 139 mmol/L (ref 135–145)

## 2017-09-10 LAB — CBC
HEMATOCRIT: 42 % (ref 39.0–52.0)
Hemoglobin: 14.9 g/dL (ref 13.0–17.0)
MCH: 31 pg (ref 26.0–34.0)
MCHC: 35.5 g/dL (ref 30.0–36.0)
MCV: 87.5 fL (ref 78.0–100.0)
PLATELETS: 261 10*3/uL (ref 150–400)
RBC: 4.8 MIL/uL (ref 4.22–5.81)
RDW: 13.3 % (ref 11.5–15.5)
WBC: 8.8 10*3/uL (ref 4.0–10.5)

## 2017-09-10 LAB — TYPE AND SCREEN
ABO/RH(D): O POS
ANTIBODY SCREEN: NEGATIVE

## 2017-09-10 NOTE — Pre-Procedure Instructions (Addendum)
Gregory Barrett  09/10/2017    Your procedure is scheduled on Tuesday, September 16, 2017 at 7:30 AM.   Report to [facility] Entrance "A" Admitting Office at 5:30 AM.   Call this number if you have problems the morning of surgery: [phone]   Questions prior to day of surgery, please call [phone] between 8 & 4 PM.   Remember:  Do not eat food or drink liquids after midnight Monday, 09/15/17.  Take these medicines the morning of surgery with A SIP OF WATER: Carvedilol (Coreg), Diltiazem (Cardizem), Esomeprazole (Nexium), Flecainide (Tambocor), Xanax - if needed  Do not use Aspirin products or NSAIDS (Ibuprofen, Aleve, etc) prior to surgery.   Do not wear jewelry.  Do not wear lotions, powders, cologne or deodorant.  Men may shave face and neck.  Do not bring valuables to the hospital.  [facility] is not responsible for any belongings or valuables.  Contacts, dentures or bridgework may not be worn into surgery.  Leave your suitcase in the car.  After surgery it may be brought to your room.  For patients admitted to the hospital, discharge time will be determined by your treatment team.  [facility] - Preparing for Surgery  Before surgery, you can play an important role.  Because skin is not sterile, your skin needs to be as free of germs as possible.  You can reduce the number of germs on you skin by washing with CHG (chlorahexidine gluconate) soap before surgery.  CHG is an antiseptic cleaner which kills germs and bonds with the skin to continue killing germs even after washing.  Please DO NOT use if you have an allergy to CHG or antibacterial soaps.  If your skin becomes reddened/irritated stop using the CHG and inform your nurse when you arrive at Short Stay.  Do not shave (including legs and underarms) for at least 48 hours prior to the first CHG shower.  You may shave your face.  Please follow these instructions carefully:   1.  Shower with CHG Soap the  night before surgery and the                    morning of Surgery.  2.  If you choose to wash your hair, wash your hair first as usual with your       normal shampoo.  3.  After you shampoo, rinse your hair and body thoroughly to remove the shampoo.  4.  Use CHG as you would any other liquid soap.  You can apply chg directly       to the skin and wash gently with scrungie or a clean washcloth.  5.  Apply the CHG Soap to your body ONLY FROM THE NECK DOWN.        Do not use on open wounds or open sores.  Avoid contact with your eyes, ears, mouth and genitals (private parts).  Wash genitals (private parts) with your normal soap.  6.  Wash thoroughly, paying special attention to the area where your surgery        will be performed.  7.  Thoroughly rinse your body with warm water from the neck down.  8.  DO NOT shower/wash with your normal soap after using and rinsing off       the CHG Soap.  9.  Pat yourself dry with a clean towel.            10.  Wear clean pajamas.  11.  Place clean sheets on your bed the night of your first shower and do not        sleep with pets.  Day of Surgery  Do not apply any lotions/deodorants the morning of surgery.  Please wear clean clothes to the hospital.   Please read over the fact sheets that you were given.

## 2017-09-10 NOTE — Progress Notes (Signed)
Pt has hx of persistent A-fib. Pt has had numerous cardioversions and ablations. Pt states that he can feel when he goes into A-fib. States he's not felt it in several months. Last EKG in September did not show A-fib. Heart rate on arrival to this appt was 72. I checked his pulse and it was 70 and regular. Pt denies any other cardiac history. States he is not diabetic.

## 2017-09-11 NOTE — Progress Notes (Addendum)
Anesthesia Chart Review:  Pt is a 46 year old male scheduled for laparoscopic L adrenalectomy on 09/16/2017 with Armandina Gemma, MD  - PCP is Bluford Kaufmann, MD - EP cardiologist is Thompson Grayer, MD, last office visit 07/08/17  PMH includes:  Persistent atrial fibrillation (s/p multiple cardioversions, now in SR), HTN, melanoma, hyperaldosteronism, GERD. Former smoker. BMI 42.5. S/p R TKA 03/19/16  Medications include: Carvedilol, diltiazem, Nexium, flecainide, potassium  BP (!) 141/97   Pulse 70 Comment: regular  Temp 36.7 C   Resp 20   Ht 5\' 11"  (1.803 m)   Wt (!) 305 lb (138.3 kg)   SpO2 98%   BMI 42.54 kg/m   Preoperative labs reviewed.   - K is 2.8. Dr. Harlow Asa has reviewed lab results and will treat hypokalemia. Will recheck K day of surgery.   EKG 07/08/17: Sinus rhythm with first-degree AV block.  TEE 04/03/17:  - Left ventricle: Systolic function was normal. The estimated ejection fraction was in the range of 55% to 60%. Wall motion was normal; there were no regional wall motion abnormalities. - Left atrium: The atrium was dilated. No evidence of thrombus in the atrial cavity or appendage. No evidence of thrombus in the atrial cavity or appendage. - Right atrium: The atrium was dilated. No evidence of thrombus in the atrial cavity or appendage. - Atrial septum: A patent foramen ovale cannot be excluded.  If labs acceptable day of surgery, I anticipate pt can proceed with surgery as scheduled.   Willeen Cass, FNP-BC [facility] Short Stay [facility]/Anesthesiology Phone: [phone] 09/15/2017 12:34 PM

## 2017-09-15 ENCOUNTER — Encounter (HOSPITAL_COMMUNITY): Payer: Self-pay | Admitting: Surgery

## 2017-09-15 DIAGNOSIS — E2609 Other primary hyperaldosteronism: Secondary | ICD-10-CM | POA: Diagnosis present

## 2017-09-15 MED ORDER — DEXTROSE 5 % IV SOLN
3.0000 g | INTRAVENOUS | Status: AC
  Administered 2017-09-16: 3 g via INTRAVENOUS
  Filled 2017-09-15: qty 3000

## 2017-09-15 NOTE — H&P (Signed)
General Surgery [facility]  Gregory Barrett 08/27/2017 9:31 AM Location: [facility] Patient #: [id number] DOB: [birth year] Married / Language: Gregory Barrett / Race: White Male   History of Present Illness Gregory Regal MD; 08/27/2017 10:09 AM) The patient is a 46 year old male who presents wtih an adrenal mass.  CC: hyperaldosteronism (Conn's Syndrome)  Patient is referred by Dr. Renato Barrett for evaluation for surgical management of primary hyperaldosteronism. Patient's primary care physician is Dr. Bluford Barrett. Patient's cardiologist is Dr. Thompson Barrett. Patient has had a long-standing history of hypertension and hypokalemia. He had been noted with elevated aldosterone levels. Patient underwent laboratory testing showing a markedly elevated aldosterone to renin ratio greater than 160. Patient is on potassium supplementation. Recent potassium level remains low at 3.4. Patient had CT scan of the abdomen and pelvis which showed normal bilateral adrenal glands without evidence of neoplasm. Patient then underwent selective venous sampling in September 2018. This lateralized elevated aldosterone and cortisol levels to the left adrenal gland. Patient is now referred for consideration for left adrenalectomy for management of primary hyperaldosteronism. Patient has had no prior abdominal surgery. He does have a significant history of atrial fibrillation. He underwent ablation in July 2018. He is now off of anticoagulation. He would like to proceed with left adrenalectomy in the near future.   Past Surgical History Gregory Barrett, [place]; 08/27/2017 9:31 AM) Knee Surgery  Right. Vasectomy   Diagnostic Studies History Gregory Barrett, [place]; 08/27/2017 9:31 AM) Colonoscopy  never  Allergies Gregory Barrett, [place]; 08/27/2017 9:34 AM) No Known Drug Allergies 08/27/2017 Allergies Reconciled   Medication History Gregory Barrett, [place]; 08/27/2017 9:35  AM) ALPRAZolam (0.25MG  Tablet, Oral) Active. Carvedilol (25MG  Tablet, Oral) Active. DilTIAZem HCl ER Coated Beads (120MG  Capsule ER 24HR, Oral) Active. Flecainide Acetate (100MG  Tablet, Oral) Active. NexIUM (20MG  Capsule DR, Oral) Active. Polyvinyl Alcohol (1.4% Solution, Ophthalmic) Active. Medications Reconciled  Social History Gregory Barrett, [place]; 08/27/2017 9:31 AM) Alcohol use  Occasional alcohol use. Caffeine use  Carbonated beverages. No drug use  Tobacco use  Former smoker.  Family History Gregory Barrett, [place]; 08/27/2017 9:31 AM) Arthritis  Family Members In General, Mother. Cancer  Family Members In General, Father. Respiratory Condition  Family Members In General.  Other Problems Gregory Barrett, [place]; 08/27/2017 9:31 AM) Anxiety Disorder  Arthritis  Atrial Fibrillation  Chest pain  Gastroesophageal Reflux Disease  Hemorrhoids  High blood pressure  Melanoma  Migraine Headache     Review of Systems Gregory Barrett RMA; 08/27/2017 9:31 AM) General Present- Fatigue. Not Present- Appetite Loss, Chills, Fever, Night Sweats, Weight Gain and Weight Loss. Skin Not Present- Change in Wart/Mole, Dryness, Hives, Jaundice, New Lesions, Non-Healing Wounds, Rash and Ulcer. HEENT Present- Sore Throat and Wears glasses/contact lenses. Not Present- Earache, Hearing Loss, Hoarseness, Nose Bleed, Oral Ulcers, Ringing in the Ears, Seasonal Allergies, Sinus Pain, Visual Disturbances and Yellow Eyes. Respiratory Present- Snoring. Not Present- Bloody sputum, Chronic Cough, Difficulty Breathing and Wheezing. Breast Not Present- Breast Mass, Breast Pain, Nipple Discharge and Skin Changes. Cardiovascular Present- Chest Pain, Difficulty Breathing Lying Down, Leg Cramps, Palpitations, Rapid Heart Rate, Shortness of Breath and Swelling of Extremities. Gastrointestinal Present- Indigestion. Not Present- Abdominal Pain, Bloating, Bloody Stool, Change in Bowel Habits, Chronic  diarrhea, Constipation, Difficulty Swallowing, Excessive gas, Gets full quickly at meals, Hemorrhoids, Nausea, Rectal Pain and Vomiting. Male Genitourinary Not Present- Blood in Urine, Change in Urinary Stream, Frequency, Impotence, Nocturia, Painful Urination, Urgency and Urine Leakage. Musculoskeletal Present- Swelling  of Extremities. Not Present- Back Pain, Joint Pain, Joint Stiffness, Muscle Pain and Muscle Weakness. Neurological Present- Headaches. Not Present- Decreased Memory, Fainting, Numbness, Seizures, Tingling, Tremor, Trouble walking and Weakness. Psychiatric Present- Anxiety and Change in Sleep Pattern. Not Present- Bipolar, Depression, Fearful and Frequent crying. Endocrine Not Present- Cold Intolerance, Excessive Hunger, Hair Changes, Heat Intolerance, Hot flashes and New Diabetes. Hematology Present- Gland problems. Not Present- Blood Thinners, Easy Bruising, Excessive bleeding, HIV and Persistent Infections.  Vitals Gregory Barrett RMA; 08/27/2017 9:33 AM) 08/27/2017 9:33 AM Weight: 300.6 lb Height: 73in Body Surface Area: 2.56 m Body Mass Index: 39.66 kg/m  Temp.: 98.81F  Pulse: 87 (Regular)  BP: 140/80 (Sitting, Left Arm, Standard)       Physical Exam Gregory Regal MD; 08/27/2017 10:09 AM) The physical exam findings are as follows: Note:CONSTITUTIONAL See vital signs recorded above  GENERAL APPEARANCE Development: normal Nutritional status: normal Gross deformities: none  SKIN Rash, lesions, ulcers: none Induration, erythema: none Nodules: none palpable  EYES Conjunctiva and lids: normal Pupils: equal and reactive Iris: normal bilaterally  EARS, NOSE, MOUTH, THROAT External ears: no lesion or deformity External nose: no lesion or deformity Hearing: grossly normal Lips: no lesion or deformity Dentition: normal for age Oral mucosa: moist  NECK Symmetric: yes Trachea: midline Thyroid: no palpable nodules in the thyroid  bed  CHEST Respiratory effort: normal Retraction or accessory muscle use: no Breath sounds: normal bilaterally Rales, rhonchi, wheeze: none  CARDIOVASCULAR Auscultation: regular rhythm, normal rate Murmurs: none Pulses: carotid and radial pulse 2+ palpable Lower extremity edema: none Lower extremity varicosities: none  ABDOMEN Distension: none Masses: none palpable Tenderness: none Hepatosplenomegaly: not present Hernia: not present  MUSCULOSKELETAL Station and gait: normal Digits and nails: no clubbing or cyanosis Muscle strength: grossly normal all extremities Range of motion: grossly normal all extremities Deformity: Well-healed surgical incisions right knee and lower extremity  LYMPHATIC Cervical: none palpable Supraclavicular: none palpable  PSYCHIATRIC Oriented to person, place, and time: yes Mood and affect: normal for situation Judgment and insight: appropriate for situation    Assessment & Plan Gregory Regal MD; 08/27/2017 10:11 AM) PRIMARY HYPERALDOSTERONISM (E26.09) Current Plans Patient is referred by his endocrinologist for consideration for left adrenalectomy for management of primary hyperaldosteronism. Patient has undergone diagnostic studies which have lateralized elevated aldosterone and cortisol levels to the left adrenal gland. Patient presents today to discuss surgery.  I have recommended proceeding with laparoscopic left adrenalectomy. We discussed the procedure at length. We discussed the location of the surgical incisions. We discussed the risk and benefits including the risk of significant bleeding. We discussed the possibility of conversion to open surgery. We discussed the hospital stay to be anticipated. We discussed his postoperative recovery and return to work. Patient understands and wishes to proceed with surgery in the near future.  The risks and benefits of the procedure have been discussed at length with the patient. The  patient understands the proposed procedure, potential alternative treatments, and the course of recovery to be expected. All of the patient's questions have been answered at this time. The patient wishes to proceed with surgery.  Armandina Gemma, [place] [facility] Office: [phone]

## 2017-09-15 NOTE — Anesthesia Preprocedure Evaluation (Addendum)
Anesthesia Evaluation  Patient identified by MRN, date of birth, ID band Patient awake    Reviewed: Allergy & Precautions, NPO status , Patient's Chart, lab work & pertinent test results, reviewed documented beta blocker date and time   History of Anesthesia Complications Negative for: history of anesthetic complications  Airway Mallampati: III  TM Distance: >3 FB Neck ROM: Full   Comment: Full beard, large tongue Dental  (+) Teeth Intact, Dental Advisory Given   Pulmonary neg sleep apnea, neg COPD, former smoker,    breath sounds clear to auscultation       Cardiovascular hypertension, Pt. on medications  Rhythm:Regular Rate:Normal  TTE 2018 - mild LVH. Dilated LA and RA. No other abnormalities noted.  S/p ablation for Afib, now in SR   Neuro/Psych  Headaches, Anxiety    GI/Hepatic Neg liver ROS, GERD  Controlled and Medicated,  Endo/Other  neg diabetesMorbid obesityLeft adrenal mass with primary hyperaldosteronism  Renal/GU negative Renal ROS  negative genitourinary   Musculoskeletal  (+) Arthritis ,   Abdominal (+) + obese,   Peds  Hematology negative hematology ROS (+)   Anesthesia Other Findings   Reproductive/Obstetrics                           Anesthesia Physical  Anesthesia Plan  ASA: III  Anesthesia Plan: General   Post-op Pain Management:    Induction: Intravenous  PONV Risk Score and Plan: 4 or greater and Ondansetron, Propofol, Midazolam, Treatment may vary due to age or medical condition and Scopolamine patch - Pre-op  Airway Management Planned: Oral ETT  Additional Equipment: None  Intra-op Plan:   Post-operative Plan: Extubation in OR  Informed Consent: I have reviewed the patients History and Physical, chart, labs and discussed the procedure including the risks, benefits and alternatives for the proposed anesthesia with the patient or authorized representative  who has indicated his/her understanding and acceptance.   Dental advisory given  Plan Discussed with: CRNA  Anesthesia Plan Comments:        Anesthesia Quick Evaluation

## 2017-09-16 ENCOUNTER — Encounter (HOSPITAL_COMMUNITY)
Admission: RE | Disposition: A | Discharge status: Home / Self Care | Payer: Self-pay | Source: Ambulatory Visit | Attending: Surgery

## 2017-09-16 ENCOUNTER — Other Ambulatory Visit: Payer: Self-pay

## 2017-09-16 ENCOUNTER — Inpatient Hospital Stay (HOSPITAL_COMMUNITY): Payer: BLUE CROSS/BLUE SHIELD | Admitting: Emergency Medicine

## 2017-09-16 ENCOUNTER — Encounter (HOSPITAL_COMMUNITY): Payer: Self-pay

## 2017-09-16 ENCOUNTER — Inpatient Hospital Stay (HOSPITAL_COMMUNITY)
Admission: RE | Admit: 2017-09-16 | Discharge: 2017-09-18 | DRG: 615 | Disposition: A | Discharge status: Home / Self Care | Payer: BLUE CROSS/BLUE SHIELD | Source: Ambulatory Visit | Attending: Surgery | Admitting: Surgery

## 2017-09-16 DIAGNOSIS — E876 Hypokalemia: Secondary | ICD-10-CM | POA: Diagnosis present

## 2017-09-16 DIAGNOSIS — I152 Hypertension secondary to endocrine disorders: Secondary | ICD-10-CM | POA: Diagnosis not present

## 2017-09-16 DIAGNOSIS — I4891 Unspecified atrial fibrillation: Secondary | ICD-10-CM | POA: Diagnosis not present

## 2017-09-16 DIAGNOSIS — E2601 Conn's syndrome: Principal | ICD-10-CM | POA: Diagnosis present

## 2017-09-16 DIAGNOSIS — Z85820 Personal history of malignant melanoma of skin: Secondary | ICD-10-CM | POA: Diagnosis not present

## 2017-09-16 DIAGNOSIS — Z96651 Presence of right artificial knee joint: Secondary | ICD-10-CM | POA: Diagnosis present

## 2017-09-16 DIAGNOSIS — K219 Gastro-esophageal reflux disease without esophagitis: Secondary | ICD-10-CM | POA: Diagnosis present

## 2017-09-16 DIAGNOSIS — F419 Anxiety disorder, unspecified: Secondary | ICD-10-CM | POA: Diagnosis not present

## 2017-09-16 DIAGNOSIS — E2609 Other primary hyperaldosteronism: Secondary | ICD-10-CM | POA: Diagnosis not present

## 2017-09-16 DIAGNOSIS — Z87891 Personal history of nicotine dependence: Secondary | ICD-10-CM | POA: Diagnosis not present

## 2017-09-16 DIAGNOSIS — I10 Essential (primary) hypertension: Secondary | ICD-10-CM | POA: Diagnosis not present

## 2017-09-16 DIAGNOSIS — I480 Paroxysmal atrial fibrillation: Secondary | ICD-10-CM | POA: Diagnosis present

## 2017-09-16 DIAGNOSIS — Z79899 Other long term (current) drug therapy: Secondary | ICD-10-CM | POA: Diagnosis not present

## 2017-09-16 DIAGNOSIS — D3502 Benign neoplasm of left adrenal gland: Secondary | ICD-10-CM | POA: Diagnosis not present

## 2017-09-16 DIAGNOSIS — M25561 Pain in right knee: Secondary | ICD-10-CM | POA: Diagnosis not present

## 2017-09-16 HISTORY — PX: LAPAROSCOPIC ADRENALECTOMY: SHX999

## 2017-09-16 HISTORY — PX: LAPAROSCOPIC ADRENALECTOMY: SUR752

## 2017-09-16 LAB — POCT I-STAT 4, (NA,K, GLUC, HGB,HCT)
Glucose, Bld: 99 mg/dL (ref 65–99)
HEMATOCRIT: 39 % (ref 39.0–52.0)
Hemoglobin: 13.3 g/dL (ref 13.0–17.0)
POTASSIUM: 3.2 mmol/L — AB (ref 3.5–5.1)
SODIUM: 145 mmol/L (ref 135–145)

## 2017-09-16 LAB — CBC
HEMATOCRIT: 42.4 % (ref 39.0–52.0)
HEMOGLOBIN: 14.7 g/dL (ref 13.0–17.0)
MCH: 30.8 pg (ref 26.0–34.0)
MCHC: 34.7 g/dL (ref 30.0–36.0)
MCV: 88.9 fL (ref 78.0–100.0)
Platelets: 248 10*3/uL (ref 150–400)
RBC: 4.77 MIL/uL (ref 4.22–5.81)
RDW: 13.4 % (ref 11.5–15.5)
WBC: 12.4 10*3/uL — AB (ref 4.0–10.5)

## 2017-09-16 LAB — BASIC METABOLIC PANEL
ANION GAP: 9 (ref 5–15)
BUN: 14 mg/dL (ref 6–20)
CHLORIDE: 104 mmol/L (ref 101–111)
CO2: 27 mmol/L (ref 22–32)
Calcium: 8.3 mg/dL — ABNORMAL LOW (ref 8.9–10.3)
Creatinine, Ser: 1 mg/dL (ref 0.61–1.24)
GFR calc Af Amer: >60 mL/min (ref >60)
GFR calc non Af Amer: >60 mL/min (ref >60)
Glucose, Bld: 96 mg/dL (ref 65–99)
POTASSIUM: 3.4 mmol/L — AB (ref 3.5–5.1)
SODIUM: 140 mmol/L (ref 135–145)

## 2017-09-16 LAB — CREATININE, SERUM: Creatinine, Ser: 1.17 mg/dL (ref 0.61–1.24)

## 2017-09-16 SURGERY — ADRENALECTOMY, LAPAROSCOPIC
Anesthesia: General | Site: Abdomen | Laterality: Left

## 2017-09-16 MED ORDER — BUPIVACAINE-EPINEPHRINE (PF) 0.25% -1:200000 IJ SOLN
INTRAMUSCULAR | Status: DC | PRN
  Administered 2017-09-16: 30 mL

## 2017-09-16 MED ORDER — PROPOFOL 10 MG/ML IV BOLUS
INTRAVENOUS | Status: DC | PRN
  Administered 2017-09-16: 300 mg via INTRAVENOUS

## 2017-09-16 MED ORDER — FENTANYL CITRATE (PF) 100 MCG/2ML IJ SOLN
25.0000 ug | INTRAMUSCULAR | Status: DC | PRN
  Administered 2017-09-16 (×3): 25 ug via INTRAVENOUS

## 2017-09-16 MED ORDER — ROCURONIUM BROMIDE 10 MG/ML (PF) SYRINGE
PREFILLED_SYRINGE | INTRAVENOUS | Status: AC
  Filled 2017-09-16: qty 5

## 2017-09-16 MED ORDER — FENTANYL CITRATE (PF) 100 MCG/2ML IJ SOLN
INTRAMUSCULAR | Status: DC | PRN
  Administered 2017-09-16: 150 ug via INTRAVENOUS
  Administered 2017-09-16 (×2): 50 ug via INTRAVENOUS

## 2017-09-16 MED ORDER — ACETAMINOPHEN 325 MG PO TABS
650.0000 mg | ORAL_TABLET | Freq: Four times a day (QID) | ORAL | Status: DC | PRN
  Administered 2017-09-17: 650 mg via ORAL
  Filled 2017-09-16: qty 2

## 2017-09-16 MED ORDER — LIDOCAINE 2% (20 MG/ML) 5 ML SYRINGE
INTRAMUSCULAR | Status: AC
  Filled 2017-09-16: qty 5

## 2017-09-16 MED ORDER — HYDROCODONE-ACETAMINOPHEN 5-325 MG PO TABS
ORAL_TABLET | ORAL | Status: AC
  Filled 2017-09-16: qty 2

## 2017-09-16 MED ORDER — ENOXAPARIN SODIUM 40 MG/0.4ML ~~LOC~~ SOLN
40.0000 mg | SUBCUTANEOUS | Status: DC
  Administered 2017-09-17 – 2017-09-18 (×2): 40 mg via SUBCUTANEOUS
  Filled 2017-09-16 (×2): qty 0.4

## 2017-09-16 MED ORDER — HYDROCODONE-ACETAMINOPHEN 5-325 MG PO TABS
1.0000 | ORAL_TABLET | ORAL | Status: DC | PRN
  Administered 2017-09-16 – 2017-09-17 (×4): 2 via ORAL
  Administered 2017-09-17: 1 via ORAL
  Administered 2017-09-17 – 2017-09-18 (×2): 2 via ORAL
  Administered 2017-09-18: 1 via ORAL
  Filled 2017-09-16 (×8): qty 2

## 2017-09-16 MED ORDER — FENTANYL CITRATE (PF) 100 MCG/2ML IJ SOLN
INTRAMUSCULAR | Status: AC
  Filled 2017-09-16: qty 2

## 2017-09-16 MED ORDER — DIPHENHYDRAMINE HCL 25 MG PO CAPS
25.0000 mg | ORAL_CAPSULE | Freq: Four times a day (QID) | ORAL | Status: DC | PRN
  Administered 2017-09-17: 25 mg via ORAL
  Filled 2017-09-16: qty 1

## 2017-09-16 MED ORDER — CHLORHEXIDINE GLUCONATE CLOTH 2 % EX PADS: 6.0000 | MEDICATED_PAD | Freq: Once | CUTANEOUS | Status: DC

## 2017-09-16 MED ORDER — ONDANSETRON HCL 4 MG/2ML IJ SOLN
4.0000 mg | Freq: Four times a day (QID) | INTRAMUSCULAR | Status: DC | PRN
  Filled 2017-09-16: qty 2

## 2017-09-16 MED ORDER — SUGAMMADEX SODIUM 500 MG/5ML IV SOLN
INTRAVENOUS | Status: DC | PRN
  Administered 2017-09-16: 300 mg via INTRAVENOUS

## 2017-09-16 MED ORDER — ROCURONIUM BROMIDE 100 MG/10ML IV SOLN
INTRAVENOUS | Status: DC | PRN
  Administered 2017-09-16 (×2): 10 mg via INTRAVENOUS
  Administered 2017-09-16: 70 mg via INTRAVENOUS
  Administered 2017-09-16: 10 mg via INTRAVENOUS

## 2017-09-16 MED ORDER — MIDAZOLAM HCL 2 MG/2ML IJ SOLN
INTRAMUSCULAR | Status: AC
  Filled 2017-09-16: qty 2

## 2017-09-16 MED ORDER — TRAMADOL HCL 50 MG PO TABS: 50.0000 mg | ORAL_TABLET | Freq: Four times a day (QID) | ORAL | Status: DC | PRN

## 2017-09-16 MED ORDER — SODIUM CHLORIDE 0.9 % IR SOLN
Status: DC | PRN
  Administered 2017-09-16: 1000 mL

## 2017-09-16 MED ORDER — FENTANYL CITRATE (PF) 250 MCG/5ML IJ SOLN
INTRAMUSCULAR | Status: AC
Start: 2017-09-16 — End: 2017-09-16
  Filled 2017-09-16: qty 5

## 2017-09-16 MED ORDER — PROPOFOL 10 MG/ML IV BOLUS
INTRAVENOUS | Status: AC
  Filled 2017-09-16: qty 20

## 2017-09-16 MED ORDER — ACETAMINOPHEN 650 MG RE SUPP: 650.0000 mg | Freq: Four times a day (QID) | RECTAL | Status: DC | PRN

## 2017-09-16 MED ORDER — LIDOCAINE HCL (CARDIAC) 20 MG/ML IV SOLN
INTRAVENOUS | Status: DC | PRN
  Administered 2017-09-16: 60 mg via INTRAVENOUS

## 2017-09-16 MED ORDER — KCL IN DEXTROSE-NACL 30-5-0.45 MEQ/L-%-% IV SOLN
INTRAVENOUS | Status: DC
  Administered 2017-09-16 – 2017-09-17 (×3): via INTRAVENOUS
  Filled 2017-09-16 (×4): qty 1000

## 2017-09-16 MED ORDER — LACTATED RINGERS IV SOLN
INTRAVENOUS | Status: DC | PRN
  Administered 2017-09-16 (×2): via INTRAVENOUS

## 2017-09-16 MED ORDER — HYDROMORPHONE HCL 1 MG/ML IJ SOLN
1.0000 mg | INTRAMUSCULAR | Status: DC | PRN
  Administered 2017-09-16: 2 mg via INTRAVENOUS
  Administered 2017-09-16: 1 mg via INTRAVENOUS
  Administered 2017-09-16: 2 mg via INTRAVENOUS
  Administered 2017-09-17: 1 mg via INTRAVENOUS
  Administered 2017-09-17 (×2): 2 mg via INTRAVENOUS
  Filled 2017-09-16 (×5): qty 2
  Filled 2017-09-16: qty 1

## 2017-09-16 MED ORDER — SUGAMMADEX SODIUM 500 MG/5ML IV SOLN
INTRAVENOUS | Status: AC
Start: 2017-09-16 — End: 2017-09-16
  Filled 2017-09-16: qty 5

## 2017-09-16 MED ORDER — SCOPOLAMINE 1 MG/3DAYS TD PT72
MEDICATED_PATCH | TRANSDERMAL | Status: DC | PRN
  Administered 2017-09-16: 1 via TRANSDERMAL

## 2017-09-16 MED ORDER — PROMETHAZINE HCL 25 MG/ML IJ SOLN: 6.2500 mg | INTRAMUSCULAR | Status: DC | PRN

## 2017-09-16 MED ORDER — EVICEL 5 ML EX KIT
PACK | CUTANEOUS | Status: AC
  Filled 2017-09-16: qty 1

## 2017-09-16 MED ORDER — SCOPOLAMINE 1 MG/3DAYS TD PT72
MEDICATED_PATCH | TRANSDERMAL | Status: AC
  Filled 2017-09-16: qty 2

## 2017-09-16 MED ORDER — ONDANSETRON HCL 4 MG/2ML IJ SOLN
INTRAMUSCULAR | Status: DC | PRN
  Administered 2017-09-16: 4 mg via INTRAVENOUS

## 2017-09-16 MED ORDER — BUPIVACAINE-EPINEPHRINE (PF) 0.25% -1:200000 IJ SOLN
INTRAMUSCULAR | Status: AC
  Filled 2017-09-16: qty 30

## 2017-09-16 MED ORDER — MIDAZOLAM HCL 5 MG/5ML IJ SOLN
INTRAMUSCULAR | Status: DC | PRN
  Administered 2017-09-16: 2 mg via INTRAVENOUS

## 2017-09-16 MED ORDER — CARVEDILOL 12.5 MG PO TABS
12.5000 mg | ORAL_TABLET | Freq: Two times a day (BID) | ORAL | Status: DC
  Administered 2017-09-16 – 2017-09-18 (×4): 12.5 mg via ORAL
  Filled 2017-09-16 (×4): qty 1

## 2017-09-16 MED ORDER — ONDANSETRON 4 MG PO TBDP: 4.0000 mg | ORAL_TABLET | Freq: Four times a day (QID) | ORAL | Status: DC | PRN

## 2017-09-16 SURGICAL SUPPLY — 70 items
APPLIER CLIP 5 13 M/L LIGAMAX5 (MISCELLANEOUS) ×2
APPLIER CLIP ROT 10 11.4 M/L (STAPLE)
BLADE CLIPPER SURG (BLADE) ×2 IMPLANT
CANISTER SUCT 3000ML PPV (MISCELLANEOUS) ×2 IMPLANT
CLIP APPLIE 5 13 M/L LIGAMAX5 (MISCELLANEOUS) ×1 IMPLANT
CLIP APPLIE ROT 10 11.4 M/L (STAPLE) IMPLANT
COVER SURGICAL LIGHT HANDLE (MISCELLANEOUS) ×2 IMPLANT
CUTTER FLEX LINEAR 45M (STAPLE) IMPLANT
CUTTER LINEAR ENDO 35 ETS TH (STAPLE) IMPLANT
ELECT REM PT RETURN 9FT ADLT (ELECTROSURGICAL) ×2
ELECTRODE REM PT RTRN 9FT ADLT (ELECTROSURGICAL) ×1 IMPLANT
GAUZE SPONGE 4X4 12PLY STRL (GAUZE/BANDAGES/DRESSINGS) IMPLANT
GLOVE BIO SURGEON STRL SZ 6.5 (GLOVE) ×4 IMPLANT
GLOVE BIO SURGEON STRL SZ7 (GLOVE) ×2 IMPLANT
GLOVE BIO SURGEON STRL SZ8 (GLOVE) IMPLANT
GLOVE BIOGEL PI IND STRL 6.5 (GLOVE) ×1 IMPLANT
GLOVE BIOGEL PI IND STRL 7.0 (GLOVE) ×2 IMPLANT
GLOVE BIOGEL PI IND STRL 8.5 (GLOVE) ×2 IMPLANT
GLOVE BIOGEL PI INDICATOR 6.5 (GLOVE) ×1
GLOVE BIOGEL PI INDICATOR 7.0 (GLOVE) ×2
GLOVE BIOGEL PI INDICATOR 8.5 (GLOVE) ×2
GLOVE BIOGEL PI ORTHO PRO SZ8 (GLOVE) ×1
GLOVE PI ORTHO PRO STRL SZ8 (GLOVE) ×1 IMPLANT
GOWN STRL REUS W/ TWL LRG LVL3 (GOWN DISPOSABLE) ×5 IMPLANT
GOWN STRL REUS W/ TWL XL LVL3 (GOWN DISPOSABLE) ×1 IMPLANT
GOWN STRL REUS W/TWL 2XL LVL3 (GOWN DISPOSABLE) IMPLANT
GOWN STRL REUS W/TWL LRG LVL3 (GOWN DISPOSABLE) ×5
GOWN STRL REUS W/TWL XL LVL3 (GOWN DISPOSABLE) ×1
KIT BASIN OR (CUSTOM PROCEDURE TRAY) ×2 IMPLANT
KIT ROOM TURNOVER OR (KITS) ×2 IMPLANT
NS IRRIG 1000ML POUR BTL (IV SOLUTION) ×2 IMPLANT
PAD ARMBOARD 7.5X6 YLW CONV (MISCELLANEOUS) ×4 IMPLANT
PENCIL BUTTON HOLSTER BLD 10FT (ELECTRODE) IMPLANT
POUCH SPECIMEN RETRIEVAL 10MM (ENDOMECHANICALS) ×2 IMPLANT
RELOAD CUTTER ETS 35MM STAND (ENDOMECHANICALS) IMPLANT
RELOAD STAPLE TA45 3.5 REG BLU (ENDOMECHANICALS) IMPLANT
SCISSORS LAP 5X35 DISP (ENDOMECHANICALS) ×2 IMPLANT
SET IRRIG TUBING LAPAROSCOPIC (IRRIGATION / IRRIGATOR) ×2 IMPLANT
SHEARS HARMONIC 23CM COAG (MISCELLANEOUS) IMPLANT
SHEARS HARMONIC ACE PLUS 36CM (ENDOMECHANICALS) ×2 IMPLANT
SLEEVE ENDOPATH XCEL 5M (ENDOMECHANICALS) ×4 IMPLANT
SPECIMEN JAR LARGE (MISCELLANEOUS) IMPLANT
SPONGE LAP 18X18 X RAY DECT (DISPOSABLE) IMPLANT
STAPLER VISISTAT 35W (STAPLE) IMPLANT
SUT MNCRL AB 4-0 PS2 18 (SUTURE) ×2 IMPLANT
SUT PDS AB 1 CT  36 (SUTURE)
SUT PDS AB 1 CT 36 (SUTURE) IMPLANT
SUT PDS AB 4-0 SH 27 (SUTURE) IMPLANT
SUT PROLENE 2 0 KS (SUTURE) IMPLANT
SUT SILK 2 0 SH (SUTURE) IMPLANT
SUT SILK 2 0 SH CR/8 (SUTURE) IMPLANT
SUT SILK 2 0 TIES 10X30 (SUTURE) IMPLANT
SUT SILK 3 0 SH CR/8 (SUTURE) IMPLANT
SUT VIC AB 2-0 BRD 54 (SUTURE) IMPLANT
SUT VIC AB 3-0 SH 27 (SUTURE)
SUT VIC AB 3-0 SH 27XBRD (SUTURE) IMPLANT
SUT VIC AB 4-0 SH 18 (SUTURE) IMPLANT
SYR CONTROL 10ML LL (SYRINGE) IMPLANT
TOWEL OR 17X24 6PK STRL BLUE (TOWEL DISPOSABLE) ×2 IMPLANT
TOWEL OR 17X26 10 PK STRL BLUE (TOWEL DISPOSABLE) ×2 IMPLANT
TRAY FOLEY W/METER SILVER 14FR (SET/KITS/TRAYS/PACK) ×2 IMPLANT
TRAY LAPAROSCOPIC MC (CUSTOM PROCEDURE TRAY) ×2 IMPLANT
TROCAR XCEL 12X100 BLDLESS (ENDOMECHANICALS) ×2 IMPLANT
TROCAR XCEL NON-BLD 11X100MML (ENDOMECHANICALS) IMPLANT
TROCAR XCEL NON-BLD 5MMX100MML (ENDOMECHANICALS) ×2 IMPLANT
TUBE CONNECTING 12X1/4 (SUCTIONS) ×2 IMPLANT
TUBING INSUF HEATED (TUBING) ×2 IMPLANT
TUBING INSUFFLATION (TUBING) ×2 IMPLANT
WATER STERILE IRR 1000ML POUR (IV SOLUTION) IMPLANT
YANKAUER SUCT BULB TIP NO VENT (SUCTIONS) IMPLANT

## 2017-09-16 NOTE — Brief Op Note (Signed)
09/16/2017  10:25 AM  PATIENT:  Gregory Barrett  46 y.o. male  PRE-OPERATIVE DIAGNOSIS:  PRIMARY HYPERALDOSTERONISM  POST-OPERATIVE DIAGNOSIS:  PRIMARY HYPERALDOSTERONISM  PROCEDURE:  Procedure(s): LAPAROSCOPIC LEFT ADRENALECTOMY (Left)  SURGEON:  Surgeon(s) and Role:    * Armandina Gemma, MD - Primary    * Clovis Riley, MD - Assisting  ANESTHESIA:   general  EBL:  25 mL   BLOOD ADMINISTERED:none  DRAINS: none   LOCAL MEDICATIONS USED:  MARCAINE     SPECIMEN:  Excision  DISPOSITION OF SPECIMEN:  PATHOLOGY  COUNTS:  YES  TOURNIQUET:  * No tourniquets in log *  DICTATION: .Other Dictation: Dictation Number [id number]  PLAN OF CARE: Admit for overnight observation  PATIENT DISPOSITION:  PACU - hemodynamically stable.   Delay start of Pharmacological VTE agent (>24hrs) due to surgical blood loss or risk of bleeding: yes  Armandina Gemma, MD [facility] Office: [phone]

## 2017-09-16 NOTE — Interval H&P Note (Signed)
History and Physical Interval Note:  09/16/2017 7:06 AM  Gregory Barrett  has presented today for surgery, with the diagnosis of PRIMARY HYPERALDOSTERONISM.  The various methods of treatment have been discussed with the patient and family. After consideration of risks, benefits and other options for treatment, the patient has consented to    Procedure(s): LAPAROSCOPIC LEFT ADRENALECTOMY (Left) as a surgical intervention .    The patient's history has been reviewed, patient examined, no change in status, stable for surgery.  I have reviewed the patient's chart and labs.  Questions were answered to the patient's satisfaction.    Armandina Gemma, [facility] Office: [place]

## 2017-09-16 NOTE — Anesthesia Procedure Notes (Signed)
Procedure Name: Intubation Date/Time: 09/16/2017 7:39 AM Performed by: Susa Loffler, CRNA Pre-anesthesia Checklist: Patient identified, Emergency Drugs available, Suction available and Patient being monitored Patient Re-evaluated:Patient Re-evaluated prior to induction Oxygen Delivery Method: Circle System Utilized Preoxygenation: Pre-oxygenation with 100% oxygen Induction Type: IV induction Ventilation: Two handed mask ventilation required and Oral airway inserted - appropriate to patient size Laryngoscope Size: 4 and Glidescope Grade View: Grade I Tube type: Oral Laser Tube: Cuffed inflated with minimal occlusive pressure - saline Tube size: 7.0 mm Number of attempts: 1 Airway Equipment and Method: Stylet,  Oral airway and Video-laryngoscopy Placement Confirmation: ETT inserted through vocal cords under direct vision,  positive ETCO2 and breath sounds checked- equal and bilateral Secured at: 23 cm Tube secured with: Tape Dental Injury: Teeth and Oropharynx as per pre-operative assessment  Difficulty Due To: Difficulty was anticipated and Difficult Airway- due to large tongue Comments: Performed by [place] under direct supervision

## 2017-09-16 NOTE — Progress Notes (Signed)
CARDIOLOGY CONSULT NOTE     Primary Care Physician: Marletta Lor, MD Referring Physician:  Dr Harlow Asa  Admit Date: 09/16/2017  Reason for consultation:  Hypertension management  Gregory Barrett is a 46 y.o. male with a h/o persistent atrial fibrillation s/p AF ablation 6/18 now admitted with for adrenalectomy as definitive treatment for primary hyperaldosteronism.  Cardiology is consulted for hypertension management. The patient is well known to me.  Afib has been well controlled.  He remains active preoperatively without cardiac symptoms.  Post operatively, he is resting.  He reports moderate post operative pain and discomfort.  In sinus rhythm.  No CV complaints.  Today, he denies symptoms of palpitations, chest pain, shortness of breath, lower extremity edema, dizziness, presyncope, syncope, or neurologic sequela. The patient is tolerating medications without difficulties and is otherwise without complaint today.   Past Medical History:  Diagnosis Date  . Anxiety    when he was in A-fib, attributed to adrenal gland and fatigue  . Arthritis    legs, ankles  . GERD (gastroesophageal reflux disease)   . Hyperaldosteronism ([place])   . Hypertension   . Melanoma of lower back ([place]) 2000s  . Migraine    none for years  . Persistent atrial fibrillation ([place])   . Pneumonia    "a few times; last time ~ 2010" (04/03/2017)   Past Surgical History:  Procedure Laterality Date  . ATRIAL FIBRILLATION ABLATION N/A 04/03/2017   Procedure: Atrial Fibrillation Ablation;  Surgeon: Thompson Grayer, MD;  Location: [facility];  Service: Cardiovascular;  Laterality: N/A;  . CARDIOVERSION N/A 09/13/2016   Procedure: CARDIOVERSION;  Surgeon: Fay Records, MD;  Location: [facility];  Service: Cardiovascular;  Laterality: N/A;  . CARDIOVERSION N/A 10/02/2016   Procedure: CARDIOVERSION;  Surgeon: Larey Dresser, MD;  Location: [facility];  Service: Cardiovascular;  Laterality: N/A;  .  CARDIOVERSION N/A 04/10/2017   Procedure: CARDIOVERSION;  Surgeon: Fay Records, MD;  Location: [facility];  Service: Cardiovascular;  Laterality: N/A;  . CARDIOVERSION N/A 04/28/2017   Procedure: CARDIOVERSION;  Surgeon: Pixie Casino, MD;  Location: [facility];  Service: Cardiovascular;  Laterality: N/A;  . HARDWARE REMOVAL Right 12/12/2015   Procedure: HARDWARE REMOVAL RIGHT TIBIAL;  Surgeon: Altamese [place], MD;  Location: [facility];  Service: Orthopedics;  Laterality: Right;  . IR US GUIDE VASC ACCESS RIGHT  07/16/2017  . IR US GUIDE VASC ACCESS RIGHT  07/16/2017  . IR VENOGRAM ADRENAL BI  07/16/2017  . IR VENOGRAM RENAL UNI LEFT  07/16/2017  . IR VENOUS SAMPLING  07/16/2017  . IR VENOUS SAMPLING  07/16/2017  . JOINT REPLACEMENT    . LAPAROSCOPIC ADRENALECTOMY Left 09/16/2017  . MELANOMA EXCISION     "back"  . ORIF NONUNION / MALUNION TIBIAL FRACTURE Right 2005   "put plates & screws in"  . TOTAL KNEE ARTHROPLASTY Right 03/19/2016   Procedure: RIGHT TOTAL KNEE ARTHROPLASTY;  Surgeon: Paralee Cancel, MD;  Location: [facility];  Service: Orthopedics;  Laterality: Right;  Marland Kitchen VASECTOMY      . carvedilol  12.5 mg Oral BID WC  . [START ON 09/17/2017] enoxaparin (LOVENOX) injection  40 mg Subcutaneous Q24H  . fentaNYL      . HYDROcodone-acetaminophen       . dexrose 5 % and 0.45 % NaCl with KCl 30 mEq/L 100 mL/hr at 09/16/17 1309    No Known Allergies  Social History   Socioeconomic History  . Marital status: Married  Spouse name: Not on file  . Number of children: Not on file  . Years of education: Not on file  . Highest education level: Not on file  Social Needs  . Financial resource strain: Not on file  . Food insecurity - worry: Not on file  . Food insecurity - inability: Not on file  . Transportation needs - medical: Not on file  . Transportation needs - non-medical: Not on file  Occupational History  . Not on file  Tobacco Use  . Smoking status: Former Smoker    Packs/day:  0.50    Years: 2.00    Pack years: 1.00    Types: Cigarettes    Last attempt to quit: 09/20/2010    Years since quitting: 6.9  . Smokeless tobacco: Former Systems developer    Types: [place] date: 11/03/2014  Substance and Sexual Activity  . Alcohol use: Yes    Alcohol/week: 1.8 oz    Types: 3 Glasses of wine per week  . Drug use: No  . Sexual activity: Yes  Other Topics Concern  . Not on file  Social History Narrative  . Not on file    Family History  Problem Relation Age of Onset  . Meniere's disease Mother   . Bladder Cancer Father   . Hypertension Maternal Grandmother   . Cancer Maternal Grandfather        lung ca  . Adrenal disorder Neg Hx     ROS- All systems are reviewed and negative except as per the HPI above  Physical Exam: Telemetry: Vitals:   09/16/17 1107 09/16/17 1122 09/16/17 1140 09/16/17 1524  BP: (!) 142/88 (!) 148/88 (!) 137/95   Pulse: (!) 57 (!) 57 61   Resp: 14 15 16    Temp:  (!) 97.5 F (36.4 C) 97.9 F (36.6 C)   TempSrc:   Oral   SpO2: 99% 99% 96%   Weight:    (!) 305 lb (138.3 kg)  Height:    5\' 11"  (1.803 m)    GEN- The patient is well appearing, alert and oriented x 3 today.  In moderate distress due to pain. Head- normocephalic, atraumatic Eyes-  Sclera clear, conjunctiva pink Ears- hearing intact Oropharynx- clear Neck- supple,  Lungs- Clear to ausculation bilaterally, normal work of breathing Heart- Regular rate and rhythm, no murmurs, rubs or gallops, PMI not laterally displaced GI- soft, NT, ND, + BS Extremities- no clubbing, cyanosis, or edema MS- no significant deformity or atrophy Skin- no rash or lesion Psych- euthymic mood, full affect Neuro- strength and sensation are intact  Labs:   Lab Results  Component Value Date   WBC 12.4 (H) 09/16/2017   HGB 14.7 09/16/2017   HCT 42.4 09/16/2017   MCV 88.9 09/16/2017   PLT 248 09/16/2017    Recent Labs  Lab 09/10/17 1159 09/16/17 0646 09/16/17 1134  NA 139 145  --   K  2.8* 3.2*  --   CL 103  --   --   CO2 28  --   --   BUN 12  --   --   CREATININE 0.94  --  1.17  CALCIUM 8.6*  --   --   GLUCOSE 85 99  --    No results found for: CKTOTAL, CKMB, CKMBINDEX, TROPONINI  Lab Results  Component Value Date   CHOL 168 07/01/2014   Lab Results  Component Value Date   HDL 38.00 (L) 07/01/2014   Lab Results  Component Value Date   LDLCALC 115 (H) 07/01/2014   Lab Results  Component Value Date   TRIG 77.0 07/01/2014   Lab Results  Component Value Date   CHOLHDL 4 07/01/2014   No results found for: LDLDIRECT     Echo:  EF 55-60%, dilated LA, no significant valvular disease  ASSESSMENT AND PLAN:   1. Hypertension secondary to hyperaldosteronism BP currently elevated, though he is in pain.   I will restart coreg at 12.5mg  BID and follow.    2. Paroxysmal atrial fibrillation Maintaining sinus rhythm Keep off of flecainide for now Keep off of anticoagulation (chads2vasc score is 1)  3. Hypokalemia Hopefully will improve with adrenalectomy Primary team to replace K in the interim.  I am happy to follow with you while here Please call with questions  Thompson Grayer, MD 09/16/2017  3:31 PM

## 2017-09-16 NOTE — Transfer of Care (Signed)
Immediate Anesthesia Transfer of Care Note  Patient: Gregory Barrett  Procedure(s) Performed: LAPAROSCOPIC LEFT ADRENALECTOMY (Left Abdomen)  Patient Location: PACU  Anesthesia Type:General  Level of Consciousness: awake and drowsy  Airway & Oxygen Therapy: Patient Spontanous Breathing and Patient connected to nasal cannula oxygen  Post-op Assessment: Report given to RN, Post -op Vital signs reviewed and stable and Patient moving all extremities X 4  Post vital signs: Reviewed and stable  Last Vitals:  Vitals:   09/16/17 0551 09/16/17 0625  BP: (!) 160/106 (!) 156/99  Pulse: 74   Resp: 18   Temp: 36.7 C   SpO2: 97%     Last Pain:  Vitals:   09/16/17 0551  TempSrc: Oral         Complications: No apparent anesthesia complications

## 2017-09-16 NOTE — Anesthesia Postprocedure Evaluation (Signed)
Anesthesia Post Note  Patient: Gregory Barrett  Procedure(s) Performed: LAPAROSCOPIC LEFT ADRENALECTOMY (Left Abdomen)     Patient location during evaluation: PACU Anesthesia Type: General Level of consciousness: awake and alert Pain management: pain level controlled Vital Signs Assessment: post-procedure vital signs reviewed and stable Respiratory status: spontaneous breathing, nonlabored ventilation and respiratory function stable Cardiovascular status: blood pressure returned to baseline and stable Postop Assessment: no apparent nausea or vomiting Anesthetic complications: no    Last Vitals:  Vitals:   09/16/17 1107 09/16/17 1122  BP: (!) 142/88 (!) 148/88  Pulse: (!) 57 (!) 57  Resp: 14 15  Temp:  (!) 36.4 C  SpO2: 99% 99%    Last Pain:  Vitals:   09/16/17 1122  TempSrc:   PainSc: [place]

## 2017-09-17 ENCOUNTER — Encounter (HOSPITAL_COMMUNITY): Payer: Self-pay | Admitting: Surgery

## 2017-09-17 LAB — CBC
HEMATOCRIT: 42.3 % (ref 39.0–52.0)
HEMOGLOBIN: 14.6 g/dL (ref 13.0–17.0)
MCH: 31.1 pg (ref 26.0–34.0)
MCHC: 34.5 g/dL (ref 30.0–36.0)
MCV: 90 fL (ref 78.0–100.0)
Platelets: 233 10*3/uL (ref 150–400)
RBC: 4.7 MIL/uL (ref 4.22–5.81)
RDW: 13.4 % (ref 11.5–15.5)
WBC: 10 10*3/uL (ref 4.0–10.5)

## 2017-09-17 LAB — BASIC METABOLIC PANEL
ANION GAP: 8 (ref 5–15)
BUN: 10 mg/dL (ref 6–20)
CHLORIDE: 102 mmol/L (ref 101–111)
CO2: 28 mmol/L (ref 22–32)
Calcium: 8.4 mg/dL — ABNORMAL LOW (ref 8.9–10.3)
Creatinine, Ser: 1 mg/dL (ref 0.61–1.24)
GFR calc non Af Amer: >60 mL/min (ref >60)
Glucose, Bld: 100 mg/dL — ABNORMAL HIGH (ref 65–99)
Potassium: 3.6 mmol/L (ref 3.5–5.1)
Sodium: 138 mmol/L (ref 135–145)

## 2017-09-17 MED ORDER — PANTOPRAZOLE SODIUM 40 MG PO TBEC
40.0000 mg | DELAYED_RELEASE_TABLET | Freq: Every day | ORAL | Status: DC
  Administered 2017-09-17: 40 mg via ORAL
  Filled 2017-09-17 (×2): qty 1

## 2017-09-17 MED ORDER — FLECAINIDE ACETATE 50 MG PO TABS
50.0000 mg | ORAL_TABLET | Freq: Two times a day (BID) | ORAL | Status: DC
  Administered 2017-09-17 – 2017-09-18 (×3): 50 mg via ORAL
  Filled 2017-09-17 (×3): qty 1

## 2017-09-17 MED ORDER — HYDROCODONE-ACETAMINOPHEN 5-325 MG PO TABS: 1.0000 | ORAL_TABLET | ORAL | 0 refills | Status: DC | PRN

## 2017-09-17 NOTE — Op Note (Signed)
[name], [name] NO.:  MEDICAL RECORD NO.:  [id number]  LOCATION:                                 FACILITY:  PHYSICIAN:  [name], MD      DATE OF BIRTH:  [birth year]  DATE OF PROCEDURE:  09/16/2017                              OPERATIVE REPORT   PREOPERATIVE DIAGNOSIS:  Primary hyperaldosteronism.  POSTOPERATIVE DIAGNOSIS:  Primary hyperaldosteronism.  PROCEDURE:  Laparoscopic left adrenalectomy.  SURGEON:  Armandina Gemma, MD  ASSISTANT:  Clovis Riley, MD  ANESTHESIA:  General.  ESTIMATED BLOOD LOSS:  Minimal.  PREPARATION:  ChloraPrep.  COMPLICATIONS:  None.  INDICATIONS:  The patient is a 46 year old male, referred by his endocrinologist, Dr. Renato Shin, for surgical management of primary hyperaldosteronism.  The patient's primary care physician is Dr. Bluford Kaufmann.  The patient has a longstanding history of hypertension and hypokalemia.  He was noted to have elevated aldosterone levels.  He underwent laboratory testing, showing an elevated aldosterone to renin level greater than 160.  The patient has been on potassium supplementation.  CT scan of the abdomen and pelvis showed bilateral normal adrenal glands without evidence of neoplasm.  Selective venous sampling was performed in September 2018.  This lateralized an elevated aldosterone and cortisol level to the left adrenal gland.  The patient now comes to Surgery for left adrenalectomy for management of primary hyperaldosteronism.  BODY OF REPORT:  Procedure was done in OR #2 at the [facility].  The patient was brought to the operating room, placed in supine position on the operating room table.  Following administration of general anesthesia, the patient was turned to a right lateral decubitus position on the bean bag and positioned.  After complete preparation, the patient was prepped and then draped in the usual aseptic fashion.  After  ascertaining that an adequate level of anesthesia had been achieved, an incision was made in the anterior axillary line at the left costal margin with a #15 blade.  A 5 mm Optiview trocar was advanced under direct vision through the abdominal wall and into the peritoneal cavity.  Pneumoperitoneum was established. Laparoscope was introduced and the abdomen was explored.  A normal- appearing spleen was present.  There were some peritoneal adhesions on the left upper quadrant above the level of the splenic flexure of the colon.  The liver appears grossly normal.  Colon appears grossly normal. Small bowel appears grossly normal.  Operative ports were then placed along the left costal margin.  A total of 4 trocars were placed.  Using the Harmonic scalpel, the peritoneal adhesions in the left upper quadrant were taken down.  Next, the peritoneum was incised just lateral to the spleen.  The positioning allows for a leaning spleen technique, where the spleen was mobilized laterally, allowing it to rotate medially and dependently.  As the spleen rotates, dissection was carried up and on to the greater curvature of the stomach.  The splenic vessels are identified.  The tail of the pancreas was identified.  All of this was reflected medially and dependently, allowing for exposure of the anterior surface of the  left kidney.  With gentle blunt dissection, the hilum of the kidney was dissected out. The adrenal gland was identified on the anteromedial surface of the superior pole of the kidney.  Overlying peritoneum was incised.  With slow and meticulous gentle blunt dissection, the glandular tissue of the adrenal gland was identified.  Dissection was carried towards the renal vein which was identified.  Based on the CT scan, the renal vein runs retroaortic.  The left adrenal vein was identified and dissected out down to near its junction with the renal vein.  It was doubly clipped distally and singly  clipped proximally using a 5 mm clip applier.  The vein was divided with the Harmonic scalpel.  Further gentle dissection reveals a second venous structure.  The first vein measured approximately 5 mm in diameter.  Likewise, the second venous structure measures approximately 5 mm in size.  It may represent a duplicated adrenal venous drainage system.  This was also dissected out, doubly clipped, and divided with the Harmonic scalpel.  Harmonic scalpel was then used to mobilize the adrenal vein off the superior pole of the kidney.  Gland was reflected from medial to lateral.  Dissection was carried superiorly until the entire adrenal gland was dissected out and freed from the surrounding adipose tissue.  The entire gland was resected and placed into an EndoCatch bag.  It was withdrawn through the 11 mm trocar site by dilating the site slightly and delivering the bag through the abdominal wall.  The adrenal gland was examined on the back table.  It was sectioned longitudinally.  It does appear to contain some small possible microadenomas.  The entire gland was submitted to Pathology for review.  Left upper quadrant of the abdomen was then inspected.  Fluid was evacuated.  Good hemostasis was noted throughout the operative field. Spleen appears normal.  Pancreas is uninjured.  Splenic vessels are intact.  Port sites are evaluated.  There is some bleeding at the site of the specimen extraction.  This was cauterized with the electrocautery.  A 0 Vicryl suture was placed to close the fascial defect.  Ports were then removed under direct vision and good hemostasis was noted.  Pneumoperitoneum was released.  Port sites were anesthetized with local anesthetic.  Port sites were closed with interrupted 4-0 Monocryl subcuticular sutures.  Wounds were washed and dried and Steri- Strips were applied.  Sterile dressings were applied.  The patient was returned to a supine position and awakened from  anesthesia.  The patient was brought to the recovery room in a stable condition.  The patient tolerated the procedure well.   Armandina Gemma, [facility] Office: [phone]    TMG/MEDQ  D:  09/16/2017  T:  09/16/2017  Job:  [id number]  cc:   [name], MD Marletta Lor, MD Clovis Riley, MD Thompson Grayer, MD Jacelyn Pi. Loanne Drilling, MD

## 2017-09-17 NOTE — Progress Notes (Signed)
General Surgery [facility]  Assessment & Plan: POD#1 - status post laparoscopic left adrenalectomy for primary hyperaldosteronism  Pain controlled with IV and po narcotics  Tolerating clear liquids - advance to regular diet  OOB, ambulate in halls today  HTN Rx per Dr. Rayann Heman, cardiology  Monitor potassium level - BMET pending  Anticipate discharge home tomorrow if diet tolerated and pain controlled        [name], MD, [facility]       Office: [phone]    Chief Complaint: Primary hyperaldosteronism  Subjective: Patient in bed, wife in room.  Moderate pain left abdominal wall. Tolerating clear liquids.  Objective: Vital signs in last 24 hours: Temp:  [97.5 F (36.4 C)-98.5 F (36.9 C)] 98.4 F (36.9 C) (11/21 0541) Pulse Rate:  [57-73] 73 (11/21 0541) Resp:  [9-17] 17 (11/21 0541) BP: (135-148)/(88-95) 143/93 (11/21 0541) SpO2:  [92 %-99 %] 97 % (11/21 0541) Weight:  [138.3 kg (305 lb)] 138.3 kg (305 lb) (11/20 1524) Last BM Date: 09/15/17  Intake/Output from previous day: 11/20 0701 - 11/21 0700 In: 2275 [P.O.:840; I.V.:1285] Out: 4166 [AYTKZ:6010; Blood:25] Intake/Output this shift: No intake/output data recorded.  Physical Exam: HEENT - sclerae clear, mucous membranes moist Neck - soft Chest - clear bilaterally Cor - RRR Abdomen - soft, BS present; dressings dry and intact Ext - no edema, non-tender Neuro - alert & oriented, no focal deficits  Lab Results:  Recent Labs    09/16/17 0646 09/16/17 1134  WBC  --  12.4*  HGB 13.3 14.7  HCT 39.0 42.4  PLT  --  248   BMET Recent Labs    09/16/17 0646 09/16/17 1134 09/16/17 1526  NA 145  --  140  K 3.2*  --  3.4*  CL  --   --  104  CO2  --   --  27  GLUCOSE 99  --  96  BUN  --   --  14  CREATININE  --  1.17 1.00  CALCIUM  --   --  8.3*   PT/INR No results for input(s): LABPROT, INR in the last 72 hours. Comprehensive Metabolic  Panel:    Component Value Date/Time   NA 140 09/16/2017 1526   NA 145 09/16/2017 0646   NA 142 03/20/2017 1622   K 3.4 (L) 09/16/2017 1526   K 3.2 (L) 09/16/2017 0646   CL 104 09/16/2017 1526   CL 103 09/10/2017 1159   CO2 27 09/16/2017 1526   CO2 28 09/10/2017 1159   BUN 14 09/16/2017 1526   BUN 12 09/10/2017 1159   BUN 19 03/20/2017 1622   CREATININE 1.00 09/16/2017 1526   CREATININE 1.17 09/16/2017 1134   CREATININE 1.39 (H) 10/01/2016 1106   CREATININE 1.05 09/06/2016 1500   GLUCOSE 96 09/16/2017 1526   GLUCOSE 99 09/16/2017 0646   CALCIUM 8.3 (L) 09/16/2017 1526   CALCIUM 8.6 (L) 09/10/2017 1159   AST 26 12/11/2015 0907   AST 21 07/01/2014 1019   ALT 34 12/11/2015 0907   ALT 28 07/01/2014 1019   ALKPHOS 61 12/11/2015 0907   ALKPHOS 65 07/01/2014 1019   BILITOT 0.5 12/11/2015 0907   BILITOT 1.2 07/01/2014 1019   PROT 6.9 12/11/2015 0907   PROT 7.4 07/01/2014 1019   ALBUMIN 4.0 12/11/2015 0907   ALBUMIN 4.3 07/01/2014 1019    Studies/Results: No results found.    Gregory Barrett Jerilynn Mages  09/17/2017  Patient ID: Gregory [facility], male   DOB: 1971/02/24, 46 y.o.   MRN: [id number]

## 2017-09-17 NOTE — Progress Notes (Signed)
Pt with temp at 100.5 at [place] with chills. BP at 157/90. Tylenol given. Rechecked temp at 2153, 99.1. Notified MD.

## 2017-09-17 NOTE — Discharge Instructions (Signed)
°  [facility] ° °LAPAROSCOPIC SURGERY:  POST-OP INSTRUCTIONS ° °Always review your discharge instruction sheet given to you by the facility where your surgery was performed. ° °A prescription for pain medication may be given to you upon discharge.  Take your pain medication as prescribed.  If narcotic pain medicine is not needed, then you may take acetaminophen (Tylenol) or ibuprofen (Advil) as needed. ° °Take your usually prescribed medications unless otherwise directed. ° °If you need a refill on your pain medication, please contact your pharmacy.  They will contact our office to request authorization. Prescriptions will not be filled after 5 P.M. or on weekends. ° °You should follow a light diet the first few days after arrival home, such as soup and crackers or toast.  Be sure to include plenty of fluids daily. ° °Most patients will experience some swelling and bruising in the area of the incisions.  Ice packs will help.  Swelling and bruising can take several days to resolve.  ° °It is common to experience some constipation after surgery.  Increasing fluid intake and taking a stool softener (such as Colace) will usually help or prevent this problem from occurring.  A mild laxative (Milk of Magnesia or Miralax) should be taken according to package instructions if there has been no bowel movement after 48 hours. ° °You will have steri-strips and a gauze dressing over your incisions.  You may remove the gauze bandage on the second day after surgery, and you may shower at that time.  Leave your steri-strips (small skin tapes) in place directly over the incision.  These strips should remain on the skin for 5-7 days and then be removed.  You may get them wet in the shower and pat them dry. ° °Any sutures or staples will be removed at the office during your follow-up visit. ° °ACTIVITIES:  You may resume regular (light) daily activities beginning the next day - such as daily self-care, walking,  climbing stairs - gradually increasing activities as tolerated.  You may have sexual intercourse when it is comfortable.  Refrain from any heavy lifting or straining until approved by your doctor. ° °You may drive when you are no longer taking prescription pain medication, you can comfortably wear a seatbelt, and you can safely maneuver your car and apply brakes. ° °You should see your doctor in the office for a follow-up appointment approximately 2-3 weeks after your surgery.  Make sure that you call for this appointment within a day or two after you arrive home to insure a convenient appointment time. ° °WHEN TO CALL YOUR DOCTOR: °1. Fever over 101.0 °2. Inability to urinate °3. Continued bleeding from incision °4. Increased pain, redness, or drainage from the incision °5. Increasing abdominal pain ° °The clinic staff is available to answer your questions during regular business hours.  Please don’t hesitate to call and ask to speak to one of the nurses for clinical concerns.  If you have a medical emergency, go to the nearest emergency room or call 911.  A surgeon from [facility] is always on call for the hospital. ° °Kerby Borner M. Jhon Mallozzi, MD, FACS °[facility] °Office: [phone] °Toll Free:  [phone] °FAX [phone] ° °Website: [email or web address] °

## 2017-09-17 NOTE — Progress Notes (Signed)
Progress Note   Subjective   Doing well today, the patient denies CP or SOB.  No new concerns  Pain is improving.  Inpatient Medications    Scheduled Meds: . carvedilol  12.5 mg Oral BID WC  . enoxaparin (LOVENOX) injection  40 mg Subcutaneous Q24H  . flecainide  50 mg Oral Q12H   Continuous Infusions: . dexrose 5 % and 0.45 % NaCl with KCl 30 mEq/L 100 mL/hr at 09/16/17 2210   PRN Meds: acetaminophen **OR** acetaminophen, diphenhydrAMINE, HYDROcodone-acetaminophen, HYDROmorphone (DILAUDID) injection, ondansetron **OR** ondansetron (ZOFRAN) IV, traMADol   Vital Signs    Vitals:   09/16/17 1140 09/16/17 1524 09/16/17 2211 09/17/17 0541  BP: (!) 137/95  139/90 (!) 143/93  Pulse: 61  64 73  Resp: 16  16 17   Temp: 97.9 F (36.6 C)  98.5 F (36.9 C) 98.4 F (36.9 C)  TempSrc: Oral  Oral Oral  SpO2: 96%  92% 97%  Weight:  (!) 305 lb (138.3 kg)    Height:  5\' 11"  (1.803 m)      Intake/Output Summary (Last 24 hours) at 09/17/2017 0904 Last data filed at 09/17/2017 0813 Gross per 24 hour  Intake 2355 ml  Output 5100 ml  Net -2745 ml   Filed Weights   09/16/17 1524  Weight: (!) 305 lb (138.3 kg)    Physical Exam   GEN- The patient is well appearing, alert and oriented x 3 today.   Head- normocephalic, atraumatic Eyes-  Sclera clear, conjunctiva pink Ears- hearing intact Oropharynx- clear Neck- supple, Lungs- Clear to ausculation bilaterally, normal work of breathing Heart- Regular rate and rhythm  GI- soft,  Appropriately tender Extremities- no clubbing, cyanosis, or edema      Labs    Chemistry Recent Labs  Lab 09/10/17 1159 09/16/17 0646 09/16/17 1134 09/16/17 1526 09/17/17 0728  NA 139 145  --  140 138  K 2.8* 3.2*  --  3.4* 3.6  CL 103  --   --  104 102  CO2 28  --   --  27 28  GLUCOSE 85 99  --  96 100*  BUN 12  --   --  14 10  CREATININE 0.94  --  1.17 1.00 1.00  CALCIUM 8.6*  --   --  8.3* 8.4*  GFRNONAA >60  --  >60 >60 >60  GFRAA  >60  --  >60 >60 >60  ANIONGAP 8  --   --  9 8     Hematology Recent Labs  Lab 09/10/17 1159 09/16/17 0646 09/16/17 1134 09/17/17 0728  WBC 8.8  --  12.4* 10.0  RBC 4.80  --  4.77 4.70  HGB 14.9 13.3 14.7 14.6  HCT 42.0 39.0 42.4 42.3  MCV 87.5  --  88.9 90.0  MCH 31.0  --  30.8 31.1  MCHC 35.5  --  34.7 34.5  RDW 13.3  --  13.4 13.4  PLT 261  --  248 233    Cardiac EnzymesNo results for input(s): TROPONINI in the last 168 hours. No results for input(s): TROPIPOC in the last 168 hours.      Assessment & Plan    1.  Hypertension secondary to hyperaldosteronism bp is stable Continue coreg for now and reassess as pain continues to improve Could increase coreg to 25mg  BID if heart rate allows  2. Paroxysmal atrial fibrillation Remains in sinus rhythm Restart flecainide 50mg  BID at this time  3. hypokalemia Improved  Thompson Grayer  MD, [facility] 09/17/2017 9:04 AM

## 2017-09-18 MED ORDER — CARVEDILOL 25 MG PO TABS: 25.0000 mg | ORAL_TABLET | Freq: Two times a day (BID) | ORAL | Status: DC

## 2017-09-18 MED ORDER — CARVEDILOL 12.5 MG PO TABS
12.5000 mg | ORAL_TABLET | Freq: Once | ORAL | Status: AC
  Administered 2017-09-18: 12.5 mg via ORAL
  Filled 2017-09-18: qty 1

## 2017-09-18 NOTE — Progress Notes (Signed)
   Progress Note   Subjective   Doing well today, the patient denies CP or SOB.  No new concerns.  Pain is improving.  Inpatient Medications    Scheduled Meds: . carvedilol  12.5 mg Oral Once  . carvedilol  25 mg Oral BID WC  . enoxaparin (LOVENOX) injection  40 mg Subcutaneous Q24H  . flecainide  50 mg Oral Q12H  . pantoprazole  40 mg Oral Daily   Continuous Infusions: . dexrose 5 % and 0.45 % NaCl with KCl 30 mEq/L 50 mL/hr at 09/17/17 0929   PRN Meds: acetaminophen **OR** acetaminophen, diphenhydrAMINE, HYDROcodone-acetaminophen, HYDROmorphone (DILAUDID) injection, ondansetron **OR** ondansetron (ZOFRAN) IV, traMADol   Vital Signs    Vitals:   09/17/17 1945 09/17/17 2015 09/17/17 2153 09/18/17 0403  BP: (!) 157/90  (!) 144/94 (!) 162/99  Pulse: 92  96 88  Resp: 20  18 18   Temp: (!) 100.5 F (38.1 C) 100.1 F (37.8 C) 99.1 F (37.3 C) 98.3 F (36.8 C)  TempSrc: Oral Oral Oral Oral  SpO2: 94%  96% 97%  Weight:      Height:        Intake/Output Summary (Last 24 hours) at 09/18/2017 1008 Last data filed at 09/18/2017 0650 Gross per 24 hour  Intake 4256.67 ml  Output 2950 ml  Net 1306.67 ml   Filed Weights   09/16/17 1524  Weight: (!) 305 lb (138.3 kg)     Physical Exam   GEN- The patient is well appearing, alert and oriented x 3 today.   Head- normocephalic, atraumatic Eyes-  Sclera clear, conjunctiva pink Ears- hearing intact Oropharynx- clear Neck- supple, Lungs- Clear to ausculation bilaterally, normal work of breathing Heart- Regular rate and rhythm  GI- soft, appropriately tender Extremities- no clubbing, cyanosis, or edema      Labs    Chemistry Recent Labs  Lab 09/16/17 0646 09/16/17 1134 09/16/17 1526 09/17/17 0728  NA 145  --  140 138  K 3.2*  --  3.4* 3.6  CL  --   --  104 102  CO2  --   --  27 28  GLUCOSE 99  --  96 100*  BUN  --   --  14 10  CREATININE  --  1.17 1.00 1.00  CALCIUM  --   --  8.3* 8.4*  GFRNONAA  --  >60  >60 >60  GFRAA  --  >60 >60 >60  ANIONGAP  --   --  9 8     Hematology Recent Labs  Lab 09/16/17 0646 09/16/17 1134 09/17/17 0728  WBC  --  12.4* 10.0  RBC  --  4.77 4.70  HGB 13.3 14.7 14.6  HCT 39.0 42.4 42.3  MCV  --  88.9 90.0  MCH  --  30.8 31.1  MCHC  --  34.7 34.5  RDW  --  13.4 13.4  PLT  --  248 233      Assessment & Plan    1.  Hypertension secondary to hyperaldosteronism bp remains a little bit high, though likely secondary to pain Will increase coreg to 25mg  BID today  2. Paroxysmal atrial fibrillation Maintaining sinus with flecainide 50mg  BID  3. Hypokalemia Improved  Cardiology team to see as needed while here.  OK to discharge from my standpoint when surgical issues are stable. Please call with questions.   Thompson Grayer MD, [facility] 09/18/2017 10:08 AM

## 2017-09-18 NOTE — Progress Notes (Signed)
Pt is ambulating to the hall, feeling better today. Pain is well controlled. Discharge instructions given to pt. Discharged to home accompanied by wife.

## 2017-09-18 NOTE — Discharge Summary (Signed)
Physician Discharge Summary [facility]  Patient ID: Gregory Barrett MRN: [id number] DOB/AGE: Jun 26, 1971 46 y.o.  Admit date: 09/16/2017 Discharge date: 09/18/2017  Admission Diagnoses:  Primary hyperaldosteronism  Discharge Diagnoses:  Principal Problem:   Primary hyperaldosteronism [facility])   Discharged Condition: good  Hospital Course: Patient was admitted for observation following laparoscopic adrenalectomy surgery.  Post op course was uncomplicated.  Pain was well controlled.  Tolerated diet.  Post op potassium level rose to 3.6.  Patient was seen in consultation by Dr. Thompson Grayer and medications for hypertension were adjusted.  Patient was prepared for discharge home on POD#2.  Consults: cardiology  Treatments: surgery: laparoscopic left adrenalectomy  Discharge Exam: Blood pressure (!) 162/99, pulse 88, temperature 98.3 F (36.8 C), temperature source Oral, resp. rate 18, height 5\' 11"  (1.803 m), weight (!) 138.3 kg (305 lb), SpO2 97 %. HEENT - clear Neck - soft Chest - clear bilaterally Cor - RRR Abd - wounds dry and intact; dressings changed  Disposition: Home  Discharge Instructions    Diet - low sodium heart healthy   Complete by:  As directed    Discharge wound care:   Complete by:  As directed    Cover lower wound with dry gauze dressing as needed for drainage.  Leave Steristrips in place one week.  May shower.   Increase activity slowly   Complete by:  As directed      Allergies as of 09/18/2017   No Known Allergies     Medication List    STOP taking these medications   diltiazem 120 MG 24 hr capsule Commonly known as:  CARDIZEM CD   diltiazem 30 MG tablet Commonly known as:  CARDIZEM     TAKE these medications   ALPRAZolam 0.25 MG tablet Commonly known as:  XANAX TAKE 1 TABLET BY MOUTH TWICE A DAY AS NEEDED What changed:    how much to take  how to take this  when to take this   carvedilol 25 MG tablet Commonly  known as:  COREG TAKE 1 TABLET (25 MG TOTAL) BY MOUTH 2 (TWO) TIMES DAILY.   esomeprazole 20 MG capsule Commonly known as:  NEXIUM Take 20 mg by mouth daily.   flecainide 100 MG tablet Commonly known as:  TAMBOCOR TAKE 1 TABLET BY MOUTH TWICE DAILY. PLEASE KEEP 07/07/17 FOR FUTURE REFILL AUTHORIZATION What changed:  See the new instructions.   HYDROcodone-acetaminophen 5-325 MG tablet Commonly known as:  NORCO/VICODIN Take 1 tablet by mouth every 4 (four) hours as needed for moderate pain.   LUBRICANT DROPS 1.4 % ophthalmic solution Generic drug:  polyvinyl alcohol Place 1 drop into both eyes 3 (three) times daily as needed for dry eyes.   potassium chloride SA 20 MEQ tablet Commonly known as:  K-DUR,KLOR-CON Take 1 tablet (20 mEq total) by mouth daily.            Discharge Care Instructions  (From admission, onward)        Start     Ordered   09/18/17 0000  Discharge wound care:    Comments:  Cover lower wound with dry gauze dressing as needed for drainage.  Leave Steristrips in place one week.  May shower.   09/18/17 1048     Follow-up Information    Armandina Gemma, MD. Schedule an appointment as soon as possible for a visit in 3 week(s).   Specialty:  General Surgery Contact information: [place]  [place], MD, [facility] Office: [phone]   Signed: [name] 09/18/2017, 10:48 AM

## 2017-09-23 ENCOUNTER — Ambulatory Visit (INDEPENDENT_AMBULATORY_CARE_PROVIDER_SITE_OTHER): Payer: BLUE CROSS/BLUE SHIELD | Admitting: Adult Health

## 2017-09-23 ENCOUNTER — Encounter: Payer: Self-pay | Admitting: Adult Health

## 2017-09-23 VITALS — BP 132/98 | Temp 98.9°F | Wt 295.0 lb

## 2017-09-23 DIAGNOSIS — M109 Gout, unspecified: Secondary | ICD-10-CM

## 2017-09-23 MED ORDER — INDOMETHACIN 50 MG PO CAPS: 50.0000 mg | ORAL_CAPSULE | Freq: Three times a day (TID) | ORAL | 0 refills | Status: AC

## 2017-09-23 NOTE — Progress Notes (Signed)
Subjective:    Patient ID: Gregory Barrett, male    DOB: [birth year], 46 y.o.   MRN: [id number]  HPI  46 year old male who  has a past medical history of Anxiety, Arthritis, GERD (gastroesophageal reflux disease), Hyperaldosteronism ([place]), Hypertension, Melanoma of lower back ([place]) (2000s), Migraine, Persistent atrial fibrillation ([place]), and Pneumonia.  He presents to the office today for pain, swelling, redness and warmth of left foot. He reports that he first noticed this 2-3 days ago. Recently had surgery to remove an adrenal gland. Reports that he had a history of gout that this is is what it feels like. Pain is located across the top and on the outside of his left foot   Denies any trauma.   Review of Systems See HPI   Past Medical History:  Diagnosis Date  . Anxiety    when he was in A-fib, attributed to adrenal gland and fatigue  . Arthritis    legs, ankles  . GERD (gastroesophageal reflux disease)   . Hyperaldosteronism ([place])   . Hypertension   . Melanoma of lower back ([place]) 2000s  . Migraine    none for years  . Persistent atrial fibrillation ([place])   . Pneumonia    "a few times; last time ~ 2010" (04/03/2017)    Social History   Socioeconomic History  . Marital status: Married    Spouse name: Not on file  . Number of children: Not on file  . Years of education: Not on file  . Highest education level: Not on file  Social Needs  . Financial resource strain: Not on file  . Food insecurity - worry: Not on file  . Food insecurity - inability: Not on file  . Transportation needs - medical: Not on file  . Transportation needs - non-medical: Not on file  Occupational History  . Not on file  Tobacco Use  . Smoking status: Former Smoker    Packs/day: 0.50    Years: 2.00    Pack years: 1.00    Types: Cigarettes    Last attempt to quit: 09/20/2010    Years since quitting: 7.0  . Smokeless tobacco: Former Systems developer    Types: [place] date: 11/03/2014  Substance and  Sexual Activity  . Alcohol use: Yes    Alcohol/week: 1.8 oz    Types: 3 Glasses of wine per week  . Drug use: No  . Sexual activity: Yes  Other Topics Concern  . Not on file  Social History Narrative  . Not on file    Past Surgical History:  Procedure Laterality Date  . ATRIAL FIBRILLATION ABLATION N/A 04/03/2017   Procedure: Atrial Fibrillation Ablation;  Surgeon: Thompson Grayer, MD;  Location: [place] CV LAB;  Service: Cardiovascular;  Laterality: N/A;  . CARDIOVERSION N/A 09/13/2016   Procedure: CARDIOVERSION;  Surgeon: Fay Records, MD;  Location: [facility] ENDOSCOPY;  Service: Cardiovascular;  Laterality: N/A;  . CARDIOVERSION N/A 10/02/2016   Procedure: CARDIOVERSION;  Surgeon: Larey Dresser, MD;  Location: [place];  Service: Cardiovascular;  Laterality: N/A;  . CARDIOVERSION N/A 04/10/2017   Procedure: CARDIOVERSION;  Surgeon: Fay Records, MD;  Location: [place];  Service: Cardiovascular;  Laterality: N/A;  . CARDIOVERSION N/A 04/28/2017   Procedure: CARDIOVERSION;  Surgeon: Pixie Casino, MD;  Location: [place];  Service: Cardiovascular;  Laterality: N/A;  . HARDWARE REMOVAL Right 12/12/2015   Procedure: HARDWARE REMOVAL RIGHT TIBIAL;  Surgeon: Altamese Montcalm, MD;  Location:  [place] OR;  Service: Orthopedics;  Laterality: Right;  . IR US GUIDE VASC ACCESS RIGHT  07/16/2017  . IR US GUIDE VASC ACCESS RIGHT  07/16/2017  . IR VENOGRAM ADRENAL BI  07/16/2017  . IR VENOGRAM RENAL UNI LEFT  07/16/2017  . IR VENOUS SAMPLING  07/16/2017  . IR VENOUS SAMPLING  07/16/2017  . JOINT REPLACEMENT    . LAPAROSCOPIC ADRENALECTOMY Left 09/16/2017  . LAPAROSCOPIC ADRENALECTOMY Left 09/16/2017   Procedure: LAPAROSCOPIC LEFT ADRENALECTOMY;  Surgeon: Armandina Gemma, MD;  Location: [place];  Service: General;  Laterality: Left;  Marland Kitchen MELANOMA EXCISION     "back"  . ORIF NONUNION / MALUNION TIBIAL FRACTURE Right 2005   "put plates & screws in"  . TOTAL KNEE ARTHROPLASTY Right 03/19/2016   Procedure:  RIGHT TOTAL KNEE ARTHROPLASTY;  Surgeon: Paralee Cancel, MD;  Location: [facility];  Service: Orthopedics;  Laterality: Right;  Marland Kitchen VASECTOMY      Family History  Problem Relation Age of Onset  . Meniere's disease Mother   . Bladder Cancer Father   . Hypertension Maternal Grandmother   . Cancer Maternal Grandfather        lung ca  . Adrenal disorder Neg Hx     No Known Allergies  Current Outpatient Medications on File Prior to Visit  Medication Sig Dispense Refill  . carvedilol (COREG) 25 MG tablet TAKE 1 TABLET (25 MG TOTAL) BY MOUTH 2 (TWO) TIMES DAILY. 180 tablet 3  . esomeprazole (NEXIUM) 20 MG capsule Take 20 mg by mouth daily.    . flecainide (TAMBOCOR) 50 MG tablet Take 1 tablet by mouth 2 (two) times daily.  3  . HYDROcodone-acetaminophen (NORCO/VICODIN) 5-325 MG tablet Take 1 tablet by mouth every 4 (four) hours as needed for moderate pain. 30 tablet 0  . ALPRAZolam (XANAX) 0.25 MG tablet TAKE 1 TABLET BY MOUTH TWICE A DAY AS NEEDED (Patient not taking: Reported on 09/23/2017) 30 tablet 0   No current facility-administered medications on file prior to visit.     BP (!) 132/98 (BP Location: Left Arm)   Temp 98.9 F (37.2 C) (Oral)   Wt 295 lb (133.8 kg)   BMI 41.14 kg/m       Objective:   Physical Exam  Constitutional: He is oriented to person, place, and time. He appears well-developed and well-nourished. No distress.  Cardiovascular: Normal rate, regular rhythm, normal heart sounds and intact distal pulses. Exam reveals no gallop and no friction rub.  No murmur heard. Pulmonary/Chest: Effort normal and breath sounds normal. No respiratory distress. He has no wheezes. He has no rales. He exhibits no tenderness.  Musculoskeletal: He exhibits edema and tenderness.  Redness, warmth, swelling and tenderness noted along dorsal and lateral aspects of left foot.   No redness, warmth, or tenderness noted to left calf  No streaking  Homans sign negative  Neurological: He is  alert and oriented to person, place, and time.  Skin: Skin is warm and dry. No rash noted. He is not diaphoretic. No erythema. No pallor.  Psychiatric: He has a normal mood and affect. His behavior is normal. Thought content normal.  Nursing note and vitals reviewed.     Assessment & Plan:  1. Acute gout of left foot, unspecified cause - Exam consistent with gout. Will get uric acid level and start on Indocin  - Uric Acid - CBC with Differential/Platelet - indomethacin (INDOCIN) 50 MG capsule; Take 1 capsule (50 mg total) by mouth 3 (three) times daily  with meals for 14 days.  Dispense: 42 capsule; Refill: 0  Dorothyann Peng, NP

## 2017-09-24 LAB — CBC WITH DIFFERENTIAL/PLATELET
BASOS PCT: 0.5 % (ref 0.0–3.0)
Basophils Absolute: 0.1 10*3/uL (ref 0.0–0.1)
EOS PCT: 2.7 % (ref 0.0–5.0)
Eosinophils Absolute: 0.3 10*3/uL (ref 0.0–0.7)
HCT: 43.9 % (ref 39.0–52.0)
Hemoglobin: 14.8 g/dL (ref 13.0–17.0)
LYMPHS ABS: 2.2 10*3/uL (ref 0.7–4.0)
Lymphocytes Relative: 19.1 % (ref 12.0–46.0)
MCHC: 33.7 g/dL (ref 30.0–36.0)
MCV: 91.4 fl (ref 78.0–100.0)
MONO ABS: 1.6 10*3/uL — AB (ref 0.1–1.0)
Monocytes Relative: 13.6 % — ABNORMAL HIGH (ref 3.0–12.0)
NEUTROS PCT: 64.1 % (ref 43.0–77.0)
Neutro Abs: 7.5 10*3/uL (ref 1.4–7.7)
Platelets: 393 10*3/uL (ref 150.0–400.0)
RBC: 4.8 Mil/uL (ref 4.22–5.81)
RDW: 12.8 % (ref 11.5–15.5)
WBC: 11.7 10*3/uL — ABNORMAL HIGH (ref 4.0–10.5)

## 2017-09-24 LAB — URIC ACID: Uric Acid, Serum: 8.3 mg/dL — ABNORMAL HIGH (ref 4.0–7.8)

## 2017-09-28 ENCOUNTER — Encounter: Payer: Self-pay | Admitting: Internal Medicine

## 2017-10-08 ENCOUNTER — Encounter: Payer: Self-pay | Admitting: Internal Medicine

## 2017-10-08 ENCOUNTER — Ambulatory Visit (INDEPENDENT_AMBULATORY_CARE_PROVIDER_SITE_OTHER): Payer: BLUE CROSS/BLUE SHIELD | Admitting: Internal Medicine

## 2017-10-08 VITALS — BP 130/98 | HR 83 | Ht 72.0 in | Wt 296.0 lb

## 2017-10-08 DIAGNOSIS — I4819 Other persistent atrial fibrillation: Secondary | ICD-10-CM

## 2017-10-08 DIAGNOSIS — I481 Persistent atrial fibrillation: Secondary | ICD-10-CM

## 2017-10-08 DIAGNOSIS — I10 Essential (primary) hypertension: Secondary | ICD-10-CM

## 2017-10-08 MED ORDER — LOSARTAN POTASSIUM 25 MG PO TABS: 25.0000 mg | ORAL_TABLET | Freq: Every day | ORAL | 3 refills | Status: DC

## 2017-10-08 NOTE — Patient Instructions (Addendum)
Medication Instructions:  Your physician has recommended you make the following change in your medication:  1) START Losartan - 25 mg once daily. In 2 weeks if BP remains elevated increase to 50 mg (2 tabs) once daily    Labwork: Your physician recommends that you return for lab work today: BMET    Testing/Procedures: None ordered   Follow-Up: Your physician wants you to follow-up in: 3-4 weeks with your PCP and in 2 months with Dr. Rayann Heman. You will receive a reminder letter in the mail two months in advance. If you don't receive a letter, please call our office to schedule the follow-up appointment.   Any Other Special Instructions Will Be Listed Below (If Applicable).     If you need a refill on your cardiac medications before your next appointment, please call your pharmacy.

## 2017-10-08 NOTE — Progress Notes (Signed)
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  Since I saw him in the hospital, he has done well. BP remains elevated. Today, he denies symptoms of palpitations, chest pain, shortness of breath,  lower extremity edema, dizziness, presyncope, or syncope.  The patient is otherwise without complaint today.   Past Medical History:  Diagnosis Date  . Anxiety    when he was in A-fib, attributed to adrenal gland and fatigue  . Arthritis    legs, ankles  . GERD (gastroesophageal reflux disease)   . Hyperaldosteronism ([place])   . Hypertension   . Melanoma of lower back ([place]) 2000s  . Migraine    none for years  . Persistent atrial fibrillation ([place])   . Pneumonia    "a few times; last time ~ 2010" (04/03/2017)   Past Surgical History:  Procedure Laterality Date  . ATRIAL FIBRILLATION ABLATION N/A 04/03/2017   Procedure: Atrial Fibrillation Ablation;  Surgeon: Thompson Grayer, MD;  Location: [place] CV LAB;  Service: Cardiovascular;  Laterality: N/A;  . CARDIOVERSION N/A 09/13/2016   Procedure: CARDIOVERSION;  Surgeon: Fay Records, MD;  Location: [place];  Service: Cardiovascular;  Laterality: N/A;  . CARDIOVERSION N/A 10/02/2016   Procedure: CARDIOVERSION;  Surgeon: Larey Dresser, MD;  Location: [place];  Service: Cardiovascular;  Laterality: N/A;  . CARDIOVERSION N/A 04/10/2017   Procedure: CARDIOVERSION;  Surgeon: Fay Records, MD;  Location: [place];  Service: Cardiovascular;  Laterality: N/A;  . CARDIOVERSION N/A 04/28/2017   Procedure: CARDIOVERSION;  Surgeon: Pixie Casino, MD;  Location: [place];  Service: Cardiovascular;  Laterality: N/A;  . HARDWARE REMOVAL Right 12/12/2015   Procedure: HARDWARE REMOVAL RIGHT TIBIAL;  Surgeon: Altamese [place], MD;  Location: [place];  Service: Orthopedics;  Laterality: Right;  . IR US GUIDE VASC ACCESS RIGHT  07/16/2017  . IR US  GUIDE VASC ACCESS RIGHT  07/16/2017  . IR VENOGRAM ADRENAL BI  07/16/2017  . IR VENOGRAM RENAL UNI LEFT  07/16/2017  . IR VENOUS SAMPLING  07/16/2017  . IR VENOUS SAMPLING  07/16/2017  . JOINT REPLACEMENT    . LAPAROSCOPIC ADRENALECTOMY Left 09/16/2017  . LAPAROSCOPIC ADRENALECTOMY Left 09/16/2017   Procedure: LAPAROSCOPIC LEFT ADRENALECTOMY;  Surgeon: Armandina Gemma, MD;  Location: [place];  Service: General;  Laterality: Left;  Marland Kitchen MELANOMA EXCISION     "back"  . ORIF NONUNION / MALUNION TIBIAL FRACTURE Right 2005   "put plates & screws in"  . TOTAL KNEE ARTHROPLASTY Right 03/19/2016   Procedure: RIGHT TOTAL KNEE ARTHROPLASTY;  Surgeon: Paralee Cancel, MD;  [facility];  Service: Orthopedics;  Laterality: Right;  Marland Kitchen VASECTOMY      ROS- all systems are reviewed and negatives except as per HPI above  Current Outpatient Medications  Medication Sig Dispense Refill  . ALPRAZolam (XANAX) 0.25 MG tablet TAKE 1 TABLET BY MOUTH TWICE A DAY AS NEEDED 30 tablet 0  . carvedilol (COREG) 25 MG tablet TAKE 1 TABLET (25 MG TOTAL) BY MOUTH 2 (TWO) TIMES DAILY. 180 tablet 3  . esomeprazole (NEXIUM) 20 MG capsule Take 20 mg by mouth daily.    . flecainide (TAMBOCOR) 50 MG tablet Take 1 tablet by mouth 2 (two) times daily.  3   No current facility-administered medications for this visit.     Physical Exam: Vitals:   10/08/17 1622  BP: (!) 130/98  Pulse:  83  SpO2: 99%  Weight: 296 lb (134.3 kg)  Height: 6' (1.829 m)    GEN- The patient is well appearing, alert and oriented x 3 today.   Head- normocephalic, atraumatic Eyes-  Sclera clear, conjunctiva pink Ears- hearing intact Oropharynx- clear Lungs- Clear to ausculation bilaterally, normal work of breathing Heart- Regular rate and rhythm, no murmurs, rubs or gallops, PMI not laterally displaced GI- soft, NT, ND, + BS Extremities- no clubbing, cyanosis, or edema  EKG tracing ordered today is personally reviewed and shows sinus rhythm, normal  ekg  Assessment and Plan:  1. Persistent afib Doing well s/p ablation on low dose flecainide chads2vasc score is 1.  Doing well off xarelto  2. HTN remains elevated s/p adrenalectomy for hyperaldosteronism Start losartan 25mg  daily today.  He can increased to 50mg  daily if remains elevated in 2 weeks.  Follow-up with PCP in 3-4 weeks. I will see in 2 months    Thompson Grayer MD, [facility] 10/08/2017 4:36 PM

## 2017-10-09 LAB — BASIC METABOLIC PANEL
BUN / CREAT RATIO: 16 (ref 9–20)
BUN: 20 mg/dL (ref 6–24)
CHLORIDE: 104 mmol/L (ref 96–106)
CO2: 24 mmol/L (ref 20–29)
Calcium: 9 mg/dL (ref 8.7–10.2)
Creatinine, Ser: 1.22 mg/dL (ref 0.76–1.27)
GFR calc non Af Amer: 72 mL/min/{1.73_m2} (ref >59)
GFR, EST AFRICAN AMERICAN: 83 mL/min/{1.73_m2} (ref >59)
GLUCOSE: 88 mg/dL (ref 65–99)
POTASSIUM: 4.4 mmol/L (ref 3.5–5.2)
Sodium: 144 mmol/L (ref 134–144)

## 2017-11-28 ENCOUNTER — Encounter: Payer: Self-pay | Admitting: Internal Medicine

## 2017-12-02 ENCOUNTER — Telehealth: Payer: Self-pay | Admitting: Internal Medicine

## 2017-12-02 MED ORDER — LOSARTAN POTASSIUM 50 MG PO TABS: 50.0000 mg | ORAL_TABLET | Freq: Every day | ORAL | 0 refills | Status: DC

## 2017-12-02 NOTE — Telephone Encounter (Signed)
Xanax 0.25 mg refill Last OV: 12/2016, but did not address Xanax Last Refill:08/06/17 30 tabs/0 refill [facility] [id number]

## 2017-12-02 NOTE — Telephone Encounter (Signed)
Copied from [place]. Topic: Quick Communication - See Telephone Encounter >> Dec 02, 2017 10:42 AM Boyd Kerbs wrote: CRM for notification. See Telephone encounter for:    Gerald Stabs from [facility] [employer]. [phone] Pharmacist having problem getting fax to go through.. Asking for refill on  ALPRAZolam (XANAX) 0.25 MG tablet 12/02/17.

## 2017-12-02 NOTE — Telephone Encounter (Signed)
Returned call to patient who states that he is going to run out of losartan. Patient states that he has been taking losartan 50 mg QD. Patient states that he increased it to 50 mg QD at Dr. Jackalyn Lombard instruction at his last OV on 12/12 since his BP was still elevated:   "Start losartan 25mg  daily today.  He can increased to 50mg  daily if remains elevated in 2 weeks."  Patient is scheduled to see JA on 2/18. New Rx for 30 day supply sent in to preferred pharmacy.

## 2017-12-02 NOTE — Telephone Encounter (Signed)
New message    *STAT* If patient is at the pharmacy, call can be transferred to refill team.   1. Which medications need to be refilled? (please list name of each medication and dose if known) Losartan 50 mg  2. Which pharmacy/location (including street and city if local pharmacy) is medication to be sent to?  [facility] [place] [place] [phone] (Phone) [phone] (Fax)    3. Do they need a 30 day or 90 day supply?   Pt states that [facility] told him to double his prescription if Bp does not go down and so he did and now he is out of meds

## 2017-12-05 DIAGNOSIS — F4320 Adjustment disorder, unspecified: Secondary | ICD-10-CM | POA: Diagnosis not present

## 2017-12-09 ENCOUNTER — Other Ambulatory Visit: Payer: Self-pay | Admitting: Internal Medicine

## 2017-12-09 MED ORDER — ALPRAZOLAM 0.25 MG PO TABS: 0.2500 mg | ORAL_TABLET | Freq: Two times a day (BID) | ORAL | 0 refills | Status: DC | PRN

## 2017-12-09 NOTE — Telephone Encounter (Signed)
Rx printed, awaiting MD to sign.  

## 2017-12-09 NOTE — Telephone Encounter (Signed)
[facility] faxed to [facility].  

## 2017-12-09 NOTE — Telephone Encounter (Signed)
Okay for refill #60 

## 2017-12-09 NOTE — Telephone Encounter (Signed)
Ok to refill? Please advise.  

## 2017-12-11 ENCOUNTER — Encounter: Payer: Self-pay | Admitting: Internal Medicine

## 2017-12-11 ENCOUNTER — Ambulatory Visit (INDEPENDENT_AMBULATORY_CARE_PROVIDER_SITE_OTHER): Payer: BLUE CROSS/BLUE SHIELD | Admitting: Internal Medicine

## 2017-12-11 VITALS — BP 118/86 | HR 85 | Temp 98.7°F | Ht 72.0 in | Wt 294.0 lb

## 2017-12-11 DIAGNOSIS — I10 Essential (primary) hypertension: Secondary | ICD-10-CM

## 2017-12-11 DIAGNOSIS — E2609 Other primary hyperaldosteronism: Secondary | ICD-10-CM

## 2017-12-11 DIAGNOSIS — I480 Paroxysmal atrial fibrillation: Secondary | ICD-10-CM | POA: Diagnosis not present

## 2017-12-11 MED ORDER — CLONAZEPAM 0.5 MG PO TABS: 0.5000 mg | ORAL_TABLET | Freq: Two times a day (BID) | ORAL | 2 refills | Status: DC | PRN

## 2017-12-11 NOTE — Progress Notes (Signed)
Subjective:    Patient ID: Gregory Barrett, male    DOB: 11/14/1970, 47 y.o.   MRN: [id number]  HPI  BP Readings from Last 3 Encounters:  12/11/17 118/86  10/08/17 (!) 130/98  09/23/17 (!) 132/98   Admit date: 09/16/2017 Discharge date: 09/18/2017  Admission Diagnoses:  Primary hyperaldosteronism  47 year old patient who is seen today in follow-up;  he has a history of hypertension secondary to primary hyperparathyroidism.  He is status post left adrenalectomy in November of last year.  He has had postoperative electrolytes that revealed potassium level in a high normal range. He is followed by cardiology with paroxysmal atrial fibrillation.  He states this has been stable but he has had some frequent PVCs He has had considerable situational stress due to issues with his wife who has been diagnosed with bipolar disorder.  She is scheduled to see psychiatry. The patient is being followed by a counselor  Past Medical History:  Diagnosis Date  . Anxiety    when he was in A-fib, attributed to adrenal gland and fatigue  . Arthritis    legs, ankles  . GERD (gastroesophageal reflux disease)   . Hyperaldosteronism ([place])   . Hypertension   . Melanoma of lower back ([place]) 2000s  . Migraine    none for years  . Persistent atrial fibrillation ([place])   . Pneumonia    "a few times; last time ~ 2010" (04/03/2017)     Social History   Socioeconomic History  . Marital status: Married    Spouse name: Not on file  . Number of children: Not on file  . Years of education: Not on file  . Highest education level: Not on file  Social Needs  . Financial resource strain: Not on file  . Food insecurity - worry: Not on file  . Food insecurity - inability: Not on file  . Transportation needs - medical: Not on file  . Transportation needs - non-medical: Not on file  Occupational History  . Not on file  Tobacco Use  . Smoking status: Former Smoker    Packs/day: 0.50    Years: 2.00    Pack  years: 1.00    Types: Cigarettes    Last attempt to quit: 09/20/2010    Years since quitting: 7.2  . Smokeless tobacco: Former Systems developer    Types: [place] date: 11/03/2014  Substance and Sexual Activity  . Alcohol use: Yes    Alcohol/week: 1.8 oz    Types: 3 Glasses of wine per week  . Drug use: No  . Sexual activity: Yes  Other Topics Concern  . Not on file  Social History Narrative  . Not on file    Past Surgical History:  Procedure Laterality Date  . ATRIAL FIBRILLATION ABLATION N/A 04/03/2017   Procedure: Atrial Fibrillation Ablation;  Surgeon: Thompson Grayer, MD;  Location: [facility];  Service: Cardiovascular;  Laterality: N/A;  . CARDIOVERSION N/A 09/13/2016   Procedure: CARDIOVERSION;  Surgeon: Fay Records, MD;  Location: [facility] ENDOSCOPY;  Service: Cardiovascular;  Laterality: N/A;  . CARDIOVERSION N/A 10/02/2016   Procedure: CARDIOVERSION;  Surgeon: Larey Dresser, MD;  Location: [facility] ENDOSCOPY;  Service: Cardiovascular;  Laterality: N/A;  . CARDIOVERSION N/A 04/10/2017   Procedure: CARDIOVERSION;  Surgeon: Fay Records, MD;  Location: [place];  Service: Cardiovascular;  Laterality: N/A;  . CARDIOVERSION N/A 04/28/2017   Procedure: CARDIOVERSION;  Surgeon: Pixie Casino, MD;  Location: [place];  Service: Cardiovascular;  Laterality: N/A;  . HARDWARE REMOVAL Right 12/12/2015   Procedure: HARDWARE REMOVAL RIGHT TIBIAL;  Surgeon: Altamese Burneyville, MD;  Location: [place];  Service: Orthopedics;  Laterality: Right;  . IR US GUIDE VASC ACCESS RIGHT  07/16/2017  . IR US GUIDE VASC ACCESS RIGHT  07/16/2017  . IR VENOGRAM ADRENAL BI  07/16/2017  . IR VENOGRAM RENAL UNI LEFT  07/16/2017  . IR VENOUS SAMPLING  07/16/2017  . IR VENOUS SAMPLING  07/16/2017  . JOINT REPLACEMENT    . LAPAROSCOPIC ADRENALECTOMY Left 09/16/2017  . LAPAROSCOPIC ADRENALECTOMY Left 09/16/2017   Procedure: LAPAROSCOPIC LEFT ADRENALECTOMY;  Surgeon: Armandina Gemma, MD;  Location: [place];  Service: General;   Laterality: Left;  Marland Kitchen MELANOMA EXCISION     "back"  . ORIF NONUNION / MALUNION TIBIAL FRACTURE Right 2005   "put plates & screws in"  . TOTAL KNEE ARTHROPLASTY Right 03/19/2016   Procedure: RIGHT TOTAL KNEE ARTHROPLASTY;  Surgeon: Paralee Cancel, MD;  Location: [facility];  Service: Orthopedics;  Laterality: Right;  Marland Kitchen VASECTOMY      Family History  Problem Relation Age of Onset  . Meniere's disease Mother   . Bladder Cancer Father   . Hypertension Maternal Grandmother   . Cancer Maternal Grandfather        lung ca  . Adrenal disorder Neg Hx     No Known Allergies  Current Outpatient Medications on File Prior to Visit  Medication Sig Dispense Refill  . esomeprazole (NEXIUM) 20 MG capsule Take 20 mg by mouth daily.    . flecainide (TAMBOCOR) 50 MG tablet Take 1 tablet by mouth 2 (two) times daily.  3  . losartan (COZAAR) 50 MG tablet Take 1 tablet (50 mg total) by mouth daily. 30 tablet 0  . carvedilol (COREG) 25 MG tablet TAKE 1 TABLET (25 MG TOTAL) BY MOUTH 2 (TWO) TIMES DAILY. 180 tablet 3   No current facility-administered medications on file prior to visit.     BP 118/86 (BP Location: Left Arm, Patient Position: Sitting, Cuff Size: Large)   Pulse 85   Temp 98.7 F (37.1 C) (Oral)   Ht 6' (1.829 m)   Wt 294 lb (133.4 kg)   SpO2 98%   BMI 39.87 kg/m    Review of Systems  Constitutional: Negative for appetite change, chills, fatigue and fever.  HENT: Negative for congestion, dental problem, ear pain, hearing loss, sore throat, tinnitus, trouble swallowing and voice change.   Eyes: Negative for pain, discharge and visual disturbance.  Respiratory: Negative for cough, chest tightness, wheezing and stridor.   Cardiovascular: Negative for chest pain, palpitations and leg swelling.  Gastrointestinal: Negative for abdominal distention, abdominal pain, blood in stool, constipation, diarrhea, nausea and vomiting.  Genitourinary: Negative for difficulty urinating, discharge, flank  pain, genital sores, hematuria and urgency.  Musculoskeletal: Negative for arthralgias, back pain, gait problem, joint swelling, myalgias and neck stiffness.  Skin: Negative for rash.  Neurological: Negative for dizziness, syncope, speech difficulty, weakness, numbness and headaches.  Hematological: Negative for adenopathy. Does not bruise/bleed easily.  Psychiatric/Behavioral: Positive for decreased concentration, dysphoric mood and sleep disturbance. Negative for behavioral problems. The patient is nervous/anxious.        Objective:   Physical Exam  Constitutional: He is oriented to person, place, and time. He appears well-developed.  Weight 294 Blood pressure 124/84  HENT:  Head: Normocephalic.  Right Ear: External ear normal.  Left Ear: External ear normal.  Eyes: Conjunctivae and EOM are  normal.  Neck: Normal range of motion.  Cardiovascular: Normal rate and normal heart sounds.  Pulmonary/Chest: Breath sounds normal.  Abdominal: Bowel sounds are normal.  Musculoskeletal: Normal range of motion. He exhibits no edema or tenderness.  Neurological: He is alert and oriented to person, place, and time.  Psychiatric: He has a normal mood and affect. His behavior is normal.          Assessment & Plan:   Hypertension fair control History of primary hyperaldosteronism status post left adrenalectomy Situational stress.  Continue benzodiazepine use.  Will switch to Klonopin for longer half-life continue follow-up with his counselor Return here in 3 months for follow-up.  If improved helpful to taper and discontinue [place]

## 2017-12-11 NOTE — Patient Instructions (Addendum)
Limit your sodium (Salt) intake  Please check your blood pressure on a regular basis.  If it is consistently greater than 150/90, please make an office appointment.    It is important that you exercise regularly, at least 20 minutes 3 to 4 times per week.  If you develop chest pain or shortness of breath seek  medical attention.  Return in 3 months for follow-up  Living With Bipolar Disorder If you have been diagnosed with bipolar disorder, you may be relieved that you now know why you have felt or behaved a certain way. You may also feel overwhelmed about the treatment ahead, how to get the support you need, and how to deal with the condition day-to-day. With care and support, you can learn to manage your symptoms and live with bipolar disorder. How to manage lifestyle changes Managing stress Stress is your body's reaction to life changes and events, both good and bad. Stress can play a major role in bipolar disorder, so it is important to learn how to cope with stress. Some techniques to cope with stress include:  Meditation, muscle relaxation, and breathing exercises.  Exercise. Even a short daily walk can help to lower stress levels.  Getting enough good-quality sleep. Too little sleep can cause mania to start (can trigger mania).  Making a schedule to manage your time. Knowing your daily schedule can help to keep you from feeling overwhelmed by tasks and deadlines.  Spending time on hobbies that you enjoy.  Medicines Your health care provider may suggest certain medicines if he or she feels that they will help improve your condition. Avoid using caffeine, alcohol, and other substances that may prevent your medicines from working properly (may interact). It is also important to:  Talk with your pharmacist or health care provider about all the medicines that you take, their possible side effects, and which medicines are safe to take together.  Make it your goal to take part in all  treatment decisions (shared decision-making). Ask about possible side effects of medicines that your health care provider recommends, and tell him or her how you feel about having those side effects. It is best if shared decision-making with your health care provider is part of your total treatment plan.  If you are taking medicines as part of your treatment, do not stop taking medicines before you ask your health care provider if it is safe to stop. You may need to have the medicine slowly decreased (tapered) over time to decrease the risk of harmful side effects. Relationships Spend time with people that you trust and with whom you feel a sense of understanding and calm. Try to find friends or family members who make you feel safe and can help you control feelings of mania. Consider going to couples counseling, family education classes, or family therapy to:  Educate your loved ones about your condition and offer suggestions about how they can support you.  Help resolve conflicts.  Help develop communication skills in your relationships.  How to recognize changes in your condition Everyone responds differently to treatment for bipolar disorder. Some signs that your condition is improving include:  Leveling of your mood. You may have less anger and excitement about daily activities, and your low moods may not be as bad.  Your symptoms being less intense.  Feeling calm more often.  Thinking clearly.  Not experiencing consequences for extreme behavior.  Feeling like your life is settling down.  Your behavior seeming more normal to you  and to other people.  Some signs that your condition may be getting worse include:  Sleep problems.  Moods cycling between deep lows and unusually high (excess) energy.  Extreme emotions.  More anger at loved ones.  Staying away from others (isolating yourself).  A feeling of power or superiority.  Completing a lot of tasks in a very short  amount of time.  Unusual thoughts and behaviors.  Suicidal thoughts.  Where to find support Talking to others  Try making a list of the people you may want to tell about your condition, such as the people you trust most.  Plan what you are willing to talk about and what you do not want to discuss. Think about your needs ahead of time, and how your friends and family members can support you.  Let your loved ones know when they can share advice and when you would just like them to listen.  Give your loved ones information about bipolar disorder, and encourage them to learn about the condition. Finances Not all insurance plans cover mental health care, so it is important to check with your insurance carrier. If paying for co-pays or counseling services is a problem, search for a local or county mental health care center. Public mental health care services may be offered there at a low cost or no cost when you are not able to see a private health care provider. If you are taking medicine for depression, you may be able to get the generic form, which may be less expensive than brand-name medicine. Some makers of prescription medicines also offer help to patients who cannot afford the medicines they need. Follow these instructions at home: Medicines  Take over-the-counter and prescription medicines only as told by your health care provider or pharmacist.  Ask your pharmacist what over-the-counter cold medicines you should avoid. Some medicines can make symptoms worse. General instructions  Ask for support from trusted family members or friends to make sure you stay on track with your treatment.  Keep a journal to write down your daily moods, medicines, sleep habits, and life events. This may help you have more success with your treatment.  Make and follow a routine for daily meal times. Eat healthy foods, such as whole grains, vegetables, and fresh fruit.  Try to go to sleep and wake up  around the same time every day.  Keep all follow-up visits as told by your health care provider. This is important. Questions to ask your health care provider:  If you are taking medicines: ? How long do I need to take medicine? ? Are there any long-term side effects of my medicine? ? Are there any alternatives to taking medicine?  How would I benefit from therapy?  How often should I follow up with a health care provider? Contact a health care provider if:  Your symptoms get worse or they do not get better with treatment. Get help right away if:  You have thoughts about harming yourself or others. If you ever feel like you may hurt yourself or others, or have thoughts about taking your own life, get help right away. You can go to your nearest emergency department or call:  Your local emergency services (911 in the [place]  A suicide crisis helpline, such as the [place] at 737-515-5040. This is open 24-hours a day.  Summary  Learning ways to deal with stress can help to calm you and may also help your treatment work better.  There is a wide range of medicines that can help to treat bipolar disorder.  Having healthy relationships can help to make your moods more stable.  Contact a health care provider if your symptoms get worse or they do not get better with treatment. This information is not intended to replace advice given to you by your health care provider. Make sure you discuss any questions you have with your health care provider. Document Released: 02/13/2017 Document Revised: 02/13/2017 Document Reviewed: 02/13/2017 Elsevier Interactive Patient Education  2018 [employer].  Bipolar 1 Disorder Bipolar 1 disorder is a mental health disorder in which a person has episodes of emotional highs (mania), and may also have episodes of emotional lows (depression) in addition to highs. Bipolar 1 disorder is different from other bipolar disorders  because it involves extreme manic episodes. These episodes last at least one week or involve symptoms that are so severe that hospitalization is needed to keep the person safe. What increases the risk? The cause of this condition is not known. However, certain factors make you more likely to have bipolar disorder, such as:  Having a family member with the disorder.  An imbalance of certain chemicals in the brain (neurotransmitters).  Stress, such as illness, financial problems, or a death.  Certain conditions that affect the brain or spinal cord (neurologic conditions).  Brain injury (trauma).  Having another mental health disorder, such as: ? Obsessive compulsive disorder. ? Schizophrenia.  What are the signs or symptoms? Symptoms of mania include:  Very high self-esteem or self-confidence.  Decreased need for sleep.  Unusual talkativeness or feeling a need to keep talking. Speech may be very fast. It may seem like you cannot stop talking.  Racing thoughts or constant talking, with quick shifts between topics that may or may not be related (flight of ideas).  Decreased ability to focus or concentrate.  Increased purposeful activity, such as work, studies, or social activity.  Increased nonproductive activity. This could be pacing, squirming and fidgeting, or finger and toe tapping.  Impulsive behavior and poor judgment. This may result in high-risk activities, such as having unprotected sex or spending a lot of money.  Symptoms of depression include:  Feeling sad, hopeless, or helpless.  Frequent or uncontrollable crying.  Lack of feeling or caring about anything.  Sleeping too much.  Moving more slowly than usual.  Not being able to enjoy things you used to enjoy.  Wanting to be alone all the time.  Feeling guilty or worthless.  Lack of energy or motivation.  Trouble concentrating or remembering.  Trouble making decisions.  Increased  appetite.  Thoughts of death, or the desire to harm yourself.  Sometimes, you may have a mixed mood. This means having symptoms of depression and mania. Stress can make symptoms worse. How is this diagnosed? To diagnose bipolar disorder, your health care provider may ask about your:  Emotional episodes.  Medical history.  Alcohol and drug use. This includes prescription medicines. Certain medical conditions and substances can cause symptoms that seem like bipolar disorder (secondary bipolar disorder).  How is this treated? Bipolar disorder is a long-term (chronic) illness. It is best controlled with ongoing (continuous) treatment rather than treatment only when symptoms occur. Treatment may include:  Medicine. Medicine can be prescribed by a provider who specializes in treating mental disorders (psychiatrist). ? Medicines called mood stabilizers are usually prescribed. ? If symptoms occur even while taking a mood stabilizer, other medicines may be added.  Psychotherapy. Some forms of  talk therapy, such as cognitive-behavioral therapy (CBT), can provide support, education, and guidance.  Coping methods, such as journaling or relaxation exercises. These may include: ? Yoga. ? Meditation. ? Deep breathing.  Lifestyle changes, such as: ? Limiting alcohol and drug use. ? Exercising regularly. ? Getting plenty of sleep. ? Making healthy eating choices.  A combination of medicine, talk therapy, and coping methods is best. A procedure in which electricity is applied to the brain through the scalp (electroconvulsive therapy) may be used in cases of severe mania when medicine and psychotherapy work too slowly or do not work. Follow these instructions at home: Activity   Return to your normal activities as told by your health care provider.  Find activities that you enjoy, and make time to do them.  Exercise regularly as told by your health care provider. Lifestyle  Limit alcohol  intake to no more than 1 drink a day for nonpregnant women and 2 drinks a day for men. One drink equals 12 oz of beer, 5 oz of wine, or 1 oz of hard liquor.  Follow a set schedule for eating and sleeping.  Eat a balanced diet that includes fresh fruits and vegetables, whole grains, low-fat dairy, and lean meat.  Get 7-8 hours of sleep each night. General instructions  Take over-the-counter and prescription medicines only as told by your health care provider.  Think about joining a support group. Your health care provider may be able to recommend a support group.  Talk with your family and loved ones about your treatment goals and how they can help.  Keep all follow-up visits as told by your health care provider. This is important. Where to find more information: For more information about bipolar disorder, visit the following websites:  [employer] on Mental Illness: www.nami.[place]: https://carter.com/  Contact a health care provider if:  Your symptoms get worse.  You have side effects from your medicine, and they get worse.  You have trouble sleeping.  You have trouble doing daily activities.  You feel unsafe in your surroundings.  You are dealing with substance abuse. Get help right away if:  You have new symptoms.  You have thoughts about harming yourself.  You self-harm. This information is not intended to replace advice given to you by your health care provider. Make sure you discuss any questions you have with your health care provider. Document Released: 01/20/2001 Document Revised: 06/09/2016 Document Reviewed: 06/13/2016 Elsevier Interactive Patient Education  [employer].

## 2017-12-15 ENCOUNTER — Encounter: Payer: Self-pay | Admitting: Internal Medicine

## 2017-12-15 ENCOUNTER — Ambulatory Visit (INDEPENDENT_AMBULATORY_CARE_PROVIDER_SITE_OTHER): Payer: BLUE CROSS/BLUE SHIELD | Admitting: Internal Medicine

## 2017-12-15 VITALS — BP 110/70 | HR 82 | Ht 72.0 in | Wt 294.0 lb

## 2017-12-15 DIAGNOSIS — I10 Essential (primary) hypertension: Secondary | ICD-10-CM

## 2017-12-15 DIAGNOSIS — I481 Persistent atrial fibrillation: Secondary | ICD-10-CM | POA: Diagnosis not present

## 2017-12-15 DIAGNOSIS — I4819 Other persistent atrial fibrillation: Secondary | ICD-10-CM

## 2017-12-15 MED ORDER — LOSARTAN POTASSIUM 50 MG PO TABS: 50.0000 mg | ORAL_TABLET | Freq: Every day | ORAL | 3 refills | Status: DC

## 2017-12-15 MED ORDER — FLECAINIDE ACETATE 50 MG PO TABS: 25.0000 mg | ORAL_TABLET | Freq: Two times a day (BID) | ORAL | 3 refills | Status: DC

## 2017-12-15 NOTE — Patient Instructions (Addendum)
Medication Instructions:  Your physician has recommended you make the following change in your medication:  1.  Corrected your med sheet to reflect you take flecainide 50 mg (1/2 tablet) by mouth twice a day. 2. Refilled your losartan 50 mg daily  Labwork: None ordered.  Testing/Procedures: None ordered.  Follow-Up: Your physician wants you to follow-up in: 6 months with Dr. Rayann Heman.   You will receive a reminder letter in the mail two months in advance. If you don't receive a letter, please call our office to schedule the follow-up appointment.  Any Other Special Instructions Will Be Listed Below (If Applicable).  If you need a refill on your cardiac medications before your next appointment, please call your pharmacy.

## 2017-12-15 NOTE — Progress Notes (Signed)
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 male who presents today for routine electrophysiology followup.  Since last being seen in our clinic, the patient reports doing very well.  Today, he denies symptoms of palpitations, chest pain, shortness of breath,  lower extremity edema, dizziness, presyncope, or syncope.  The patient is otherwise without complaint today.   Past Medical History:  Diagnosis Date  . Anxiety    when he was in A-fib, attributed to adrenal gland and fatigue  . Arthritis    legs, ankles  . GERD (gastroesophageal reflux disease)   . Hyperaldosteronism ([place])   . Hypertension   . Melanoma of lower back ([place]) 2000s  . Migraine    none for years  . Persistent atrial fibrillation ([place])   . Pneumonia    "a few times; last time ~ 2010" (04/03/2017)   Past Surgical History:  Procedure Laterality Date  . ATRIAL FIBRILLATION ABLATION N/A 04/03/2017   Procedure: Atrial Fibrillation Ablation;  Surgeon: Thompson Grayer, MD;  Location: [facility];  Service: Cardiovascular;  Laterality: N/A;  . CARDIOVERSION N/A 09/13/2016   Procedure: CARDIOVERSION;  Surgeon: Fay Records, MD;  Location: [place];  Service: Cardiovascular;  Laterality: N/A;  . CARDIOVERSION N/A 10/02/2016   Procedure: CARDIOVERSION;  Surgeon: Larey Dresser, MD;  Location: [place];  Service: Cardiovascular;  Laterality: N/A;  . CARDIOVERSION N/A 04/10/2017   Procedure: CARDIOVERSION;  Surgeon: Fay Records, MD;  Location: [facility];  Service: Cardiovascular;  Laterality: N/A;  . CARDIOVERSION N/A 04/28/2017   Procedure: CARDIOVERSION;  Surgeon: Pixie Casino, MD;  Location: [facility];  Service: Cardiovascular;  Laterality: N/A;  . HARDWARE REMOVAL Right 12/12/2015   Procedure: HARDWARE REMOVAL RIGHT TIBIAL;  Surgeon: Altamese Florence, MD;  Location: [place];  Service: Orthopedics;  Laterality: Right;  . IR US GUIDE VASC ACCESS RIGHT  07/16/2017  . IR  US GUIDE VASC ACCESS RIGHT  07/16/2017  . IR VENOGRAM ADRENAL BI  07/16/2017  . IR VENOGRAM RENAL UNI LEFT  07/16/2017  . IR VENOUS SAMPLING  07/16/2017  . IR VENOUS SAMPLING  07/16/2017  . JOINT REPLACEMENT    . LAPAROSCOPIC ADRENALECTOMY Left 09/16/2017  . LAPAROSCOPIC ADRENALECTOMY Left 09/16/2017   Procedure: LAPAROSCOPIC LEFT ADRENALECTOMY;  Surgeon: Armandina Gemma, MD;  Location: [place];  Service: General;  Laterality: Left;  Marland Kitchen MELANOMA EXCISION     "back"  . ORIF NONUNION / MALUNION TIBIAL FRACTURE Right 2005   "put plates & screws in"  . TOTAL KNEE ARTHROPLASTY Right 03/19/2016   Procedure: RIGHT TOTAL KNEE ARTHROPLASTY;  Surgeon: Paralee Cancel, MD;  [facility];  Service: Orthopedics;  Laterality: Right;  Marland Kitchen VASECTOMY      ROS- all systems are reviewed and negatives except as per HPI above  Current Outpatient Medications  Medication Sig Dispense Refill  . carvedilol (COREG) 25 MG tablet TAKE 1 TABLET (25 MG TOTAL) BY MOUTH 2 (TWO) TIMES DAILY. 180 tablet 3  . clonazePAM (KLONOPIN) 0.5 MG tablet Take 1 tablet (0.5 mg total) by mouth 2 (two) times daily as needed for anxiety. 60 tablet 2  . esomeprazole (NEXIUM) 20 MG capsule Take 20 mg by mouth daily.    . flecainide (TAMBOCOR) 50 MG tablet Take 1 tablet by mouth 2 (two) times daily.  3  . losartan (COZAAR) 50 MG tablet Take 1 tablet (50 mg total) by mouth daily. 30 tablet 0  No current facility-administered medications for this visit.     Physical Exam: Vitals:   12/15/17 1641  BP: 110/70  Pulse: 82  Weight: 294 lb (133.4 kg)  Height: 6' (1.829 m)    GEN- The patient is well appearing, alert and oriented x 3 today.   Head- normocephalic, atraumatic Eyes-  Sclera clear, conjunctiva pink Ears- hearing intact Oropharynx- clear Lungs- Clear to ausculation bilaterally, normal work of breathing Heart- Regular rate and rhythm, no murmurs, rubs or gallops, PMI not laterally displaced GI- soft, NT, ND, + BS Extremities- no  clubbing, cyanosis, or edema  EKG tracing ordered today is personally reviewed and shows sinus rhythm 82 bpm, otherwise normal ekg  Assessment and Plan:  1. Persistent afib Maintaining sinus rhythm post ablation He takes flecainide 25mg  BID.  I have encouraged him to discontinue this medicine.  He is reluctant but will try. chads2vasc score is 1.  He is no longer on anticoagulation  2. HTN Stable No change required today  Return to see me inj 6 months  Thompson Grayer MD, [facility] 12/15/2017 4:49 PM

## 2017-12-20 DIAGNOSIS — F4320 Adjustment disorder, unspecified: Secondary | ICD-10-CM | POA: Diagnosis not present

## 2017-12-23 ENCOUNTER — Ambulatory Visit (INDEPENDENT_AMBULATORY_CARE_PROVIDER_SITE_OTHER): Payer: BLUE CROSS/BLUE SHIELD | Admitting: Internal Medicine

## 2017-12-23 ENCOUNTER — Encounter: Payer: Self-pay | Admitting: Internal Medicine

## 2017-12-23 VITALS — BP 112/64 | HR 68 | Temp 98.2°F | Ht 69.0 in | Wt 295.0 lb

## 2017-12-23 DIAGNOSIS — Z96651 Presence of right artificial knee joint: Secondary | ICD-10-CM

## 2017-12-23 DIAGNOSIS — Z0000 Encounter for general adult medical examination without abnormal findings: Secondary | ICD-10-CM

## 2017-12-23 DIAGNOSIS — E2609 Other primary hyperaldosteronism: Secondary | ICD-10-CM

## 2017-12-23 DIAGNOSIS — E6609 Other obesity due to excess calories: Secondary | ICD-10-CM

## 2017-12-23 DIAGNOSIS — I480 Paroxysmal atrial fibrillation: Secondary | ICD-10-CM

## 2017-12-23 LAB — COMPREHENSIVE METABOLIC PANEL
ALBUMIN: 4.3 g/dL (ref 3.5–5.2)
ALK PHOS: 62 U/L (ref 39–117)
ALT: 24 U/L (ref 0–53)
AST: 14 U/L (ref 0–37)
BUN: 22 mg/dL (ref 6–23)
CO2: 30 mEq/L (ref 19–32)
Calcium: 9.5 mg/dL (ref 8.4–10.5)
Chloride: 105 mEq/L (ref 96–112)
Creatinine, Ser: 1.3 mg/dL (ref 0.40–1.50)
GFR: 63.67 mL/min (ref >60.00)
Glucose, Bld: 87 mg/dL (ref 70–99)
POTASSIUM: 4.6 meq/L (ref 3.5–5.1)
Sodium: 141 mEq/L (ref 135–145)
TOTAL PROTEIN: 6.8 g/dL (ref 6.0–8.3)
Total Bilirubin: 0.8 mg/dL (ref 0.2–1.2)

## 2017-12-23 LAB — LIPID PANEL
CHOLESTEROL: 172 mg/dL (ref 0–200)
HDL: 37.6 mg/dL — ABNORMAL LOW (ref >39.00)
LDL Cholesterol: 112 mg/dL — ABNORMAL HIGH (ref 0–99)
NonHDL: 133.99
Total CHOL/HDL Ratio: 5
Triglycerides: 110 mg/dL (ref 0.0–149.0)
VLDL: 22 mg/dL (ref 0.0–40.0)

## 2017-12-23 LAB — TSH: TSH: 1.29 u[IU]/mL (ref 0.35–4.50)

## 2017-12-23 NOTE — Progress Notes (Signed)
Subjective:    Patient ID: Gregory Barrett, male    DOB: 09/23/1971, 47 y.o.   MRN: [id number]  HPI  47 year old patient who is seen today for a health maintenance exam.  He is status post laparoscopic left adrenalectomy for primary hyperaldosteronism in November of last year. He has been followed by cardiology with persistent atrial fibrillation and has had EP studies performed with ablation in June 2018 He is status post right total knee replacement therapy  Family history Father history of bladder cancer Mother history of hypertension  Past Medical History:  Diagnosis Date  . Anxiety    when he was in A-fib, attributed to adrenal gland and fatigue  . Arthritis    legs, ankles  . GERD (gastroesophageal reflux disease)   . Hyperaldosteronism ([place])   . Hypertension   . Melanoma of lower back ([place]) 2000s  . Migraine    none for years  . Persistent atrial fibrillation ([place])   . Pneumonia    "a few times; last time ~ 2010" (04/03/2017)     Social History   Socioeconomic History  . Marital status: Married    Spouse name: Not on file  . Number of children: Not on file  . Years of education: Not on file  . Highest education level: Not on file  Social Needs  . Financial resource strain: Not on file  . Food insecurity - worry: Not on file  . Food insecurity - inability: Not on file  . Transportation needs - medical: Not on file  . Transportation needs - non-medical: Not on file  Occupational History  . Not on file  Tobacco Use  . Smoking status: Former Smoker    Packs/day: 0.50    Years: 2.00    Pack years: 1.00    Types: Cigarettes    Last attempt to quit: 09/20/2010    Years since quitting: 7.2  . Smokeless tobacco: Former Systems developer    Types: [place] date: 11/03/2014  Substance and Sexual Activity  . Alcohol use: Yes    Alcohol/week: 1.8 oz    Types: 3 Glasses of wine per week  . Drug use: No  . Sexual activity: Yes  Other Topics Concern  . Not on file    Social History Narrative  . Not on file    Past Surgical History:  Procedure Laterality Date  . ATRIAL FIBRILLATION ABLATION N/A 04/03/2017   Procedure: Atrial Fibrillation Ablation;  Surgeon: Thompson Grayer, MD;  Location: [facility];  Service: Cardiovascular;  Laterality: N/A;  . CARDIOVERSION N/A 09/13/2016   Procedure: CARDIOVERSION;  Surgeon: Fay Records, MD;  Location: [facility];  Service: Cardiovascular;  Laterality: N/A;  . CARDIOVERSION N/A 10/02/2016   Procedure: CARDIOVERSION;  Surgeon: Larey Dresser, MD;  Location: [place];  Service: Cardiovascular;  Laterality: N/A;  . CARDIOVERSION N/A 04/10/2017   Procedure: CARDIOVERSION;  Surgeon: Fay Records, MD;  Location: [facility];  Service: Cardiovascular;  Laterality: N/A;  . CARDIOVERSION N/A 04/28/2017   Procedure: CARDIOVERSION;  Surgeon: Pixie Casino, MD;  Location: [place];  Service: Cardiovascular;  Laterality: N/A;  . HARDWARE REMOVAL Right 12/12/2015   Procedure: HARDWARE REMOVAL RIGHT TIBIAL;  Surgeon: Altamese Lone Wolf, MD;  Location: [place];  Service: Orthopedics;  Laterality: Right;  . IR US GUIDE VASC ACCESS RIGHT  07/16/2017  . IR US GUIDE VASC ACCESS RIGHT  07/16/2017  . IR VENOGRAM ADRENAL BI  07/16/2017  . IR VENOGRAM  RENAL UNI LEFT  07/16/2017  . IR VENOUS SAMPLING  07/16/2017  . IR VENOUS SAMPLING  07/16/2017  . JOINT REPLACEMENT    . LAPAROSCOPIC ADRENALECTOMY Left 09/16/2017  . LAPAROSCOPIC ADRENALECTOMY Left 09/16/2017   Procedure: LAPAROSCOPIC LEFT ADRENALECTOMY;  Surgeon: Armandina Gemma, MD;  Location: [place];  Service: General;  Laterality: Left;  Marland Kitchen MELANOMA EXCISION     "back"  . ORIF NONUNION / MALUNION TIBIAL FRACTURE Right 2005   "put plates & screws in"  . TOTAL KNEE ARTHROPLASTY Right 03/19/2016   Procedure: RIGHT TOTAL KNEE ARTHROPLASTY;  Surgeon: Paralee Cancel, MD;  [facility];  Service: Orthopedics;  Laterality: Right;  Marland Kitchen VASECTOMY      Family History  Problem Relation Age  of Onset  . Meniere's disease Mother   . Bladder Cancer Father   . Hypertension Maternal Grandmother   . Cancer Maternal Grandfather        lung ca  . Adrenal disorder Neg Hx     No Known Allergies  Current Outpatient Medications on File Prior to Visit  Medication Sig Dispense Refill  . clonazePAM (KLONOPIN) 0.5 MG tablet Take 1 tablet (0.5 mg total) by mouth 2 (two) times daily as needed for anxiety. 60 tablet 2  . esomeprazole (NEXIUM) 20 MG capsule Take 20 mg by mouth daily.    . flecainide (TAMBOCOR) 50 MG tablet Take 0.5 tablets (25 mg total) by mouth 2 (two) times daily. 90 tablet 3  . losartan (COZAAR) 50 MG tablet Take 1 tablet (50 mg total) by mouth daily. 90 tablet 3  . carvedilol (COREG) 25 MG tablet TAKE 1 TABLET (25 MG TOTAL) BY MOUTH 2 (TWO) TIMES DAILY. 180 tablet 3   No current facility-administered medications on file prior to visit.     BP 112/64 (BP Location: Right Arm, Patient Position: Sitting, Cuff Size: Large)   Pulse 68   Temp 98.2 F (36.8 C) (Oral)   Ht 5\' 9"  (1.753 m)   Wt 295 lb (133.8 kg)   SpO2 97%   BMI 43.56 kg/m     Review of Systems  Constitutional: Negative for appetite change, chills, fatigue and fever.  HENT: Negative for congestion, dental problem, ear pain, hearing loss, sore throat, tinnitus, trouble swallowing and voice change.   Eyes: Negative for pain, discharge and visual disturbance.  Respiratory: Negative for cough, chest tightness, wheezing and stridor.   Cardiovascular: Negative for chest pain, palpitations and leg swelling.  Gastrointestinal: Negative for abdominal distention, abdominal pain, blood in stool, constipation, diarrhea, nausea and vomiting.  Genitourinary: Negative for difficulty urinating, discharge, flank pain, genital sores, hematuria and urgency.  Musculoskeletal: Negative for arthralgias, back pain, gait problem, joint swelling, myalgias and neck stiffness.  Skin: Negative for rash.  Neurological: Negative  for dizziness, syncope, speech difficulty, weakness, numbness and headaches.  Hematological: Negative for adenopathy. Does not bruise/bleed easily.  Psychiatric/Behavioral: Negative for behavioral problems and dysphoric mood. The patient is not nervous/anxious.        Objective:   Physical Exam  Constitutional: He appears well-developed and well-nourished.  Weight 295 Blood pressure low normal  HENT:  Head: Normocephalic and atraumatic.  Right Ear: External ear normal.  Left Ear: External ear normal.  Nose: Nose normal.  Mouth/Throat: Oropharynx is clear and moist.  Marked pharyngeal crowding  Eyes: Conjunctivae and EOM are normal. Pupils are equal, round, and reactive to light. No scleral icterus.  Neck: Normal range of motion. Neck supple. No JVD present. No thyromegaly present.  Cardiovascular: Regular rhythm, normal heart sounds and intact distal pulses. Exam reveals no gallop and no friction rub.  No murmur heard. Pulmonary/Chest: Effort normal and breath sounds normal. He exhibits no tenderness.  Abdominal: Soft. Bowel sounds are normal. He exhibits no distension and no mass. There is no tenderness.  Genitourinary: Prostate normal and penis normal.  Musculoskeletal: Normal range of motion. He exhibits no edema or tenderness.  Lymphadenopathy:    He has no cervical adenopathy.  Neurological: He is alert. He has normal reflexes. No cranial nerve deficit. Coordination normal.  Skin: Skin is warm and dry. No rash noted.  Psychiatric: He has a normal mood and affect. His behavior is normal.          Assessment & Plan:   Status post laparoscopic left adrenalectomy for primary hyperparathyroidism.  Blood pressure today low normal History of persistent atrial fibrillation.  Remains in normal sinus rhythm.  Follow-up cardiology as scheduled Obesity OSA suspect.  Discussed with patient no history of loud snoring with significant daytime sleepiness but does complain of some  chronic fatigue.  No prior sleep studies.  Weight loss encouraged Home blood pressure monitoring encouraged Placed on a DASH diet Follow-up 6 months or as needed Evaluate for OSA although symptoms seem minimal  [name]

## 2017-12-23 NOTE — Patient Instructions (Addendum)
Limit your sodium (Salt) intake  Please check your blood pressure on a regular basis.  If it is consistently greater than 150/90, please make an office appointment.    It is important that you exercise regularly, at least 20 minutes 3 to 4 times per week.  If you develop chest pain or shortness of breath seek  medical attention.  You need to lose weight.  Consider a lower calorie diet and regular exercise.   DASH Eating Plan DASH stands for "Dietary Approaches to Stop Hypertension." The DASH eating plan is a healthy eating plan that has been shown to reduce high blood pressure (hypertension). It may also reduce your risk for type 2 diabetes, heart disease, and stroke. The DASH eating plan may also help with weight loss. What are tips for following this plan? General guidelines  Avoid eating more than 2,300 mg (milligrams) of salt (sodium) a day. If you have hypertension, you may need to reduce your sodium intake to 1,500 mg a day.  Limit alcohol intake to no more than 1 drink a day for nonpregnant women and 2 drinks a day for men. One drink equals 12 oz of beer, 5 oz of wine, or 1 oz of hard liquor.  Work with your health care provider to maintain a healthy body weight or to lose weight. Ask what an ideal weight is for you.  Get at least 30 minutes of exercise that causes your heart to beat faster (aerobic exercise) most days of the week. Activities may include walking, swimming, or biking.  Work with your health care provider or diet and nutrition specialist (dietitian) to adjust your eating plan to your individual calorie needs. Reading food labels  Check food labels for the amount of sodium per serving. Choose foods with less than 5 percent of the Daily Value of sodium. Generally, foods with less than 300 mg of sodium per serving fit into this eating plan.  To find whole grains, look for the word "whole" as the first word in the ingredient list. Shopping  Buy products labeled as  "low-sodium" or "no salt added."  Buy fresh foods. Avoid canned foods and premade or frozen meals. Cooking  Avoid adding salt when cooking. Use salt-free seasonings or herbs instead of table salt or sea salt. Check with your health care provider or pharmacist before using salt substitutes.  Do not fry foods. Cook foods using healthy methods such as baking, boiling, grilling, and broiling instead.  Cook with heart-healthy oils, such as olive, canola, soybean, or sunflower oil. Meal planning   Eat a balanced diet that includes: ? 5 or more servings of fruits and vegetables each day. At each meal, try to fill half of your plate with fruits and vegetables. ? Up to 6-8 servings of whole grains each day. ? Less than 6 oz of lean meat, poultry, or fish each day. A 3-oz serving of meat is about the same size as a deck of cards. One egg equals 1 oz. ? 2 servings of low-fat dairy each day. ? A serving of nuts, seeds, or beans 5 times each week. ? Heart-healthy fats. Healthy fats called Omega-3 fatty acids are found in foods such as flaxseeds and coldwater fish, like sardines, salmon, and mackerel.  Limit how much you eat of the following: ? Canned or prepackaged foods. ? Food that is high in trans fat, such as fried foods. ? Food that is high in saturated fat, such as fatty meat. ? Sweets, desserts, sugary drinks,  and other foods with added sugar. ? Full-fat dairy products.  Do not salt foods before eating.  Try to eat at least 2 vegetarian meals each week.  Eat more home-cooked food and less restaurant, buffet, and fast food.  When eating at a restaurant, ask that your food be prepared with less salt or no salt, if possible. What foods are recommended? The items listed may not be a complete list. Talk with your dietitian about what dietary choices are best for you. Grains Whole-grain or whole-wheat bread. Whole-grain or whole-wheat pasta. Brown rice. [place]. Bulgur. Whole-grain  and low-sodium cereals. Pita bread. Low-fat, low-sodium crackers. Whole-wheat flour tortillas. Vegetables Fresh or frozen vegetables (raw, steamed, roasted, or grilled). Low-sodium or reduced-sodium tomato and vegetable juice. Low-sodium or reduced-sodium tomato sauce and tomato paste. Low-sodium or reduced-sodium canned vegetables. Fruits All fresh, dried, or frozen fruit. Canned fruit in natural juice (without added sugar). Meat and other protein foods Skinless chicken or [place]. Ground chicken or [place]. Pork with fat trimmed off. Fish and seafood. Egg whites. Dried beans, peas, or lentils. Unsalted nuts, nut butters, and seeds. Unsalted canned beans. Lean cuts of beef with fat trimmed off. Low-sodium, lean deli meat. Dairy Low-fat (1%) or fat-free (skim) milk. Fat-free, low-fat, or reduced-fat cheeses. Nonfat, low-sodium ricotta or cottage cheese. Low-fat or nonfat yogurt. Low-fat, low-sodium cheese. Fats and oils Soft margarine without trans fats. Vegetable oil. Low-fat, reduced-fat, or light mayonnaise and salad dressings (reduced-sodium). Canola, safflower, olive, soybean, and sunflower oils. Avocado. Seasoning and other foods Herbs. Spices. Seasoning mixes without salt. Unsalted popcorn and pretzels. Fat-free sweets. What foods are not recommended? The items listed may not be a complete list. Talk with your dietitian about what dietary choices are best for you. Grains Baked goods made with fat, such as croissants, muffins, or some breads. Dry pasta or rice meal packs. Vegetables Creamed or fried vegetables. Vegetables in a cheese sauce. Regular canned vegetables (not low-sodium or reduced-sodium). Regular canned tomato sauce and paste (not low-sodium or reduced-sodium). Regular tomato and vegetable juice (not low-sodium or reduced-sodium). Angie Fava. Olives. Fruits Canned fruit in a light or heavy syrup. Fried fruit. Fruit in cream or butter sauce. Meat and other protein foods Fatty cuts  of meat. Ribs. Fried meat. Berniece Salines. Sausage. Bologna and other processed lunch meats. Salami. Fatback. Hotdogs. Bratwurst. Salted nuts and seeds. Canned beans with added salt. Canned or smoked fish. Whole eggs or egg yolks. Chicken or [place] with skin. Dairy Whole or 2% milk, cream, and half-and-half. Whole or full-fat cream cheese. Whole-fat or sweetened yogurt. Full-fat cheese. Nondairy creamers. Whipped toppings. Processed cheese and cheese spreads. Fats and oils Butter. Stick margarine. Lard. Shortening. Ghee. Bacon fat. Tropical oils, such as coconut, palm kernel, or palm oil. Seasoning and other foods Salted popcorn and pretzels. Onion salt, garlic salt, seasoned salt, table salt, and sea salt. Worcestershire sauce. Tartar sauce. Barbecue sauce. Teriyaki sauce. Soy sauce, including reduced-sodium. Steak sauce. Canned and packaged gravies. Fish sauce. Oyster sauce. Cocktail sauce. Horseradish that you find on the shelf. Ketchup. Mustard. Meat flavorings and tenderizers. Bouillon cubes. Hot sauce and Tabasco sauce. Premade or packaged marinades. Premade or packaged taco seasonings. Relishes. Regular salad dressings. Where to find more information:  National Heart, Lung, and [place]: https://wilson-eaton.com/  American Heart Association: www.heart.org Summary  The DASH eating plan is a healthy eating plan that has been shown to reduce high blood pressure (hypertension). It may also reduce your risk for type 2 diabetes, heart disease, and stroke.  With the DASH eating plan, you should limit salt (sodium) intake to 2,300 mg a day. If you have hypertension, you may need to reduce your sodium intake to 1,500 mg a day.  When on the DASH eating plan, aim to eat more fresh fruits and vegetables, whole grains, lean proteins, low-fat dairy, and heart-healthy fats.  Work with your health care provider or diet and nutrition specialist (dietitian) to adjust your eating plan to your individual calorie  needs. This information is not intended to replace advice given to you by your health care provider. Make sure you discuss any questions you have with your health care provider. Document Released: 10/03/2011 Document Revised: 10/07/2016 Document Reviewed: 10/07/2016 Elsevier Interactive Patient Education  [employer].

## 2017-12-31 DIAGNOSIS — B349 Viral infection, unspecified: Secondary | ICD-10-CM | POA: Diagnosis not present

## 2017-12-31 DIAGNOSIS — R509 Fever, unspecified: Secondary | ICD-10-CM | POA: Diagnosis not present

## 2018-01-26 ENCOUNTER — Encounter (HOSPITAL_COMMUNITY): Payer: Self-pay | Admitting: Nurse Practitioner

## 2018-01-26 ENCOUNTER — Ambulatory Visit (HOSPITAL_COMMUNITY)
Admission: RE | Admit: 2018-01-26 | Discharge: 2018-01-26 | Disposition: A | Discharge status: Home / Self Care | Payer: BLUE CROSS/BLUE SHIELD | Source: Ambulatory Visit | Attending: Nurse Practitioner | Admitting: Nurse Practitioner

## 2018-01-26 VITALS — BP 118/82 | HR 139 | Ht 69.0 in | Wt 296.4 lb

## 2018-01-26 DIAGNOSIS — I480 Paroxysmal atrial fibrillation: Secondary | ICD-10-CM | POA: Insufficient documentation

## 2018-01-26 DIAGNOSIS — Z87891 Personal history of nicotine dependence: Secondary | ICD-10-CM | POA: Diagnosis not present

## 2018-01-26 DIAGNOSIS — I10 Essential (primary) hypertension: Secondary | ICD-10-CM | POA: Diagnosis not present

## 2018-01-26 DIAGNOSIS — F419 Anxiety disorder, unspecified: Secondary | ICD-10-CM | POA: Diagnosis not present

## 2018-01-26 DIAGNOSIS — K219 Gastro-esophageal reflux disease without esophagitis: Secondary | ICD-10-CM | POA: Diagnosis not present

## 2018-01-26 DIAGNOSIS — Z7901 Long term (current) use of anticoagulants: Secondary | ICD-10-CM | POA: Diagnosis not present

## 2018-01-26 DIAGNOSIS — Z79899 Other long term (current) drug therapy: Secondary | ICD-10-CM | POA: Insufficient documentation

## 2018-01-26 DIAGNOSIS — Z9889 Other specified postprocedural states: Secondary | ICD-10-CM | POA: Diagnosis not present

## 2018-01-26 LAB — CBC
HCT: 44.7 % (ref 39.0–52.0)
HEMOGLOBIN: 15.4 g/dL (ref 13.0–17.0)
MCH: 31.2 pg (ref 26.0–34.0)
MCHC: 34.5 g/dL (ref 30.0–36.0)
MCV: 90.7 fL (ref 78.0–100.0)
Platelets: 293 10*3/uL (ref 150–400)
RBC: 4.93 MIL/uL (ref 4.22–5.81)
RDW: 13.1 % (ref 11.5–15.5)
WBC: 9.3 10*3/uL (ref 4.0–10.5)

## 2018-01-26 LAB — BASIC METABOLIC PANEL
Anion gap: 11 (ref 5–15)
BUN: 13 mg/dL (ref 6–20)
CHLORIDE: 106 mmol/L (ref 101–111)
CO2: 24 mmol/L (ref 22–32)
CREATININE: 1.19 mg/dL (ref 0.61–1.24)
Calcium: 9.3 mg/dL (ref 8.9–10.3)
GFR calc non Af Amer: >60 mL/min (ref >60)
Glucose, Bld: 85 mg/dL (ref 65–99)
POTASSIUM: 4.2 mmol/L (ref 3.5–5.1)
Sodium: 141 mmol/L (ref 135–145)

## 2018-01-26 MED ORDER — RIVAROXABAN 20 MG PO TABS: 20.0000 mg | ORAL_TABLET | Freq: Every day | ORAL | Status: DC

## 2018-01-26 NOTE — Patient Instructions (Addendum)
Increase flecainide to 100mg  twice a day. Resume Xarelto 20mg  once a day with supper.  Cardioversion scheduled for Friday, April 5th  - Arrive at the [facility] and go to admitting at 11:30AM  -Do not eat or drink anything after midnight the night prior to your procedure.  - Take all your medication with a sip of water prior to arrival.  - You will not be able to drive home after your procedure.

## 2018-01-26 NOTE — Progress Notes (Signed)
Primary Care Physician: Marletta Lor, MD Referring Physician: self referred EP: Gregory Barrett is a 47 y.o. male with a h/o afib, s/p ablation 03/2017. He started having staggering afib this weekend and feels he woke up in  persistent afib this am. He has been off xarelto for some time for chadsvasc score of 1. He has continued flecainide 50 mg bid, although Dr. Rayann Heman said that he could stop drug if he wished, as he was staying in [place]. He knows no trigger for the afib.  Today, he denies symptoms of chest pain, shortness of breath, orthopnea, PND, lower extremity edema, dizziness, presyncope, syncope, or neurologic sequela. + for palpitations and fatigue The patient is tolerating medications without difficulties and is otherwise without complaint today.   Past Medical History:  Diagnosis Date  . Anxiety    when he was in A-fib, attributed to adrenal gland and fatigue  . Arthritis    legs, ankles  . GERD (gastroesophageal reflux disease)   . Hyperaldosteronism ([place])   . Hypertension   . Melanoma of lower back ([place]) 2000s  . Migraine    none for years  . Persistent atrial fibrillation ([place])   . Pneumonia    "a few times; last time ~ 2010" (04/03/2017)   Past Surgical History:  Procedure Laterality Date  . ATRIAL FIBRILLATION ABLATION N/A 04/03/2017   Procedure: Atrial Fibrillation Ablation;  Surgeon: Thompson Grayer, MD;  Location: [facility];  Service: Cardiovascular;  Laterality: N/A;  . CARDIOVERSION N/A 09/13/2016   Procedure: CARDIOVERSION;  Surgeon: Fay Records, MD;  Location: [facility];  Service: Cardiovascular;  Laterality: N/A;  . CARDIOVERSION N/A 10/02/2016   Procedure: CARDIOVERSION;  Surgeon: Larey Dresser, MD;  Location: [facility];  Service: Cardiovascular;  Laterality: N/A;  . CARDIOVERSION N/A 04/10/2017   Procedure: CARDIOVERSION;  Surgeon: Fay Records, MD;  Location: [facility];  Service: Cardiovascular;  Laterality: N/A;  .  CARDIOVERSION N/A 04/28/2017   Procedure: CARDIOVERSION;  Surgeon: Pixie Casino, MD;  Location: [facility];  Service: Cardiovascular;  Laterality: N/A;  . HARDWARE REMOVAL Right 12/12/2015   Procedure: HARDWARE REMOVAL RIGHT TIBIAL;  Surgeon: Altamese [place], MD;  Location: [facility];  Service: Orthopedics;  Laterality: Right;  . IR US GUIDE VASC ACCESS RIGHT  07/16/2017  . IR US GUIDE VASC ACCESS RIGHT  07/16/2017  . IR VENOGRAM ADRENAL BI  07/16/2017  . IR VENOGRAM RENAL UNI LEFT  07/16/2017  . IR VENOUS SAMPLING  07/16/2017  . IR VENOUS SAMPLING  07/16/2017  . JOINT REPLACEMENT    . LAPAROSCOPIC ADRENALECTOMY Left 09/16/2017  . LAPAROSCOPIC ADRENALECTOMY Left 09/16/2017   Procedure: LAPAROSCOPIC LEFT ADRENALECTOMY;  Surgeon: Armandina Gemma, MD;  Location: [facility];  Service: General;  Laterality: Left;  Marland Kitchen MELANOMA EXCISION     "back"  . ORIF NONUNION / MALUNION TIBIAL FRACTURE Right 2005   "put plates & screws in"  . TOTAL KNEE ARTHROPLASTY Right 03/19/2016   Procedure: RIGHT TOTAL KNEE ARTHROPLASTY;  Surgeon: Paralee Cancel, MD;  [facility];  Service: Orthopedics;  Laterality: Right;  Marland Kitchen VASECTOMY      Current Outpatient Medications  Medication Sig Dispense Refill  . carvedilol (COREG) 25 MG tablet TAKE 1 TABLET (25 MG TOTAL) BY MOUTH 2 (TWO) TIMES DAILY. 180 tablet 3  . clonazePAM (KLONOPIN) 0.5 MG tablet Take 1 tablet (0.5 mg total) by mouth 2 (two) times daily as needed for anxiety. 60 tablet 2  .  esomeprazole (NEXIUM) 20 MG capsule Take 20 mg by mouth daily.    . flecainide (TAMBOCOR) 100 MG tablet Take 100 mg by mouth 2 (two) times daily.    Marland Kitchen losartan (COZAAR) 50 MG tablet Take 1 tablet (50 mg total) by mouth daily. 90 tablet 3  . rivaroxaban (XARELTO) 20 MG TABS tablet Take 1 tablet (20 mg total) by mouth daily with supper. 30 tablet    No current facility-administered medications for this encounter.     No Known Allergies  Social History   Socioeconomic History  . Marital  status: Married    Spouse name: Not on file  . Number of children: Not on file  . Years of education: Not on file  . Highest education level: Not on file  Occupational History  . Not on file  Social Needs  . Financial resource strain: Not on file  . Food insecurity:    Worry: Not on file    Inability: Not on file  . Transportation needs:    Medical: Not on file    Non-medical: Not on file  Tobacco Use  . Smoking status: Former Smoker    Packs/day: 0.50    Years: 2.00    Pack years: 1.00    Types: Cigarettes    Last attempt to quit: 09/20/2010    Years since quitting: 7.3  . Smokeless tobacco: Former Systems developer    Types: [place] date: 11/03/2014  Substance and Sexual Activity  . Alcohol use: Yes    Alcohol/week: 1.8 oz    Types: 3 Glasses of wine per week  . Drug use: No  . Sexual activity: Yes  Lifestyle  . Physical activity:    Days per week: Not on file    Minutes per session: Not on file  . Stress: Not on file  Relationships  . Social connections:    Talks on phone: Not on file    Gets together: Not on file    Attends religious service: Not on file    Active member of club or organization: Not on file    Attends meetings of clubs or organizations: Not on file    Relationship status: Not on file  . Intimate partner violence:    Fear of current or ex partner: Not on file    Emotionally abused: Not on file    Physically abused: Not on file    Forced sexual activity: Not on file  Other Topics Concern  . Not on file  Social History Narrative  . Not on file    Family History  Problem Relation Age of Onset  . Meniere's disease Mother   . Bladder Cancer Father   . Hypertension Maternal Grandmother   . Cancer Maternal Grandfather        lung ca  . Adrenal disorder Neg Hx     ROS- All systems are reviewed and negative except as per the HPI above  Physical Exam: Vitals:   01/26/18 1454  BP: 118/82  Pulse: (!) 139  Weight: 296 lb 6.4 oz (134.4 kg)    Height: 5\' 9"  (1.753 m)   Wt Readings from Last 3 Encounters:  01/26/18 296 lb 6.4 oz (134.4 kg)  12/23/17 295 lb (133.8 kg)  12/15/17 294 lb (133.4 kg)    Labs: Lab Results  Component Value Date   NA 141 12/23/2017   K 4.6 12/23/2017   CL 105 12/23/2017   CO2 30 12/23/2017   GLUCOSE 87 12/23/2017  BUN 22 12/23/2017   CREATININE 1.30 12/23/2017   CALCIUM 9.5 12/23/2017   MG 1.9 02/14/2017   Lab Results  Component Value Date   INR 0.90 07/16/2017   Lab Results  Component Value Date   CHOL 172 12/23/2017   HDL 37.60 (L) 12/23/2017   LDLCALC 112 (H) 12/23/2017   TRIG 110.0 12/23/2017     GEN- The patient is well appearing, alert and oriented x 3 today.   Head- normocephalic, atraumatic Eyes-  Sclera clear, conjunctiva pink Ears- hearing intact Oropharynx- clear Neck- supple, no JVP Lymph- no cervical lymphadenopathy Lungs- Clear to ausculation bilaterally, normal work of breathing Heart- rapid, irregular rate and rhythm, no murmurs, rubs or gallops, PMI not laterally displaced GI- soft, NT, ND, + BS Extremities- no clubbing, cyanosis, or edema MS- no significant deformity or atrophy Skin- no rash or lesion Psych- euthymic mood, full affect Neuro- strength and sensation are intact  EKG- Afib with v rate of 139 bpm, qrs int 92 ms, qtc 465 ms    Assessment and Plan: 1. Paroxysmal symptomatic afib Increase flecainide to 100 mg bid Continue carvedilol at 25 mg bid Can use as needed 30 mg cardizem for HR over 482 and if Systolic BP is over 707  Start back xarelto 20 mg at supper tonight Will plan for TEE guided  cardioversion if remains in afib for Friday, 4/5  F/u in afib clinic in one week after cardioversion  Gregory Barrett, [facility] [address] [place], [place] [address] [phone]

## 2018-01-27 ENCOUNTER — Ambulatory Visit (HOSPITAL_COMMUNITY)
Admission: RE | Admit: 2018-01-27 | Discharge: 2018-01-27 | Disposition: A | Discharge status: Home / Self Care | Payer: BLUE CROSS/BLUE SHIELD | Source: Ambulatory Visit | Attending: Nurse Practitioner | Admitting: Nurse Practitioner

## 2018-01-27 DIAGNOSIS — Z79899 Other long term (current) drug therapy: Secondary | ICD-10-CM | POA: Diagnosis not present

## 2018-01-27 DIAGNOSIS — I480 Paroxysmal atrial fibrillation: Secondary | ICD-10-CM | POA: Insufficient documentation

## 2018-01-27 NOTE — Progress Notes (Addendum)
Pt in for repeat EKG. To be reviewed by Roderic Palau, NP  Pt has returned to SR with  increase of flecainide to 100 mg bid. Would advise to continue this dose x one month and then can decrease to 50 mg bid. He will continue his DOAC x one week, chadsvasc score of 1, then can stop as before.

## 2018-01-30 ENCOUNTER — Ambulatory Visit (HOSPITAL_COMMUNITY)
Admission: RE | Admit: 2018-01-30 | Payer: BLUE CROSS/BLUE SHIELD | Source: Ambulatory Visit | Admitting: Internal Medicine

## 2018-01-30 ENCOUNTER — Encounter (HOSPITAL_COMMUNITY): Admission: RE | Payer: Self-pay | Source: Ambulatory Visit

## 2018-01-30 SURGERY — CARDIOVERSION
Anesthesia: General

## 2018-02-06 ENCOUNTER — Ambulatory Visit (HOSPITAL_COMMUNITY): Payer: BLUE CROSS/BLUE SHIELD | Admitting: Nurse Practitioner

## 2018-03-17 ENCOUNTER — Encounter: Payer: Self-pay | Admitting: Internal Medicine

## 2018-04-08 ENCOUNTER — Encounter: Payer: Self-pay | Admitting: Family Medicine

## 2018-04-08 ENCOUNTER — Ambulatory Visit (INDEPENDENT_AMBULATORY_CARE_PROVIDER_SITE_OTHER): Payer: BLUE CROSS/BLUE SHIELD | Admitting: Family Medicine

## 2018-04-08 VITALS — BP 102/80 | HR 90 | Temp 98.0°F | Wt 307.4 lb

## 2018-04-08 DIAGNOSIS — R21 Rash and other nonspecific skin eruption: Secondary | ICD-10-CM | POA: Diagnosis not present

## 2018-04-08 MED ORDER — PREDNISONE 10 MG PO TABS: ORAL_TABLET | ORAL | 0 refills | Status: DC

## 2018-04-08 NOTE — Patient Instructions (Signed)
Let me know if rash not clearing on the prednisone.

## 2018-04-08 NOTE — Progress Notes (Signed)
Subjective:     Patient ID: Gregory Barrett, male   DOB: 08/27/1971, 47 y.o.   MRN: [id number]  HPI Patient seen with pruritic skin rash present for the past week. Involvement includes legs, arms and initially trunk and anterior chest though chest has cleared somewhat. No recent change of soap or detergent. He did use a new sunblock about a week ago prior to starting. No known history of food allergy. No angioedema symptoms such as tongue or lip swelling.   Patient has tried some Benadryl and topical hydrocortisone cream with minimal relief. No recent fevers or chills. No headaches.  Past Medical History:  Diagnosis Date  . Anxiety    when he was in A-fib, attributed to adrenal gland and fatigue  . Arthritis    legs, ankles  . GERD (gastroesophageal reflux disease)   . Hyperaldosteronism ([place])   . Hypertension   . Melanoma of lower back ([place]) 2000s  . Migraine    none for years  . Persistent atrial fibrillation ([place])   . Pneumonia    "a few times; last time ~ 2010" (04/03/2017)   Past Surgical History:  Procedure Laterality Date  . ATRIAL FIBRILLATION ABLATION N/A 04/03/2017   Procedure: Atrial Fibrillation Ablation;  Surgeon: Thompson Grayer, MD;  Location: [facility];  Service: Cardiovascular;  Laterality: N/A;  . CARDIOVERSION N/A 09/13/2016   Procedure: CARDIOVERSION;  Surgeon: Fay Records, MD;  Location: [facility];  Service: Cardiovascular;  Laterality: N/A;  . CARDIOVERSION N/A 10/02/2016   Procedure: CARDIOVERSION;  Surgeon: Larey Dresser, MD;  Location: [facility];  Service: Cardiovascular;  Laterality: N/A;  . CARDIOVERSION N/A 04/10/2017   Procedure: CARDIOVERSION;  Surgeon: Fay Records, MD;  Location: [facility];  Service: Cardiovascular;  Laterality: N/A;  . CARDIOVERSION N/A 04/28/2017   Procedure: CARDIOVERSION;  Surgeon: Pixie Casino, MD;  Location: [facility];  Service: Cardiovascular;  Laterality: N/A;  . HARDWARE REMOVAL Right 12/12/2015   Procedure: HARDWARE REMOVAL RIGHT TIBIAL;  Surgeon: Altamese [place], MD;  Location: [facility];  Service: Orthopedics;  Laterality: Right;  . IR US GUIDE VASC ACCESS RIGHT  07/16/2017  . IR US GUIDE VASC ACCESS RIGHT  07/16/2017  . IR VENOGRAM ADRENAL BI  07/16/2017  . IR VENOGRAM RENAL UNI LEFT  07/16/2017  . IR VENOUS SAMPLING  07/16/2017  . IR VENOUS SAMPLING  07/16/2017  . JOINT REPLACEMENT    . LAPAROSCOPIC ADRENALECTOMY Left 09/16/2017  . LAPAROSCOPIC ADRENALECTOMY Left 09/16/2017   Procedure: LAPAROSCOPIC LEFT ADRENALECTOMY;  Surgeon: Armandina Gemma, MD;  Location: [facility];  Service: General;  Laterality: Left;  Marland Kitchen MELANOMA EXCISION     "back"  . ORIF NONUNION / MALUNION TIBIAL FRACTURE Right 2005   "put plates & screws in"  . TOTAL KNEE ARTHROPLASTY Right 03/19/2016   Procedure: RIGHT TOTAL KNEE ARTHROPLASTY;  Surgeon: Paralee Cancel, MD;  [facility];  Service: Orthopedics;  Laterality: Right;  Marland Kitchen VASECTOMY      reports that he quit smoking about 7 years ago. His smoking use included cigarettes. He has a 1.00 pack-year smoking history. He quit smokeless tobacco use about 3 years ago. His smokeless tobacco use included chew. He reports that he drinks about 1.8 oz of alcohol per week. He reports that he does not use drugs. family history includes Bladder Cancer in his father; Cancer in his maternal grandfather; Hypertension in his maternal grandmother; Meniere's disease in his mother. No Known Allergies     Review of Systems  Constitutional: Negative for chills and fever.  HENT: Negative for sore throat.   Respiratory: Negative for cough, shortness of breath and wheezing.   Cardiovascular: Negative for chest pain.  Skin: Positive for rash.  Hematological: Negative for adenopathy.       Objective:   Physical Exam  Constitutional: He appears well-developed and well-nourished.  Cardiovascular: Normal rate and regular rhythm.  No murmur heard. Pulmonary/Chest: Effort normal and breath  sounds normal. He has no wheezes. He has no rales.  Skin: Rash noted.  Patient has rash predominantly on his legs forearms and arms bilaterally with fairly symmetric distribution. Erythematous slightly raised somewhat urticarial. No pustules. No vesicles. Nonscaly. Blanches with pressure.       Assessment:     Diffuse rash extremities which appears to be allergic in nature. Question related to recent change in sunblock    Plan:     -Leave off new sunblock -Recommended prednisone taper starting at 60 mg daily. Touch base if rash not resolving over the next several days  Eulas Post MD [facility]

## 2018-06-10 ENCOUNTER — Other Ambulatory Visit: Payer: Self-pay | Admitting: Internal Medicine

## 2018-06-11 NOTE — Telephone Encounter (Signed)
Pt needs to schedule an ov for more refills. Tried calling pt vm is full.

## 2018-07-30 ENCOUNTER — Other Ambulatory Visit: Payer: Self-pay | Admitting: Internal Medicine

## 2018-09-28 ENCOUNTER — Other Ambulatory Visit: Payer: Self-pay | Admitting: Internal Medicine

## 2018-09-28 MED ORDER — LOSARTAN POTASSIUM 50 MG PO TABS: 50.0000 mg | ORAL_TABLET | Freq: Every day | ORAL | 0 refills | Status: DC

## 2018-09-29 ENCOUNTER — Telehealth: Payer: Self-pay | Admitting: Internal Medicine

## 2018-09-29 MED ORDER — LOSARTAN POTASSIUM 25 MG PO TABS: 50.0000 mg | ORAL_TABLET | Freq: Every day | ORAL | 0 refills | Status: DC

## 2018-09-29 NOTE — Telephone Encounter (Signed)
[facility] is stating that Losartan 50 mg tablet is on backorder. Would like an alternative sent in, they have Losartan 25 mg and Losartan 100 mg tablets available. Please address

## 2018-09-29 NOTE — Telephone Encounter (Signed)
Order changed to losartan 25 mg--Take 2 tablets by mouth daily.  Changed d/t 50 mg losartan on back order.

## 2018-11-21 ENCOUNTER — Other Ambulatory Visit: Payer: Self-pay | Admitting: Cardiology

## 2018-11-23 ENCOUNTER — Other Ambulatory Visit: Payer: Self-pay | Admitting: Internal Medicine

## 2018-11-23 MED ORDER — FLECAINIDE ACETATE 100 MG PO TABS: 100.0000 mg | ORAL_TABLET | Freq: Two times a day (BID) | ORAL | 0 refills | Status: DC

## 2018-11-23 NOTE — Telephone Encounter (Signed)
Pt's medication was sent to pt's pharmacy as requested. Confirmation received.  °

## 2018-11-24 ENCOUNTER — Other Ambulatory Visit: Payer: Self-pay | Admitting: Internal Medicine

## 2018-11-28 IMAGING — CT CT ABDOMEN WO/W CM
3 of 12 series · 12 of 46 positions shown, 18 images · IV contrast (ISOVUE 300)
Comparison: No priors.

CLINICAL DATA: 45-year-old male with history of atrial
fibrillation. Low potassium and calcium levels. Evaluate for primary
aldosteronism.

EXAM:
CT ABDOMEN WITHOUT AND WITH CONTRAST
TECHNIQUE: Multidetector CT imaging of the abdomen was performed following the
standard protocol before and following the bolus administration of
intravenous contrast.
CONTRAST:  100 mL of Csovue-YYY.

[Series 5: abdomen · axial · 0.88mm/px · z∈[-305,-95]mm · 6 of 98 slices shown, 11 images]
[im 14/98  soft-tissue]
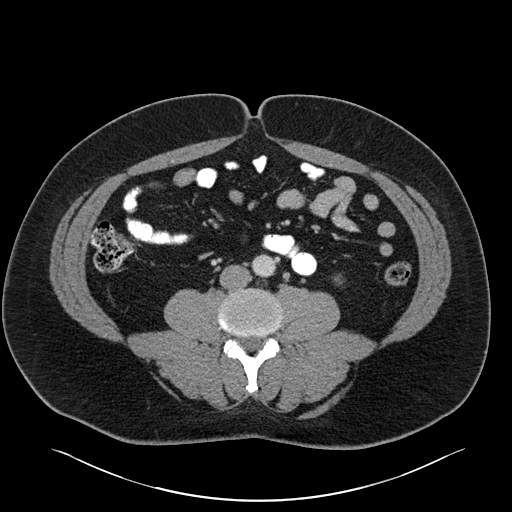
[im 14/98  bone]
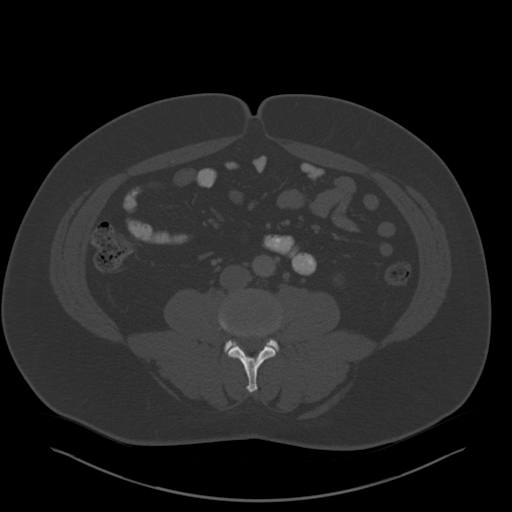
[im 28/98  soft-tissue]
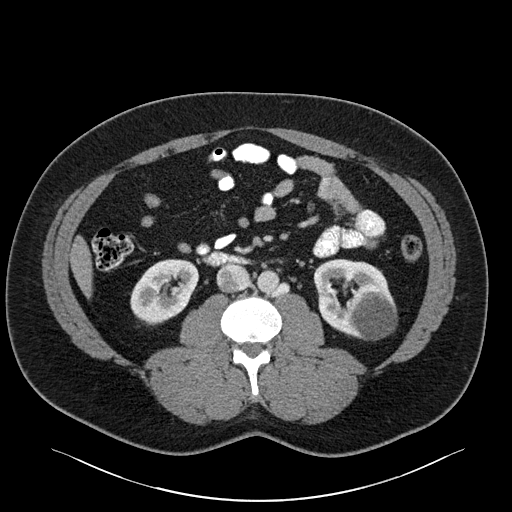
[im 42/98  soft-tissue]
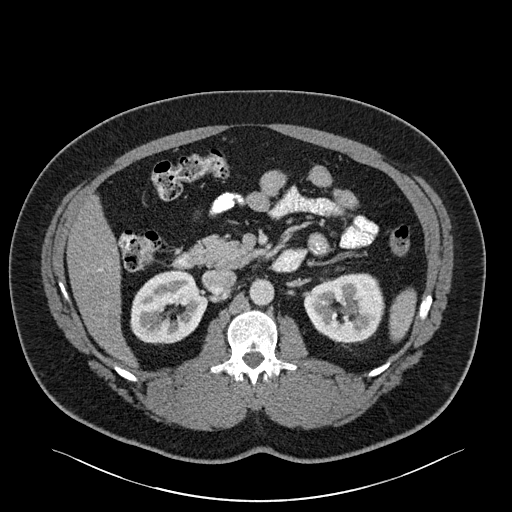
[im 42/98  lung]
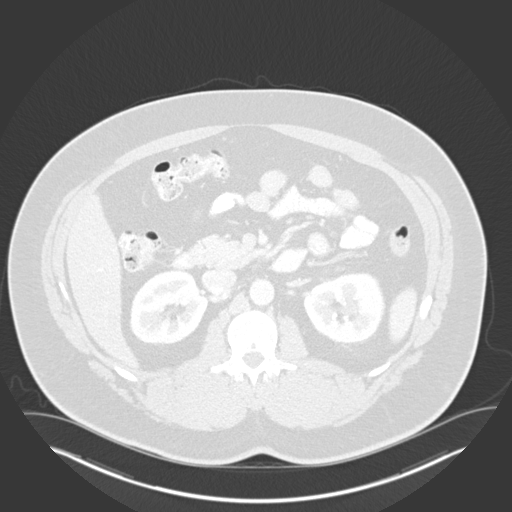
[im 56/98  soft-tissue]
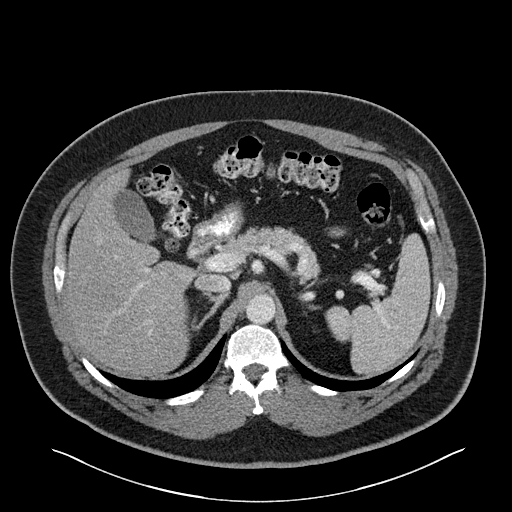
[im 56/98  lung]
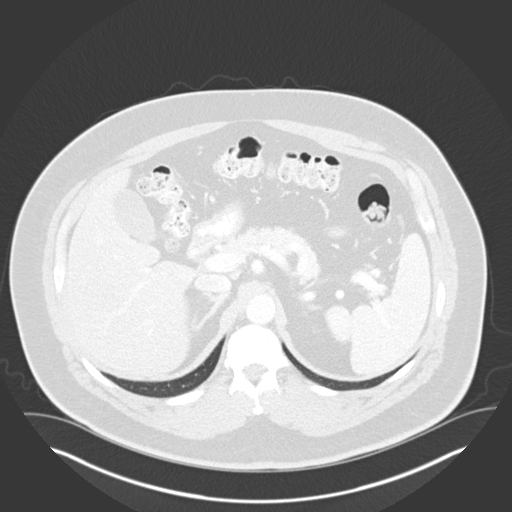
[im 70/98  soft-tissue]
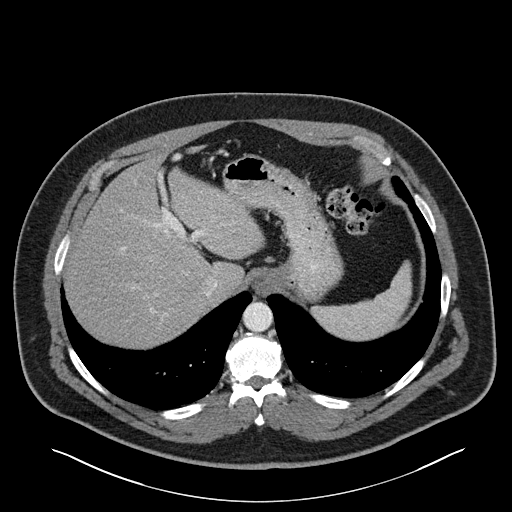
[im 70/98  lung]
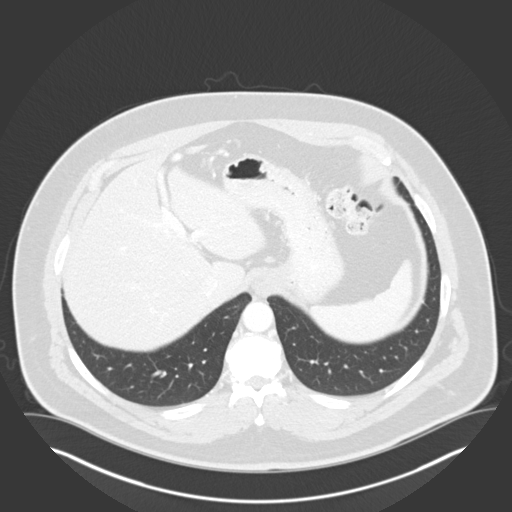
[im 84/98  soft-tissue]
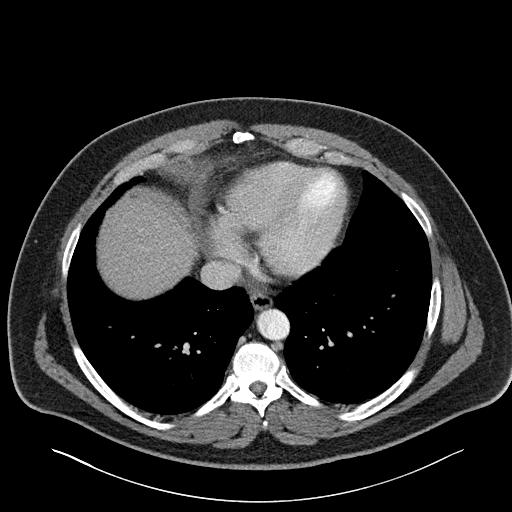
[im 84/98  lung]
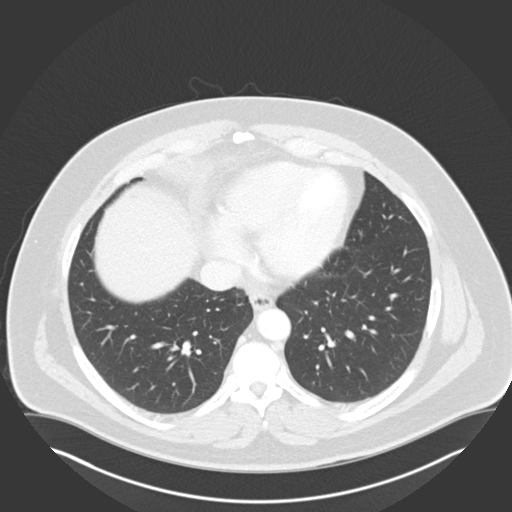

[Series 8: abd delay 15 min · axial · delayed · 0.90mm/px · z∈[-303,-177]mm · 4 of 98 slices shown]
[im 14/98  soft-tissue]
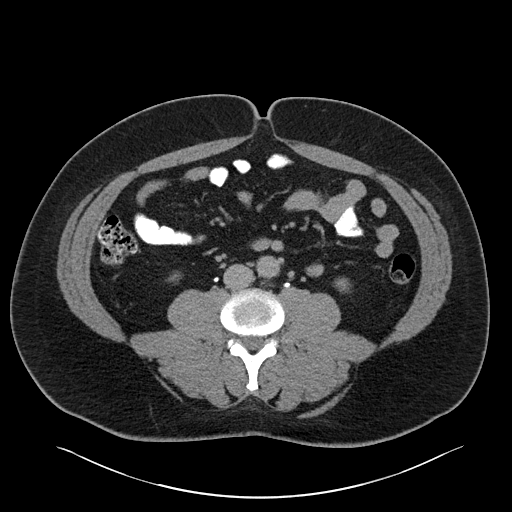
[im 28/98  soft-tissue]
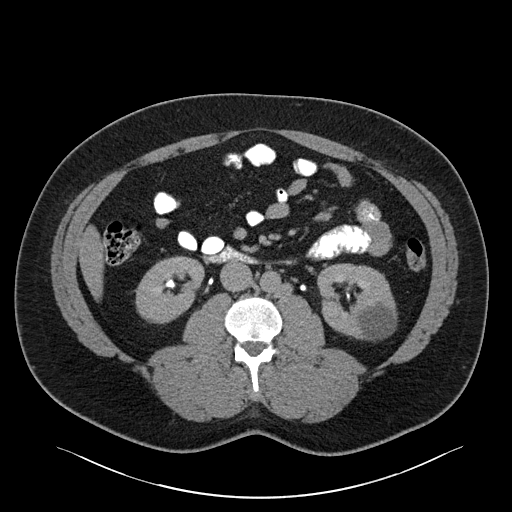
[im 42/98  soft-tissue]
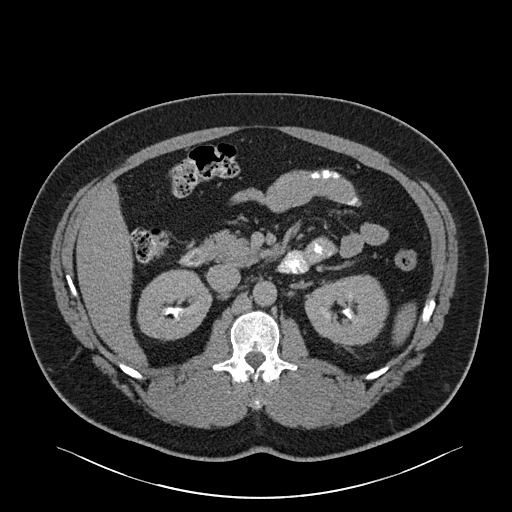
[im 56/98  soft-tissue]
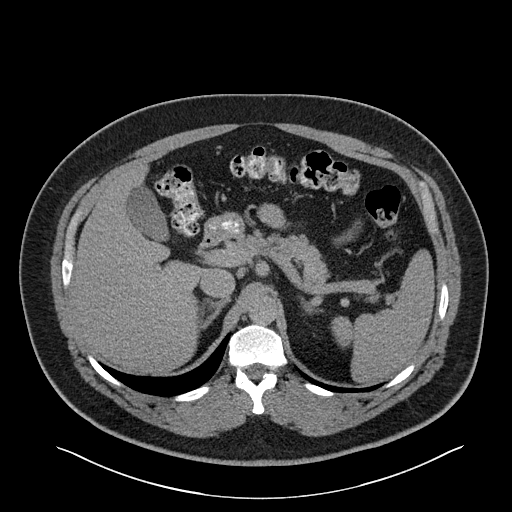

[Series 11: coronal wo · coronal · 0.57mm/px · 2 of 157 slices shown, 3 images]
[im 53/157  soft-tissue]
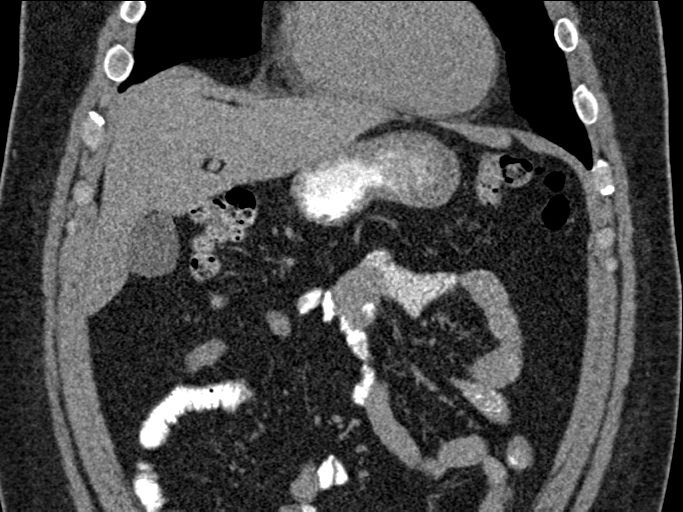
[im 53/157  bone]
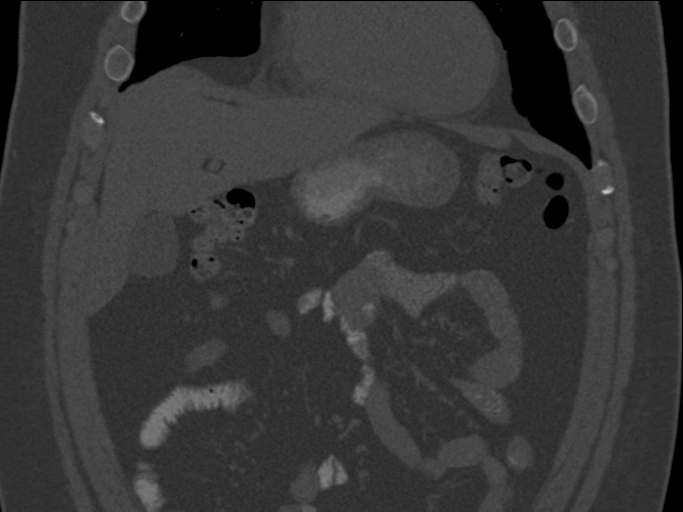
[im 105/157  soft-tissue]
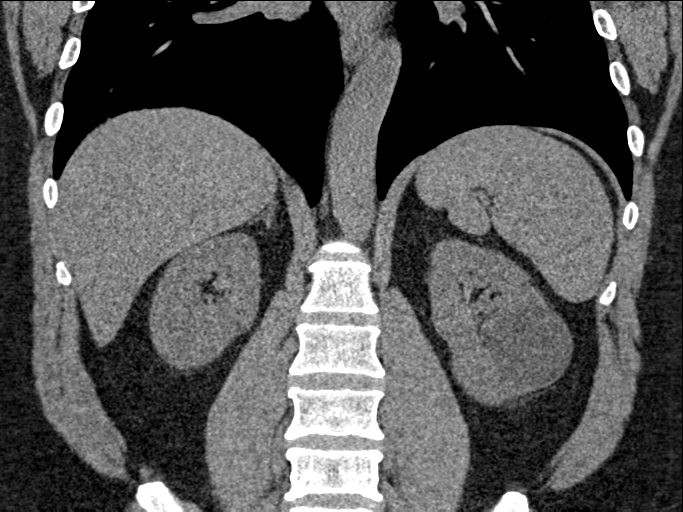

[12 of 46 positions shown; findings below may reference images not displayed]

FINDINGS: Lower chest: Unremarkable.

Hepatobiliary: No cystic or solid hepatic lesions. No intra or
extrahepatic biliary ductal dilatation. Gallbladder is normal in
appearance.

Pancreas: No pancreatic mass. No pancreatic ductal dilatation. No
pancreatic or peripancreatic fluid or inflammatory changes.

Spleen: Unremarkable.

Adrenals/Urinary Tract: Bilateral adrenal glands are normal in
appearance. Specifically, no adrenal nodule or mass. Right kidney is
normal in appearance. 5.5 cm simple cyst in the interpolar region of
the left kidney. No hydroureteronephrosis in the visualized portions
of the abdomen.

Stomach/Bowel: Normal appearance of the stomach. No pathologic
dilatation of visualized portions of small bowel or colon.

Vascular/Lymphatic: No significant atherosclerotic disease, aneurysm
or dissection identified in the abdominal vasculature. Retroaortic
left renal vein (normal anatomical variant) incidentally noted. No
lymphadenopathy noted in the abdomen.

Other: No significant volume of ascites and no pneumoperitoneum
noted in the visualized peritoneal cavity.

Musculoskeletal: There are no aggressive appearing lytic or blastic
lesions noted in the visualized portions of the skeleton.
IMPRESSION: 1. Bilateral adrenal glands are normal in appearance.
2. 5.5 cm simple cyst in the interpolar region of the left kidney.

## 2018-12-21 ENCOUNTER — Other Ambulatory Visit: Payer: Self-pay | Admitting: Internal Medicine

## 2019-01-31 ENCOUNTER — Encounter: Payer: Self-pay | Admitting: Family Medicine

## 2019-02-01 ENCOUNTER — Other Ambulatory Visit: Payer: Self-pay

## 2019-02-01 ENCOUNTER — Ambulatory Visit (INDEPENDENT_AMBULATORY_CARE_PROVIDER_SITE_OTHER): Payer: BLUE CROSS/BLUE SHIELD | Admitting: Family Medicine

## 2019-02-01 ENCOUNTER — Encounter: Payer: Self-pay | Admitting: Family Medicine

## 2019-02-01 DIAGNOSIS — M109 Gout, unspecified: Secondary | ICD-10-CM | POA: Diagnosis not present

## 2019-02-01 MED ORDER — INDOMETHACIN 50 MG PO CAPS: 50.0000 mg | ORAL_CAPSULE | Freq: Three times a day (TID) | ORAL | 0 refills | Status: DC

## 2019-02-01 NOTE — Progress Notes (Signed)
Patient ID: Gregory Barrett, male   DOB: August 03, 1971, 48 y.o.   MRN: [id number]  Virtual Visit via Video Note  I connected with Gregory Barrett on 02/01/19 at  2:30 PM EDT by a video enabled telemedicine application and verified that I am speaking with the correct person using two identifiers.  Location patient: home Location provider:work or home office Persons participating in the virtual visit: patient, provider  I discussed the limitations of evaluation and management by telemedicine and the availability of in person appointments. The patient expressed understanding and agreed to proceed.   HPI: Patient called earlier today with acute pain and swelling left ankle.  He noticed some heat starting couple days ago.  He has had previous gout involving the same ankle and also left elbow.  Infrequent attacks usually about 1/year.  He states this is very similar to previous gout flareups.  No significant erythema.  No fevers or chills.  Difficulty with ambulating today.  He had called requesting refills of indomethacin.  He has no history of peptic ulcer disease.  No recent injury.  He does have history of atrial fibrillation and has had previous ablation.  He remains on carvedilol.  His blood pressures been stable.  He is not aware of any food triggers for his gout.  No regular alcohol use.   ROS: See pertinent positives and negatives per HPI.  Past Medical History:  Diagnosis Date  . Anxiety    when he was in A-fib, attributed to adrenal gland and fatigue  . Arthritis    legs, ankles  . GERD (gastroesophageal reflux disease)   . Hyperaldosteronism ([place])   . Hypertension   . Melanoma of lower back ([place]) 2000s  . Migraine    none for years  . Persistent atrial fibrillation ([place])   . Pneumonia    "a few times; last time ~ 2010" (04/03/2017)    Past Surgical History:  Procedure Laterality Date  . ATRIAL FIBRILLATION ABLATION N/A 04/03/2017   Procedure: Atrial Fibrillation Ablation;  Surgeon:  Thompson Grayer, MD;  Location: [facility];  Service: Cardiovascular;  Laterality: N/A;  . CARDIOVERSION N/A 09/13/2016   Procedure: CARDIOVERSION;  Surgeon: Fay Records, MD;  Location: [facility];  Service: Cardiovascular;  Laterality: N/A;  . CARDIOVERSION N/A 10/02/2016   Procedure: CARDIOVERSION;  Surgeon: Larey Dresser, MD;  Location: [facility];  Service: Cardiovascular;  Laterality: N/A;  . CARDIOVERSION N/A 04/10/2017   Procedure: CARDIOVERSION;  Surgeon: Fay Records, MD;  Location: [facility];  Service: Cardiovascular;  Laterality: N/A;  . CARDIOVERSION N/A 04/28/2017   Procedure: CARDIOVERSION;  Surgeon: Pixie Casino, MD;  Location: [facility];  Service: Cardiovascular;  Laterality: N/A;  . HARDWARE REMOVAL Right 12/12/2015   Procedure: HARDWARE REMOVAL RIGHT TIBIAL;  Surgeon: Altamese Ferrum, MD;  Location: [facility];  Service: Orthopedics;  Laterality: Right;  . IR US GUIDE VASC ACCESS RIGHT  07/16/2017  . IR US GUIDE VASC ACCESS RIGHT  07/16/2017  . IR VENOGRAM ADRENAL BI  07/16/2017  . IR VENOGRAM RENAL UNI LEFT  07/16/2017  . IR VENOUS SAMPLING  07/16/2017  . IR VENOUS SAMPLING  07/16/2017  . JOINT REPLACEMENT    . LAPAROSCOPIC ADRENALECTOMY Left 09/16/2017  . LAPAROSCOPIC ADRENALECTOMY Left 09/16/2017   Procedure: LAPAROSCOPIC LEFT ADRENALECTOMY;  Surgeon: Armandina Gemma, MD;  Location: [facility];  Service: General;  Laterality: Left;  Marland Kitchen MELANOMA EXCISION     "back"  . ORIF NONUNION / MALUNION TIBIAL FRACTURE Right  2005   "put plates & screws in"  . TOTAL KNEE ARTHROPLASTY Right 03/19/2016   Procedure: RIGHT TOTAL KNEE ARTHROPLASTY;  Surgeon: Paralee Cancel, MD;  [facility];  Service: Orthopedics;  Laterality: Right;  Marland Kitchen VASECTOMY      Family History  Problem Relation Age of Onset  . Meniere's disease Mother   . Bladder Cancer Father   . Hypertension Maternal Grandmother   . Cancer Maternal Grandfather        lung ca  . Adrenal disorder Neg Hx     SOCIAL HX:  Former smoker.  Quit 2011.  He works for [employer].  Still going into work regularly.   Current Outpatient Medications:  .  carvedilol (COREG) 25 MG tablet, TAKE 1 TABLET BY MOUTH 2 (TWO) TIMES DAILY., Disp: 180 tablet, Rfl: 2 .  clonazePAM (KLONOPIN) 0.5 MG tablet, Take 1 tablet (0.5 mg total) by mouth 2 (two) times daily as needed for anxiety., Disp: 60 tablet, Rfl: 2 .  esomeprazole (NEXIUM) 20 MG capsule, Take 20 mg by mouth daily., Disp: , Rfl:  .  flecainide (TAMBOCOR) 100 MG tablet, TAKE 1 TABLET BY MOUTH TWICE DAILY, Disp: 60 tablet, Rfl: 0 .  indomethacin (INDOCIN) 50 MG capsule, Take 1 capsule (50 mg total) by mouth 3 (three) times daily with meals., Disp: 30 capsule, Rfl: 0  EXAM:  VITALS per patient if applicable:  GENERAL: alert, oriented, appears well and in no acute distress  HEENT: atraumatic, conjunttiva clear, no obvious abnormalities on inspection of external nose and ears  NECK: normal movements of the head and neck  LUNGS: on inspection no signs of respiratory distress, breathing rate appears normal, no obvious gross SOB, gasping or wheezing  CV: no obvious cyanosis  MS: moves all visible extremities without noticeable abnormality  PSYCH/NEURO: pleasant and cooperative, no obvious depression or anxiety, speech and thought processing grossly intact  ASSESSMENT AND PLAN:  Discussed the following assessment and plan:  Acute gout of left ankle, unspecified cause  -Refilled indomethacin 50 mg every 8 hours as needed with food.  We discussed potential risk and potential side effects. -Discussed dietary triggers for gout. -Touch base in 2 to 3 days if not improving -Briefly discuss prophylaxis but he only has about 1 flareup every 1 to 2 years and is not interested at this time     I discussed the assessment and treatment plan with the patient. The patient was provided an opportunity to ask questions and all were answered. The patient agreed with the plan and  demonstrated an understanding of the instructions.   The patient was advised to call back or seek an in-person evaluation if the symptoms worsen or if the condition fails to improve as anticipated.  Carolann Littler, MD

## 2019-02-01 NOTE — Telephone Encounter (Signed)
Please advise 

## 2019-03-03 ENCOUNTER — Telehealth: Payer: Self-pay

## 2019-03-03 ENCOUNTER — Telehealth: Payer: Self-pay | Admitting: Internal Medicine

## 2019-03-03 NOTE — Telephone Encounter (Signed)
Please see messages. Please advise. °

## 2019-03-03 NOTE — Telephone Encounter (Signed)
error 

## 2019-03-03 NOTE — Telephone Encounter (Signed)
-----   Message from Beverlee Nims sent at 03/03/2019  3:51 PM EDT ----- Regarding: TOC The patient would like to [facility] to Dr. Elease Hashimoto. He was recently exposed to COVID-19 and would like to get an antio-bodi done.

## 2019-03-03 NOTE — Telephone Encounter (Signed)
I will be happy to see him.  I would confirm how long ago exposure was.  May take several days for IgG antibody to convert.

## 2019-03-05 ENCOUNTER — Ambulatory Visit (INDEPENDENT_AMBULATORY_CARE_PROVIDER_SITE_OTHER): Payer: BLUE CROSS/BLUE SHIELD | Admitting: Family Medicine

## 2019-03-05 ENCOUNTER — Other Ambulatory Visit: Payer: Self-pay

## 2019-03-05 DIAGNOSIS — Z20828 Contact with and (suspected) exposure to other viral communicable diseases: Secondary | ICD-10-CM | POA: Diagnosis not present

## 2019-03-05 DIAGNOSIS — I480 Paroxysmal atrial fibrillation: Secondary | ICD-10-CM

## 2019-03-05 DIAGNOSIS — Z20822 Contact with and (suspected) exposure to covid-19: Secondary | ICD-10-CM

## 2019-03-05 DIAGNOSIS — M109 Gout, unspecified: Secondary | ICD-10-CM | POA: Diagnosis not present

## 2019-03-05 NOTE — Progress Notes (Signed)
Patient ID: Gregory Barrett, male   DOB: Jul 27, 1971, 48 y.o.   MRN: [id number]  This visit type was conducted due to national recommendations for restrictions regarding the COVID-19 pandemic in an effort to limit this patient's exposure and mitigate transmission in our community.    Virtual Visit via Video Note  I connected with Gregory Barrett on 03/05/19 at  9:30 AM EDT by a video enabled telemedicine application and verified that I am speaking with the correct person using two identifiers.  Location patient: home Location provider:work or home office Persons participating in the virtual visit: patient, provider  I discussed the limitations of evaluation and management by telemedicine and the availability of in person appointments. The patient expressed understanding and agreed to proceed.   HPI: Patient is transitioning care to me.  He has history of intermittent atrial fibrillation, history of primary hyperaldosteronism, and history of gout.  He had recent gout flareup and after just 2 days of indomethacin his symptoms had resolved.  No flareup since then.  Only about 1 gout flareup per year.  He had recent episode of transient atrial fibrillation.  He took one 30 mg diltiazem and symptoms resolved.  He is on carvedilol at baseline.  Recent tapered off flecainide.  His major concern is that he works around [employer] and 1 of them this past Tuesday developed respiratory symptoms and tested positive for COVID-19.  He was sent home and told to quarantine.  Apparently, his human resource contact stated that if he had negative testing he could come back to work earlier.  He has no symptoms at this time.  He has no cough, fever, or dyspnea.  He is concerned because of his underlying health issue with A. fib.  Overall though fairly low risk   ROS: See pertinent positives and negatives per HPI.  Past Medical History:  Diagnosis Date  . Anxiety    when he was in A-fib, attributed to adrenal  gland and fatigue  . Arthritis    legs, ankles  . GERD (gastroesophageal reflux disease)   . Hyperaldosteronism ([place])   . Hypertension   . Melanoma of lower back (Gregory Barrett) 2000s  . Migraine    none for years  . Persistent atrial fibrillation (Gregory Barrett)   . Pneumonia    "a few times; last time ~ 2010" (04/03/2017)    Past Surgical History:  Procedure Laterality Date  . ATRIAL FIBRILLATION ABLATION N/A 04/03/2017   Procedure: Atrial Fibrillation Ablation;  Surgeon: Thompson Grayer, MD;  Location: [place] [facility];  Service: Cardiovascular;  Laterality: N/A;  . CARDIOVERSION N/A 09/13/2016   Procedure: CARDIOVERSION;  Surgeon: Fay Records, MD;  Location: [place];  Service: Cardiovascular;  Laterality: N/A;  . CARDIOVERSION N/A 10/02/2016   Procedure: CARDIOVERSION;  Surgeon: Larey Dresser, MD;  Location: [place];  Service: Cardiovascular;  Laterality: N/A;  . CARDIOVERSION N/A 04/10/2017   Procedure: CARDIOVERSION;  Surgeon: Fay Records, MD;  Location: [place];  Service: Cardiovascular;  Laterality: N/A;  . CARDIOVERSION N/A 04/28/2017   Procedure: CARDIOVERSION;  Surgeon: Pixie Casino, MD;  Location: [place];  Service: Cardiovascular;  Laterality: N/A;  . HARDWARE REMOVAL Right 12/12/2015   Procedure: HARDWARE REMOVAL RIGHT TIBIAL;  Surgeon: Altamese Silesia, MD;  Location: [place];  Service: Orthopedics;  Laterality: Right;  . IR US GUIDE VASC ACCESS RIGHT  07/16/2017  . IR US GUIDE VASC ACCESS RIGHT  07/16/2017  . IR VENOGRAM ADRENAL BI  07/16/2017  .  IR VENOGRAM RENAL UNI LEFT  07/16/2017  . IR VENOUS SAMPLING  07/16/2017  . IR VENOUS SAMPLING  07/16/2017  . JOINT REPLACEMENT    . LAPAROSCOPIC ADRENALECTOMY Left 09/16/2017  . LAPAROSCOPIC ADRENALECTOMY Left 09/16/2017   Procedure: LAPAROSCOPIC LEFT ADRENALECTOMY;  Surgeon: Armandina Gemma, MD;  Location: [place];  Service: General;  Laterality: Left;  Marland Kitchen MELANOMA EXCISION     "back"  . ORIF NONUNION / MALUNION TIBIAL FRACTURE Right  2005   "put plates & screws in"  . TOTAL KNEE ARTHROPLASTY Right 03/19/2016   Procedure: RIGHT TOTAL KNEE ARTHROPLASTY;  Surgeon: Paralee Cancel, MD;  [facility];  Service: Orthopedics;  Laterality: Right;  Marland Kitchen VASECTOMY      Family History  Problem Relation Age of Onset  . Meniere's disease Mother   . Bladder Cancer Father   . Hypertension Maternal Grandmother   . Cancer Maternal Grandfather        lung ca  . Adrenal disorder Neg Hx     SOCIAL HX: Quit smoking 2011   Current Outpatient Medications:  .  carvedilol (COREG) 25 MG tablet, TAKE 1 TABLET BY MOUTH 2 (TWO) TIMES DAILY., Disp: 180 tablet, Rfl: 2 .  clonazePAM (KLONOPIN) 0.5 MG tablet, Take 1 tablet (0.5 mg total) by mouth 2 (two) times daily as needed for anxiety., Disp: 60 tablet, Rfl: 2 .  esomeprazole (NEXIUM) 20 MG capsule, Take 20 mg by mouth daily., Disp: , Rfl:  .  indomethacin (INDOCIN) 50 MG capsule, Take 1 capsule (50 mg total) by mouth 3 (three) times daily with meals., Disp: 30 capsule, Rfl: 0  EXAM:  VITALS per patient if applicable:  GENERAL: alert, oriented, appears well and in no acute distress  HEENT: atraumatic, conjunttiva clear, no obvious abnormalities on inspection of external nose and ears  NECK: normal movements of the head and neck  LUNGS: on inspection no signs of respiratory distress, breathing rate appears normal, no obvious gross SOB, gasping or wheezing  CV: no obvious cyanosis  MS: moves all visible extremities without noticeable abnormality  PSYCH/NEURO: pleasant and cooperative, no obvious depression or anxiety, speech and thought processing grossly intact  ASSESSMENT AND PLAN:  Discussed the following assessment and plan:  #1 history of gout currently stable -Patient has infrequent flareups and not interested in prophylaxis at this time  #2 recent exposure to someone who had COVID-19 -He had several questions regarding testing.  We have explained that antibody testing  would be of no use at this time since his exposures only 3 days ago.  We also discussed that PCR testing is limited availability and since he is asymptomatic and lower risk doubt he will be a candidate to get this anywhere locally. -We have recommended continued quarantine and reviewed CDC guidelines regarding quarantine for people with exposure who are asymptomatic -Follow-up for any cough, fever, dyspnea  #3 intermittent atrial fibrillation-currently stable on carvedilol     I discussed the assessment and treatment plan with the patient. The patient was provided an opportunity to ask questions and all were answered. The patient agreed with the plan and demonstrated an understanding of the instructions.   The patient was advised to call back or seek an in-person evaluation if the symptoms worsen or if the condition fails to improve as anticipated.   Carolann Littler, MD

## 2019-04-27 ENCOUNTER — Ambulatory Visit (INDEPENDENT_AMBULATORY_CARE_PROVIDER_SITE_OTHER): Payer: BC Managed Care – PPO

## 2019-04-27 ENCOUNTER — Encounter: Payer: Self-pay | Admitting: Family Medicine

## 2019-04-27 ENCOUNTER — Other Ambulatory Visit: Payer: Self-pay

## 2019-04-27 ENCOUNTER — Ambulatory Visit (INDEPENDENT_AMBULATORY_CARE_PROVIDER_SITE_OTHER): Payer: BC Managed Care – PPO | Admitting: Family Medicine

## 2019-04-27 VITALS — BP 148/70 | HR 118 | Temp 98.1°F | Ht 72.0 in | Wt 292.8 lb

## 2019-04-27 DIAGNOSIS — M25572 Pain in left ankle and joints of left foot: Secondary | ICD-10-CM

## 2019-04-27 DIAGNOSIS — M7989 Other specified soft tissue disorders: Secondary | ICD-10-CM | POA: Diagnosis not present

## 2019-04-27 MED ORDER — DICLOFENAC SODIUM 1 % TD GEL: 4.0000 g | Freq: Four times a day (QID) | TRANSDERMAL | 2 refills | Status: DC

## 2019-04-27 NOTE — Progress Notes (Signed)
Subjective:     Patient ID: Gregory Barrett, male   DOB: Apr 29, 1971, 48 y.o.   MRN: [id number]  HPI Patient seen for recurrent left ankle pain.  We did a virtual visit back in April for acute inflammatory change of the ankle with suspected gout.  He had had a similar occurrence previously.  We refilled indomethacin and after several days this did improve.  However, he has had now several weeks of recurrent pain mostly involving the lateral left ankle.  Denies any recent injury.  His job requires walking on hard surfaces up to 8 to 11 miles per day.  No sense of instability.  Denies recurrent ankle sprain  He has reported history of gout involving the left elbow but has never had crystal analysis.  Suspected gout in the left ankle.  Uric acid was 8.3 back in 2018.  Past Medical History:  Diagnosis Date  . Anxiety    when he was in A-fib, attributed to adrenal gland and fatigue  . Arthritis    legs, ankles  . GERD (gastroesophageal reflux disease)   . Hyperaldosteronism ([place])   . Hypertension   . Melanoma of lower back ([place]) 2000s  . Migraine    none for years  . Persistent atrial fibrillation   . Pneumonia    "a few times; last time ~ 2010" (04/03/2017)   Past Surgical History:  Procedure Laterality Date  . ATRIAL FIBRILLATION ABLATION N/A 04/03/2017   Procedure: Atrial Fibrillation Ablation;  Surgeon: Thompson Grayer, MD;  Location: [place] [facility];  Service: Cardiovascular;  Laterality: N/A;  . CARDIOVERSION N/A 09/13/2016   Procedure: CARDIOVERSION;  Surgeon: Fay Records, MD;  Location: [place];  Service: Cardiovascular;  Laterality: N/A;  . CARDIOVERSION N/A 10/02/2016   Procedure: CARDIOVERSION;  Surgeon: Larey Dresser, MD;  Location: [place];  Service: Cardiovascular;  Laterality: N/A;  . CARDIOVERSION N/A 04/10/2017   Procedure: CARDIOVERSION;  Surgeon: Fay Records, MD;  Location: [place];  Service: Cardiovascular;  Laterality: N/A;  . CARDIOVERSION N/A  04/28/2017   Procedure: CARDIOVERSION;  Surgeon: Pixie Casino, MD;  Location: [place];  Service: Cardiovascular;  Laterality: N/A;  . HARDWARE REMOVAL Right 12/12/2015   Procedure: HARDWARE REMOVAL RIGHT TIBIAL;  Surgeon: Altamese [place]ville, MD;  Location: [place];  Service: Orthopedics;  Laterality: Right;  . IR US GUIDE VASC ACCESS RIGHT  07/16/2017  . IR US GUIDE VASC ACCESS RIGHT  07/16/2017  . IR VENOGRAM ADRENAL BI  07/16/2017  . IR VENOGRAM RENAL UNI LEFT  07/16/2017  . IR VENOUS SAMPLING  07/16/2017  . IR VENOUS SAMPLING  07/16/2017  . JOINT REPLACEMENT    . LAPAROSCOPIC ADRENALECTOMY Left 09/16/2017  . LAPAROSCOPIC ADRENALECTOMY Left 09/16/2017   Procedure: LAPAROSCOPIC LEFT ADRENALECTOMY;  Surgeon: Armandina Gemma, MD;  Location: [place];  Service: General;  Laterality: Left;  Marland Kitchen MELANOMA EXCISION     "back"  . ORIF NONUNION / MALUNION TIBIAL FRACTURE Right 2005   "put plates & screws in"  . TOTAL KNEE ARTHROPLASTY Right 03/19/2016   Procedure: RIGHT TOTAL KNEE ARTHROPLASTY;  Surgeon: Paralee Cancel, MD;  [facility];  Service: Orthopedics;  Laterality: Right;  Marland Kitchen VASECTOMY      reports that he quit smoking about 8 years ago. His smoking use included cigarettes. He has a 1.00 pack-year smoking history. He quit smokeless tobacco use about 4 years ago.  His smokeless tobacco use included chew. He reports current alcohol use of about 3.0 standard  drinks of alcohol per week. He reports that he does not use drugs. family history includes Bladder Cancer in his father; Cancer in his maternal grandfather; Hypertension in his maternal grandmother; Meniere's disease in his mother. No Known Allergies   Review of Systems  Constitutional: Negative for chills and fever.  Hematological: Negative for adenopathy.       Objective:   Physical Exam Constitutional:      Appearance: Normal appearance.  Cardiovascular:     Rate and Rhythm: Normal rate and regular rhythm.  Pulmonary:     Effort:  Pulmonary effort is normal.     Breath sounds: Normal breath sounds.  Musculoskeletal:     Comments: Left ankle no erythema.  No ecchymosis.  No warmth.  He has some poorly localized nonspecific tenderness laterally below the lateral malleolus.  He has pain with inversion and eversion of the ankle-laterally.  No Achilles tenderness.  No metatarsal tenderness.  Distal pulses.  Neurological:     Mental Status: He is alert.        Assessment:     Several week/month history of left lateral ankle pain.  He has history of questionable gout but clinically this does not appear to be acute gout flare today.  No recent injury    Plan:     -x-rays of the left ankle -Try icing 20 to 30 minutes 2-3 times daily -Trial of diclofenac 1% gel apply 3-4 times daily -Consider sports medicine or orthopedic referral if not improving over the next couple weeks  Eulas Post MD [facility]

## 2019-04-27 NOTE — Patient Instructions (Signed)
Try icing the ankle 20 minutes 2-3 times daily.    Try the diclofenac gel three times daily  Let me know if not improving with the above.

## 2019-04-28 ENCOUNTER — Telehealth: Payer: Self-pay

## 2019-05-03 ENCOUNTER — Telehealth: Payer: Self-pay

## 2019-05-03 ENCOUNTER — Telehealth: Payer: BLUE CROSS/BLUE SHIELD | Admitting: Internal Medicine

## 2019-05-03 NOTE — Telephone Encounter (Signed)
PA for diclofenac sodium (VOLTAREN) 1 % GEL has been sent to cover my meds.  Key: [id number] - Rx #: [id number]

## 2019-05-04 ENCOUNTER — Other Ambulatory Visit: Payer: Self-pay

## 2019-05-04 ENCOUNTER — Encounter: Payer: Self-pay | Admitting: Family Medicine

## 2019-05-04 MED ORDER — INDOMETHACIN 50 MG PO CAPS: 50.0000 mg | ORAL_CAPSULE | Freq: Three times a day (TID) | ORAL | 1 refills | Status: DC | PRN

## 2019-05-04 NOTE — Telephone Encounter (Signed)
Left message regarding appt on 05/05/19.

## 2019-05-05 ENCOUNTER — Encounter: Payer: Self-pay | Admitting: Internal Medicine

## 2019-05-05 ENCOUNTER — Telehealth (INDEPENDENT_AMBULATORY_CARE_PROVIDER_SITE_OTHER): Payer: BC Managed Care – PPO | Admitting: Internal Medicine

## 2019-05-05 ENCOUNTER — Other Ambulatory Visit: Payer: Self-pay

## 2019-05-05 VITALS — Ht 72.0 in | Wt 293.0 lb

## 2019-05-05 DIAGNOSIS — I10 Essential (primary) hypertension: Secondary | ICD-10-CM | POA: Diagnosis not present

## 2019-05-05 DIAGNOSIS — I4819 Other persistent atrial fibrillation: Secondary | ICD-10-CM

## 2019-05-05 MED ORDER — CARVEDILOL 25 MG PO TABS: ORAL_TABLET | ORAL | 3 refills | Status: DC

## 2019-05-05 NOTE — Progress Notes (Signed)
Electrophysiology TeleHealth Note   Due to national recommendations of social distancing due to COVID 19, an audio/video telehealth visit is felt to be most appropriate for this patient at this time.  See MyChart message from today for the patient's consent to telehealth for [facility].   Date:  05/05/2019   ID:  Gregory Barrett, DOB Dec 08, 1970, MRN [id number]  Location: patient's home  Provider location:  [facility]  Evaluation Performed: Follow-up visit  PCP:  Eulas Post, MD   Electrophysiologist:  Dr Rayann Heman  Chief Complaint:  palpitations  History of Present Illness:    Gregory Barrett is a 48 y.o. male who presents via telehealth conferencing today.  Since last being seen in our clinic, the patient reports doing very well.  He has rare afib episodes.  This was triggered by stress.  He also has PVCs. Today, he denies symptoms of chest pain, shortness of breath,  lower extremity edema, dizziness, presyncope, or syncope.  The patient is otherwise without complaint today.  The patient denies symptoms of fevers, chills, cough, or new SOB worrisome for COVID 19.  Past Medical History:  Diagnosis Date  . Anxiety    when he was in A-fib, attributed to adrenal gland and fatigue  . Arthritis    legs, ankles  . GERD (gastroesophageal reflux disease)   . Hyperaldosteronism ([place])   . Hypertension   . Melanoma of lower back ([place]) 2000s  . Migraine    none for years  . Persistent atrial fibrillation   . Pneumonia    "a few times; last time ~ 2010" (04/03/2017)    Past Surgical History:  Procedure Laterality Date  . ATRIAL FIBRILLATION ABLATION N/A 04/03/2017   Procedure: Atrial Fibrillation Ablation;  Surgeon: Thompson Grayer, MD;  Location: [place] [facility];  Service: Cardiovascular;  Laterality: N/A;  . CARDIOVERSION N/A 09/13/2016   Procedure: CARDIOVERSION;  Surgeon: Fay Records, MD;  Location: [place];  Service: Cardiovascular;  Laterality: N/A;  .  CARDIOVERSION N/A 10/02/2016   Procedure: CARDIOVERSION;  Surgeon: Larey Dresser, MD;  Location: [place];  Service: Cardiovascular;  Laterality: N/A;  . CARDIOVERSION N/A 04/10/2017   Procedure: CARDIOVERSION;  Surgeon: Fay Records, MD;  Location: [place];  Service: Cardiovascular;  Laterality: N/A;  . CARDIOVERSION N/A 04/28/2017   Procedure: CARDIOVERSION;  Surgeon: Pixie Casino, MD;  Location: [place];  Service: Cardiovascular;  Laterality: N/A;  . HARDWARE REMOVAL Right 12/12/2015   Procedure: HARDWARE REMOVAL RIGHT TIBIAL;  Surgeon: Altamese Metompkin, MD;  Location: [place];  Service: Orthopedics;  Laterality: Right;  . IR US GUIDE VASC ACCESS RIGHT  07/16/2017  . IR US GUIDE VASC ACCESS RIGHT  07/16/2017  . IR VENOGRAM ADRENAL BI  07/16/2017  . IR VENOGRAM RENAL UNI LEFT  07/16/2017  . IR VENOUS SAMPLING  07/16/2017  . IR VENOUS SAMPLING  07/16/2017  . JOINT REPLACEMENT    . LAPAROSCOPIC ADRENALECTOMY Left 09/16/2017  . LAPAROSCOPIC ADRENALECTOMY Left 09/16/2017   Procedure: LAPAROSCOPIC LEFT ADRENALECTOMY;  Surgeon: Armandina Gemma, MD;  Location: [place];  Service: General;  Laterality: Left;  Marland Kitchen MELANOMA EXCISION     "back"  . ORIF NONUNION / MALUNION TIBIAL FRACTURE Right 2005   "put plates & screws in"  . TOTAL KNEE ARTHROPLASTY Right 03/19/2016   Procedure: RIGHT TOTAL KNEE ARTHROPLASTY;  Surgeon: Paralee Cancel, MD;  [facility];  Service: Orthopedics;  Laterality: Right;  Marland Kitchen VASECTOMY  Current Outpatient Medications  Medication Sig Dispense Refill  . carvedilol (COREG) 25 MG tablet TAKE 1 TABLET BY MOUTH 2 (TWO) TIMES DAILY. 180 tablet 2  . clonazePAM (KLONOPIN) 0.5 MG tablet Take 1 tablet (0.5 mg total) by mouth 2 (two) times daily as needed for anxiety. 60 tablet 2  . diclofenac sodium (VOLTAREN) 1 % GEL Apply 4 g topically 4 (four) times daily. 100 g 2  . esomeprazole (NEXIUM) 20 MG capsule Take 20 mg by mouth daily.    . indomethacin (INDOCIN) 50 MG capsule  Take 1 capsule (50 mg total) by mouth every 8 (eight) hours as needed for mild pain. For Gout Flare 60 capsule 1   No current facility-administered medications for this visit.     Allergies:   Patient has no known allergies.   Social History:  The patient  reports that he quit smoking about 8 years ago. His smoking use included cigarettes. He has a 1.00 pack-year smoking history. He quit smokeless tobacco use about 4 years ago.  His smokeless tobacco use included chew. He reports current alcohol use of about 3.0 standard drinks of alcohol per week. He reports that he does not use drugs.   Family History:  The patient's family history includes Bladder Cancer in his father; Cancer in his maternal grandfather; Hypertension in his maternal grandmother; Meniere's disease in his mother.   ROS:  Please see the history of present illness.   All other systems are personally reviewed and negative.    Exam:    Vital Signs:  Ht 6' (1.829 m)   Wt 293 lb (132.9 kg)   BMI 39.74 kg/m   Well appearing today, NAD, normal WOB   Labs/Other Tests and Data Reviewed:    Recent Labs: No results found for requested labs within last 8760 hours.   Wt Readings from Last 3 Encounters:  05/05/19 293 lb (132.9 kg)  04/27/19 292 lb 12.8 oz (132.8 kg)  04/08/18 (!) 307 lb 6.4 oz (139.4 kg)      ASSESSMENT & PLAN:    1.  Persistent atrial fibrillation Doing well post ablation off AAD therapy chads2vasc score is 1.  He is therefore not on anticoagulation therapy  2. HTN Stable No change required today   Follow-up:  12 months with me in the office   Patient Risk:  after full review of this patients clinical status, I feel that they are at moderate risk at this time.  Today, I have spent 15 minutes with the patient with telehealth technology discussing arrhythmia management .    Army Fossa, MD  05/05/2019 10:36 AM     [facility] [address] [place]  [address] [phone] (office) [phone] (fax)

## 2019-08-29 ENCOUNTER — Encounter: Payer: Self-pay | Admitting: Family Medicine

## 2019-08-30 ENCOUNTER — Other Ambulatory Visit: Payer: Self-pay

## 2019-08-30 MED ORDER — PREDNISONE 20 MG PO TABS: 20.0000 mg | ORAL_TABLET | Freq: Two times a day (BID) | ORAL | 0 refills | Status: DC

## 2019-08-30 NOTE — Telephone Encounter (Signed)
For the acute attack, call in Prednisone 20 mg to take BID for 4 days. As for maintenance, I see in 2018 he had an elevated uric acid level, so he could benefit from taking Allopurinol daily. He should speak to Dr. Elease Hashimoto about the possibility of getting on this after the current attack settles down

## 2019-09-01 ENCOUNTER — Other Ambulatory Visit: Payer: Self-pay

## 2019-09-01 MED ORDER — INDOMETHACIN 50 MG PO CAPS: 50.0000 mg | ORAL_CAPSULE | Freq: Three times a day (TID) | ORAL | 1 refills | Status: DC | PRN

## 2019-12-09 ENCOUNTER — Encounter: Payer: Self-pay | Admitting: Family Medicine

## 2019-12-17 ENCOUNTER — Other Ambulatory Visit: Payer: Self-pay

## 2019-12-20 ENCOUNTER — Ambulatory Visit (INDEPENDENT_AMBULATORY_CARE_PROVIDER_SITE_OTHER): Payer: Self-pay | Admitting: Family Medicine

## 2019-12-20 ENCOUNTER — Encounter: Payer: Self-pay | Admitting: Family Medicine

## 2019-12-20 ENCOUNTER — Other Ambulatory Visit: Payer: Self-pay

## 2019-12-20 VITALS — BP 120/80 | HR 92 | Temp 97.3°F | Ht 72.0 in | Wt 294.0 lb

## 2019-12-20 DIAGNOSIS — M2550 Pain in unspecified joint: Secondary | ICD-10-CM

## 2019-12-20 DIAGNOSIS — M25571 Pain in right ankle and joints of right foot: Secondary | ICD-10-CM

## 2019-12-20 DIAGNOSIS — M25572 Pain in left ankle and joints of left foot: Secondary | ICD-10-CM

## 2019-12-20 DIAGNOSIS — S46312A Strain of muscle, fascia and tendon of triceps, left arm, initial encounter: Secondary | ICD-10-CM

## 2019-12-20 MED ORDER — MELOXICAM 15 MG PO TABS: 15.0000 mg | ORAL_TABLET | Freq: Every day | ORAL | 0 refills | Status: DC

## 2019-12-20 NOTE — Progress Notes (Signed)
Subjective:     Patient ID: Gregory Barrett, male   DOB: 1971/01/18, 49 y.o.   MRN: [id number]  HPI Nethanel is seen with polyarthralgias.  His main complaint is bilateral ankle pain for the past several weeks.  Especially worse after prolonged periods of sitting.  He has increased stiffness that can last much of the day.  He works on concrete floors and wears boots most of the day.  He had recent change of boots but this did not seem to help any.  Has not noted any visible swelling.  He has somewhat equivocal history of gout in the past.  Apparently had acute elbow pain but no crystal analysis was done   He also complains of some left triceps pain.  No known injury.  He has been taking indomethacin from prior prescription which has not helped.  He denies any involvement of other joints other than bilateral ankles and left elbow.  He has not seen any objective evidence for inflammation such as erythema or visible swelling or heat.  Past Medical History:  Diagnosis Date  . Anxiety    when he was in A-fib, attributed to adrenal gland and fatigue  . Arthritis    legs, ankles  . GERD (gastroesophageal reflux disease)   . Hyperaldosteronism ([place])   . Hypertension   . Melanoma of lower back ([place]) 2000s  . Migraine    none for years  . Persistent atrial fibrillation ([place])   . Pneumonia    "a few times; last time ~ 2010" (04/03/2017)   Past Surgical History:  Procedure Laterality Date  . ATRIAL FIBRILLATION ABLATION N/A 04/03/2017   Procedure: Atrial Fibrillation Ablation;  Surgeon: Thompson Grayer, MD;  Location: [place] [facility];  Service: Cardiovascular;  Laterality: N/A;  . CARDIOVERSION N/A 09/13/2016   Procedure: CARDIOVERSION;  Surgeon: Fay Records, MD;  Location: [place];  Service: Cardiovascular;  Laterality: N/A;  . CARDIOVERSION N/A 10/02/2016   Procedure: CARDIOVERSION;  Surgeon: Larey Dresser, MD;  Location: [place];  Service: Cardiovascular;  Laterality: N/A;  .  CARDIOVERSION N/A 04/10/2017   Procedure: CARDIOVERSION;  Surgeon: Fay Records, MD;  Location: [place];  Service: Cardiovascular;  Laterality: N/A;  . CARDIOVERSION N/A 04/28/2017   Procedure: CARDIOVERSION;  Surgeon: Pixie Casino, MD;  Location: [place];  Service: Cardiovascular;  Laterality: N/A;  . HARDWARE REMOVAL Right 12/12/2015   Procedure: HARDWARE REMOVAL RIGHT TIBIAL;  Surgeon: Altamese [place], MD;  Location: [place];  Service: Orthopedics;  Laterality: Right;  . IR US GUIDE VASC ACCESS RIGHT  07/16/2017  . IR US GUIDE VASC ACCESS RIGHT  07/16/2017  . IR VENOGRAM ADRENAL BI  07/16/2017  . IR VENOGRAM RENAL UNI LEFT  07/16/2017  . IR VENOUS SAMPLING  07/16/2017  . IR VENOUS SAMPLING  07/16/2017  . JOINT REPLACEMENT    . LAPAROSCOPIC ADRENALECTOMY Left 09/16/2017  . LAPAROSCOPIC ADRENALECTOMY Left 09/16/2017   Procedure: LAPAROSCOPIC LEFT ADRENALECTOMY;  Surgeon: Armandina Gemma, MD;  Location: [place];  Service: General;  Laterality: Left;  Marland Kitchen MELANOMA EXCISION     "back"  . ORIF NONUNION / MALUNION TIBIAL FRACTURE Right 2005   "put plates & screws in"  . TOTAL KNEE ARTHROPLASTY Right 03/19/2016   Procedure: RIGHT TOTAL KNEE ARTHROPLASTY;  Surgeon: Paralee Cancel, MD;  [facility];  Service: Orthopedics;  Laterality: Right;  Marland Kitchen VASECTOMY      reports that he quit smoking about 9 years ago. His smoking use included cigarettes.  He has a 1.00 pack-year smoking history. He quit smokeless tobacco use about 5 years ago.  His smokeless tobacco use included chew. He reports current alcohol use of about 3.0 standard drinks of alcohol per week. He reports that he does not use drugs. family history includes Bladder Cancer in his father; Cancer in his maternal grandfather; Hypertension in his maternal grandmother; Meniere's disease in his mother. No Known Allergies   Review of Systems  Constitutional: Negative for chills and fever.  Respiratory: Negative for shortness of breath.    Cardiovascular: Negative for chest pain.  Musculoskeletal: Positive for arthralgias.  Skin: Negative for rash.  Hematological: Negative for adenopathy.       Objective:   Physical Exam Vitals reviewed.  Constitutional:      Appearance: Normal appearance.  Cardiovascular:     Rate and Rhythm: Normal rate and regular rhythm.  Musculoskeletal:     Comments: Ankles reveal no edema.  No warmth.  No erythema.  He has some nonspecific tenderness medially and laterally.  Fairly good range of motion with eversion, inversion, plantarflexion And dorsiflexion  Left elbow reveals full range of motion.  He has some pain with triceps contraction with elbow extension against resistance.  Neurological:     Mental Status: He is alert.        Assessment:     #1 bilateral ankle pain.  Question osteoarthritis.  Symptoms have been progressive over several weeks.  Equivocal history of gout as above and doubt this is related to that  #2 left arm pain.  Suspect triceps origin.  No weakness.    Plan:     -Check labs including C-reactive protein, antinuclear antibody, CCP antibody, rheumatoid factor, uric acid level  -Trial of meloxicam 15 mg once daily with food  -If above labs normal with no evidence for inflammatory arthritis consider referral to sports medicine for further evaluation  Eulas Post MD [facility]

## 2019-12-21 LAB — C-REACTIVE PROTEIN: CRP: <1 mg/dL (ref 0.5–20.0)

## 2019-12-21 LAB — URIC ACID: Uric Acid, Serum: 9 mg/dL — ABNORMAL HIGH (ref 4.0–7.8)

## 2019-12-21 NOTE — Addendum Note (Signed)
Addended by: Gwenyth Ober R on: 12/21/2019 03:25 PM   Modules accepted: Orders

## 2019-12-22 LAB — CYCLIC CITRUL PEPTIDE ANTIBODY, IGG: Cyclic Citrullin Peptide Ab: <16 UNITS

## 2019-12-22 LAB — ANA: Anti Nuclear Antibody (ANA): POSITIVE — AB

## 2019-12-22 LAB — ANTI-NUCLEAR AB-TITER (ANA TITER): ANA Titer 1: 1:40 {titer} — ABNORMAL HIGH

## 2019-12-22 LAB — RHEUMATOID FACTOR: Rhuematoid fact SerPl-aCnc: <14 IU/mL (ref <14)

## 2019-12-23 ENCOUNTER — Other Ambulatory Visit: Payer: Self-pay

## 2019-12-23 ENCOUNTER — Ambulatory Visit (INDEPENDENT_AMBULATORY_CARE_PROVIDER_SITE_OTHER): Payer: BC Managed Care – PPO | Admitting: Family Medicine

## 2019-12-23 ENCOUNTER — Ambulatory Visit: Payer: Self-pay

## 2019-12-23 ENCOUNTER — Encounter: Payer: Self-pay | Admitting: Family Medicine

## 2019-12-23 VITALS — BP 130/88 | HR 88 | Ht 72.0 in | Wt 290.0 lb

## 2019-12-23 DIAGNOSIS — M25572 Pain in left ankle and joints of left foot: Secondary | ICD-10-CM

## 2019-12-23 DIAGNOSIS — M25571 Pain in right ankle and joints of right foot: Secondary | ICD-10-CM

## 2019-12-23 MED ORDER — COLCHICINE 0.6 MG PO TABS: 0.6000 mg | ORAL_TABLET | Freq: Two times a day (BID) | ORAL | 1 refills | Status: DC

## 2019-12-23 MED ORDER — ALLOPURINOL 300 MG PO TABS: 300.0000 mg | ORAL_TABLET | Freq: Every day | ORAL | 1 refills | Status: DC

## 2019-12-23 NOTE — Progress Notes (Addendum)
Subjective:    I'm seeing this patient as a consultation for:  Dr. Elease Hashimoto. Note will be routed back to referring provider/PCP.  CC: B ankle pain  I, Molly Weber, LAT, ATC, am serving as scribe for Dr. Lynne Leader.  HPI: Pt is a 49 y/o male presenting w/ c/o B ankle pain x several weeks w/ no known MOI.  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  He has tried changing his work boots w/ no change in his symptoms.  He rates his pain at a 3/10 all day and when episodes occur 7/10 and sometimes has to use crutches to get around and describes his pain as. Uses voltaren gel at night to help sleep and started meloxicam today and seems to help. ROM is a little better but still painful. Does have history of gout.  Works as an Conservation officer, nature for [employer] pain: No Ankle/foot swelling: No Numbness/tingling: Aggravating factors: Standing after sitting for a prolonged period of time Treatments tried: Meloxicam  Past medical history, Surgical history, Family history, Social history, Allergies, and medications have been entered into the medical record, reviewed.   Review of Systems: No new headache, visual changes, nausea, vomiting, diarrhea, constipation, dizziness, abdominal pain, skin rash, fevers, chills, night sweats, weight loss, swollen lymph nodes, body aches, joint swelling, muscle aches, chest pain, shortness of breath, mood changes, visual or auditory hallucinations.   Objective:    Vitals:   12/23/19 1522  BP: 130/88  Pulse: 88  SpO2: 99%   General: Well Developed, well nourished, and in no acute distress.  Neuro/Psych: Alert and oriented x3, extra-ocular muscles intact, able to move all 4 extremities, sensation grossly intact. Skin: Warm and dry, no rashes noted.  Respiratory: Not using accessory muscles, speaking in full sentences, trachea midline.  Cardiovascular: Pulses palpable, no extremity edema. Abdomen: Does not appear distended. MSK:  Left  foot and ankle: Significant ankle pronation with mild medial ankle subluxation. No swelling or other significant deformity. No significant heel valgus with toe standing. Normal ankle motion. Mildly tender palpation anterior lateral ankle. Mildly tender also to palpation along posterior medial ankle along course of posterior tibialis tendon. Intact strength. Stable ligamentous exam.  Pulses cap refill and sensation are intact distally.   Right foot and ankle: Moderate ankle pronation.  No significant ankle subluxation. No swelling or significant deformity otherwise. Positive appropriate heel valgus with toe standing. Normal ankle motion. Mildly tender palpation anterior lateral ankle. Intact strength. Stable ligamentous exam.  Pulses cap refill and sensation are intact distally.  Lab and Radiology Results  Diagnostic Limited MSK Ultrasound of: Left ankle Intact Achilles tendon.  No severe osteophyte. Intact posterior tibialis and peroneal tendons.  Slight hypoechoic fluid at posterior tibialis tendon at medial malleolus. No significant ankle effusion.  Normal bony structures otherwise. Impression: Posterior tibialis tendinitis  Diagnostic Limited MSK Ultrasound of: Right ankle Intact Achilles tendon no severe osteophyte. Intact posterior tibialis tendon and peroneal tendons. No significant hypoechoic fluid. No significant ankle effusion.  Normal bony structures otherwise. Impression: Largely normal ankle MSK ultrasound.  Recent Results (from the past 2160 hour(s))  C-reactive Protein     Status: None   Collection Time: 12/20/19 11:01 AM  Result Value Ref Range   CRP <1.0 0.5 - 20.0 mg/dL  Uric Acid     Status: Abnormal   Collection Time: 12/20/19 11:01 AM  Result Value Ref Range   Uric Acid, Serum 9.0 (H) 4.0 - 7.8 mg/dL  ANA     Status: Abnormal   Collection Time: 12/20/19 11:01 AM  Result Value Ref Range   Anti Nuclear Antibody (ANA) POSITIVE (A) NEGATIVE    Comment:  ANA IFA is a first line screen for detecting the presence of up to approximately 150 autoantibodies in various autoimmune diseases. A positive ANA IFA result is suggestive of autoimmune disease and reflexes to titer and pattern. Further laboratory testing may be considered if clinically indicated. . For additional information, please refer to http://education.[facility].com/faq/FAQ177 (This link is being provided for informational/ educational purposes only.) .   Rheumatoid Factor     Status: None   Collection Time: 12/20/19 11:01 AM  Result Value Ref Range   Rhuematoid fact SerPl-aCnc 99991111 [id number] IU/mL  Cyclic citrul peptide antibody, IgG     Status: None   Collection Time: 12/20/19 11:01 AM  Result Value Ref Range   Cyclic Citrullin Peptide Ab <16 UNITS    Comment: Reference Range Negative:            <20 Weak Positive:       20-39 Moderate Positive:   40-59 Strong Positive:     >59 .   Anti-nuclear ab-titer (ANA titer)     Status: Abnormal   Collection Time: 12/20/19 11:01 AM  Result Value Ref Range   ANA Titer 1 1:40 (H) titer    Comment: A low level ANA titer may be present in pre-clinical autoimmune diseases and normal individuals.                 Reference Range                 <1:40        Negative                 1:40-1:80    Low Antibody Level                 >1:80        Elevated Antibody Level .    ANA Pattern 1 Nuclear, Speckled (A)     Comment: Speckled pattern is associated with mixed connective tissue disease (MCTD), systemic lupus erythematosus (SLE), Sjogren's syndrome, dermatomyositis, and  systemic sclerosis/polymyositis overlap. . AC-2,4,5,29: Speckled . International Consensus on ANA Patterns (https://www.hernandez-brewer.com/)      Impression and Recommendations:    Assessment and Plan: 49 y.o. male with bilateral ankle pain.  Left ankle worse than right. Pain is multifactorial.  Some the pain is due to posterior tibialis  tendinitis.  Some the pain is also due to lateral ankle impingement due to pronation.  Additionally gout is probably playing a factor as well.  Discussed treatment plan and options.  Plan to modify footwear and add scaphoid pads.  Perform posterior tibialis tendinitis rehab exercises and use compressive ankle sleeve.    Additionally will treat gout.  Uric acid recently checked is 9.  We will start allopurinol.  Will use colchicine as prophylaxis for the first 3 months to avoid gout flare.  Check back in 6 weeks .  If not better would consider injection versus MRI.  PDMP not reviewed this encounter. Orders Placed This Encounter  Procedures  . [place] LIMITED JOINT SPACE STRUCTURES LOW BILAT(NO LINKED CHARGES)    Order Specific Question:   Reason for Exam (SYMPTOM  OR DIAGNOSIS REQUIRED)    Answer:   B ankle pain    Order Specific Question:   Preferred imaging location?    Answer:   [facility]  [place]   Meds ordered this encounter  Medications  . allopurinol (ZYLOPRIM) 300 MG tablet    Sig: Take 1 tablet (300 mg total) by mouth daily.    Dispense:  90 tablet    Refill:  1  . colchicine 0.6 MG tablet    Sig: Take 1 tablet (0.6 mg total) by mouth 2 (two) times daily. To prevent gout    Dispense:  90 tablet    Refill:  1    Discussed warning signs or symptoms. Please see discharge instructions. Patient expresses understanding.   The above documentation has been reviewed and is accurate and complete Lynne Leader

## 2019-12-23 NOTE — Patient Instructions (Addendum)
Thank you for coming in today. Do the exercise [facility] reviewed.  Use body helix full ankle sleeve.  Use hapad scaphoid pad size medium  Start allopurinol to lower uric acid.  Start colchicine daily to prevent gout attacks for 3 months.  Recheck with me in 6 weeks or sooner if needed.   Please perform the exercise program that we have prepared for you and gone over in detail on a daily basis.  In addition to the handout you were provided you can access your program through: [email or web address]   Your unique program code is:  [id number]

## 2019-12-24 ENCOUNTER — Encounter: Payer: Self-pay | Admitting: Family Medicine

## 2019-12-24 MED ORDER — COLCHICINE 0.6 MG PO CAPS: 0.6000 mg | ORAL_CAPSULE | Freq: Every day | ORAL | 3 refills | Status: DC

## 2020-01-07 ENCOUNTER — Encounter: Payer: Self-pay | Admitting: Family Medicine

## 2020-01-14 ENCOUNTER — Other Ambulatory Visit: Payer: Self-pay | Admitting: Family Medicine

## 2020-02-07 ENCOUNTER — Ambulatory Visit: Payer: BC Managed Care – PPO | Admitting: Family Medicine

## 2020-02-07 DIAGNOSIS — Z0289 Encounter for other administrative examinations: Secondary | ICD-10-CM

## 2020-02-07 NOTE — Progress Notes (Deleted)
   I, Wendy Poet, LAT, ATC, am serving as scribe for Dr. Lynne Leader.  Gregory Barrett is a 49 y.o. male who presents to [place] at [facility] today for B ankle pain.  He was last seen by Dr. Georgina Snell on 12/23/19 and was provided a HEP, given scaphoid pads for his shoes and advised to get B ankle Body Helix sleeves.  He was also prescribed cholchicine and allopurinol.  Since his last visit, pt reports   Diagnostic imaging: L ankle XR- 04/27/19  Pertinent review of systems: ***  Relevant historical information: ***   Exam:  There were no vitals taken for this visit. General: Well Developed, well nourished, and in no acute distress.   MSK: ***    Lab and Radiology Results No results found for this or any previous visit (from the past 72 hour(s)). No results found.     Assessment and Plan: 49 y.o. male with ***   PDMP not reviewed this encounter. No orders of the defined types were placed in this encounter.  No orders of the defined types were placed in this encounter.    Discussed warning signs or symptoms. Please see discharge instructions. Patient expresses understanding.   ***

## 2020-02-17 ENCOUNTER — Other Ambulatory Visit: Payer: Self-pay | Admitting: Family Medicine

## 2020-04-27 ENCOUNTER — Encounter: Payer: Self-pay | Admitting: Internal Medicine

## 2020-05-08 ENCOUNTER — Ambulatory Visit: Payer: BC Managed Care – PPO | Admitting: Internal Medicine

## 2020-05-15 ENCOUNTER — Encounter: Payer: Self-pay | Admitting: Family Medicine

## 2020-05-15 ENCOUNTER — Other Ambulatory Visit: Payer: Self-pay

## 2020-05-15 ENCOUNTER — Ambulatory Visit (INDEPENDENT_AMBULATORY_CARE_PROVIDER_SITE_OTHER): Payer: BC Managed Care – PPO | Admitting: Family Medicine

## 2020-05-15 ENCOUNTER — Ambulatory Visit (INDEPENDENT_AMBULATORY_CARE_PROVIDER_SITE_OTHER): Payer: BC Managed Care – PPO

## 2020-05-15 ENCOUNTER — Ambulatory Visit: Payer: Self-pay

## 2020-05-15 VITALS — BP 116/80 | HR 98 | Ht 72.0 in | Wt 290.0 lb

## 2020-05-15 DIAGNOSIS — G8929 Other chronic pain: Secondary | ICD-10-CM | POA: Diagnosis not present

## 2020-05-15 DIAGNOSIS — M25572 Pain in left ankle and joints of left foot: Secondary | ICD-10-CM

## 2020-05-15 DIAGNOSIS — M19072 Primary osteoarthritis, left ankle and foot: Secondary | ICD-10-CM | POA: Diagnosis not present

## 2020-05-15 DIAGNOSIS — M109 Gout, unspecified: Secondary | ICD-10-CM

## 2020-05-15 MED ORDER — PREDNISONE 50 MG PO TABS
50.0000 mg | ORAL_TABLET | Freq: Every day | ORAL | 0 refills | Status: DC
Start: 2020-05-15 — End: 2020-06-23

## 2020-05-15 MED ORDER — HYDROCODONE-ACETAMINOPHEN 5-325 MG PO TABS: 1.0000 | ORAL_TABLET | Freq: Four times a day (QID) | ORAL | 0 refills | Status: DC | PRN

## 2020-05-15 NOTE — Progress Notes (Signed)
I, Gregory Barrett, LAT, ATC, am serving as scribe for Dr. Lynne Barrett.  Gregory Barrett is a 49 y.o. male who presents to [place] at [facility] today for f/u of L ankle pain and new L knee pain.  He was last seen by Dr. Georgina Snell on 12/23/19 for B ankle pain.  He was provided w/ Hapad pads for his shoe inserts and was shown a HEP for post tib dysfunction.  He was also prescribed allopurinol and colchicine due to a hx of gout.  He has been taking his allopurinol consistently and taking colchicine only as needed over the last few months.  Since his last visit, pt reports worsening L ankle pain and new L knee pain. Patient states the ankle is not doing well. ROM is still limited and he was not able to complete exercises.   He states his ankle pain does not feel like gout.  Diagnostic testing: L ankle XR- 04/27/19  Pertinent review of systems: No fevers or chills  Relevant historical information: Gout   Exam:  BP 116/80 (BP Location: Left Arm, Patient Position: Sitting)   Pulse 98   Ht 6' (1.829 m)   Wt 290 lb (131.5 kg)   SpO2 97%   BMI 39.33 kg/m  General: Well Developed, well nourished, and in no acute distress.   MSK: Left ankle swollen medially and laterally.  Tender palpation lateral aspect of ankle. Decreased motion.  Stable ligamentous exam.  Intact strength.    Lab and Radiology Results  X-ray images left ankle obtained today personally and independently reviewed This 5 the present at anterior aspect of ankle joint.  Lucency present in distal fibula question bone cyst.  Mild to moderate degenerative changes.  No acute fracture. Await formal radiology review  Diagnostic Limited MSK Ultrasound of: Lateral left ankle Mild effusion present at lateral ankle joint. Peroneal tendons are intact with small amount of hypoechoic fluid tracking in proximal tendon sheath.  No severe tenosynovitis. Bony structures normal-appearing otherwise Impression: Mild ankle  effusion     Assessment and Plan: 49 y.o. male with exacerbation of chronic left ankle pain.  Unclear etiology.  Patient has what appears to be a loose body present at the anterior talotibial joint.  This certainly could be a cause of his pain.  Additionally on x-ray per my interpretation there appears to be a lucency at the distal fibula which may be a bone cyst.  Regardless he is clearly failing conservative management.  Plan for MRI to further characterize cause of pain.  In the meantime course of prednisone and hydrocodone and work note provided.  Gout: I do not think gout is playing a huge role here however we will recheck his uric acid as it was 9 when last checked in February.  Since then he started allopurinol expect it is probably going to be in the controlled range.   PDMP reviewed during this encounter. Orders Placed This Encounter  Procedures  . [place] LIMITED JOINT SPACE STRUCTURES LOW LEFT(NO LINKED CHARGES)    Order Specific Question:   Reason for Exam (SYMPTOM  OR DIAGNOSIS REQUIRED)    Answer:   left ankle pain    Order Specific Question:   Preferred imaging location?    Answer:   [facility]  . MR ANKLE LEFT WO CONTRAST    Standing Status:   Future    Standing Expiration Date:   05/15/2021    Order Specific Question:   What is the patient's sedation  requirement?    Answer:   No Sedation    Order Specific Question:   Does the patient have a pacemaker or implanted devices?    Answer:   No    Order Specific Question:   Preferred imaging location?    Answer:   Product/process development scientist (table limit-350lbs)    Order Specific Question:   Radiology Contrast Protocol - do NOT remove file path    Answer:   \\[email or web address]\[email or web address]\Radiant\[email or web address].PDF  . DG Ankle Complete Left    Standing Status:   Future    Number of Occurrences:   1    Standing Expiration Date:   05/15/2021    Order Specific Question:   Reason for Exam (SYMPTOM  OR DIAGNOSIS REQUIRED)    Answer:   evak left ankle  pain    Order Specific Question:   Preferred imaging location?    Answer:   [facility]    Order Specific Question:   Radiology Contrast Protocol - do NOT remove file path    Answer:   \\[email or web address]\Radiant\DXFluoroContrastProtocols.pdf  . Uric acid    Standing Status:   Future    Number of Occurrences:   1    Standing Expiration Date:   05/15/2021   Meds ordered this encounter  Medications  . HYDROcodone-acetaminophen (NORCO/VICODIN) 5-325 MG tablet    Sig: Take 1 tablet by mouth every 6 (six) hours as needed.    Dispense:  10 tablet    Refill:  0  . predniSONE (DELTASONE) 50 MG tablet    Sig: Take 1 tablet (50 mg total) by mouth daily.    Dispense:  5 tablet    Refill:  0     Discussed warning signs or symptoms. Please see discharge instructions. Patient expresses understanding.   The above documentation has been reviewed and is accurate and complete Gregory Barrett, M.D.

## 2020-05-15 NOTE — Patient Instructions (Addendum)
Thank you for coming in today. Get labs and xray today.  Plan for MRI soon.  You will hear about MRI scheduling in next few days.  Work as able.  Start prednisone now.  Use hydrocodone for severe pain.   Recheck after MRI.

## 2020-05-16 LAB — URIC ACID: Uric Acid, Serum: 6.2 mg/dL (ref 4.0–8.0)

## 2020-05-16 NOTE — Progress Notes (Signed)
Uric acid significantly better at 6.2.

## 2020-05-16 NOTE — Progress Notes (Signed)
X-ray left knee shows calcific density over the anterior aspect of the ankle joint possible small loose body.  Some arthritis present as well.  MRI will be helpful to further evaluate ankle pain.

## 2020-05-17 ENCOUNTER — Other Ambulatory Visit: Payer: Self-pay | Admitting: Family Medicine

## 2020-05-17 DIAGNOSIS — Z0189 Encounter for other specified special examinations: Secondary | ICD-10-CM

## 2020-05-17 DIAGNOSIS — Z1389 Encounter for screening for other disorder: Secondary | ICD-10-CM

## 2020-05-21 ENCOUNTER — Ambulatory Visit (INDEPENDENT_AMBULATORY_CARE_PROVIDER_SITE_OTHER): Payer: BC Managed Care – PPO

## 2020-05-21 ENCOUNTER — Other Ambulatory Visit: Payer: Self-pay

## 2020-05-21 DIAGNOSIS — Z1389 Encounter for screening for other disorder: Secondary | ICD-10-CM

## 2020-05-21 DIAGNOSIS — M7989 Other specified soft tissue disorders: Secondary | ICD-10-CM | POA: Diagnosis not present

## 2020-05-21 DIAGNOSIS — Z01818 Encounter for other preprocedural examination: Secondary | ICD-10-CM | POA: Diagnosis not present

## 2020-05-21 DIAGNOSIS — G8929 Other chronic pain: Secondary | ICD-10-CM

## 2020-05-21 DIAGNOSIS — S86312A Strain of muscle(s) and tendon(s) of peroneal muscle group at lower leg level, left leg, initial encounter: Secondary | ICD-10-CM | POA: Diagnosis not present

## 2020-05-21 DIAGNOSIS — M25572 Pain in left ankle and joints of left foot: Secondary | ICD-10-CM

## 2020-05-21 DIAGNOSIS — M19072 Primary osteoarthritis, left ankle and foot: Secondary | ICD-10-CM | POA: Diagnosis not present

## 2020-05-21 DIAGNOSIS — M25472 Effusion, left ankle: Secondary | ICD-10-CM | POA: Diagnosis not present

## 2020-05-21 DIAGNOSIS — Z0189 Encounter for other specified special examinations: Secondary | ICD-10-CM

## 2020-05-22 ENCOUNTER — Encounter: Payer: Self-pay | Admitting: Family Medicine

## 2020-05-23 ENCOUNTER — Other Ambulatory Visit: Payer: Self-pay | Admitting: Physical Therapy

## 2020-05-23 DIAGNOSIS — G8929 Other chronic pain: Secondary | ICD-10-CM

## 2020-05-23 DIAGNOSIS — M25572 Pain in left ankle and joints of left foot: Secondary | ICD-10-CM

## 2020-05-24 ENCOUNTER — Encounter: Payer: Self-pay | Admitting: Physical Therapy

## 2020-05-29 DIAGNOSIS — M19072 Primary osteoarthritis, left ankle and foot: Secondary | ICD-10-CM | POA: Diagnosis not present

## 2020-05-29 NOTE — Progress Notes (Signed)
Significant changes present in the ankle. You have moderate to severe arthritis in part of the ankle and changes concerning for developing fracture.  Additionally stress fracture likely present in the heel bone.Subchondral cyst measuring about a centimeter present in the ankle bone as well.  Small tear present in the peroneal tendon in the side of the ankle.  Recommend you schedule follow-up appoint with me in the near future to discuss these results in detail.  This likely will require surgical consultation.  Happy to go ahead and arrange for that now if he would like.

## 2020-06-06 ENCOUNTER — Other Ambulatory Visit: Payer: Self-pay | Admitting: Orthopaedic Surgery

## 2020-06-06 DIAGNOSIS — M25572 Pain in left ankle and joints of left foot: Secondary | ICD-10-CM

## 2020-06-14 ENCOUNTER — Ambulatory Visit
Admission: RE | Admit: 2020-06-14 | Discharge: 2020-06-14 | Disposition: A | Discharge status: Home / Self Care | Payer: BC Managed Care – PPO | Source: Ambulatory Visit | Attending: Orthopaedic Surgery | Admitting: Orthopaedic Surgery

## 2020-06-14 ENCOUNTER — Other Ambulatory Visit: Payer: Self-pay | Admitting: Family Medicine

## 2020-06-14 ENCOUNTER — Other Ambulatory Visit: Payer: Self-pay

## 2020-06-14 DIAGNOSIS — M19072 Primary osteoarthritis, left ankle and foot: Secondary | ICD-10-CM | POA: Diagnosis not present

## 2020-06-14 DIAGNOSIS — M25572 Pain in left ankle and joints of left foot: Secondary | ICD-10-CM

## 2020-06-23 ENCOUNTER — Encounter: Payer: Self-pay | Admitting: Internal Medicine

## 2020-06-23 ENCOUNTER — Other Ambulatory Visit: Payer: Self-pay

## 2020-06-23 ENCOUNTER — Ambulatory Visit (INDEPENDENT_AMBULATORY_CARE_PROVIDER_SITE_OTHER): Payer: BC Managed Care – PPO | Admitting: Internal Medicine

## 2020-06-23 VITALS — BP 116/80 | HR 94 | Ht 72.0 in | Wt 291.0 lb

## 2020-06-23 DIAGNOSIS — I4819 Other persistent atrial fibrillation: Secondary | ICD-10-CM | POA: Diagnosis not present

## 2020-06-23 DIAGNOSIS — I10 Essential (primary) hypertension: Secondary | ICD-10-CM | POA: Diagnosis not present

## 2020-06-23 DIAGNOSIS — M25572 Pain in left ankle and joints of left foot: Secondary | ICD-10-CM | POA: Diagnosis not present

## 2020-06-23 DIAGNOSIS — M19072 Primary osteoarthritis, left ankle and foot: Secondary | ICD-10-CM | POA: Diagnosis not present

## 2020-06-23 NOTE — Progress Notes (Signed)
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  Since last being seen in our clinic, the patient reports doing very well.  Today, he denies symptoms of palpitations, chest pain, shortness of breath,  lower extremity edema, dizziness, presyncope, or syncope.  The patient is otherwise without complaint today.   Past Medical History:  Diagnosis Date  . Anxiety    when he was in A-fib, attributed to adrenal gland and fatigue  . Arthritis    legs, ankles  . GERD (gastroesophageal reflux disease)   . Hyperaldosteronism ([place])   . Hypertension   . Melanoma of lower back ([place]) 2000s  . Migraine    none for years  . Persistent atrial fibrillation ([place])   . Pneumonia    "a few times; last time ~ 2010" (04/03/2017)   Past Surgical History:  Procedure Laterality Date  . ATRIAL FIBRILLATION ABLATION N/A 04/03/2017   Procedure: Atrial Fibrillation Ablation;  Surgeon: Thompson Grayer, MD;  Location: [place] [facility];  Service: Cardiovascular;  Laterality: N/A;  . CARDIOVERSION N/A 09/13/2016   Procedure: CARDIOVERSION;  Surgeon: Fay Records, MD;  Location: [place];  Service: Cardiovascular;  Laterality: N/A;  . CARDIOVERSION N/A 10/02/2016   Procedure: CARDIOVERSION;  Surgeon: Larey Dresser, MD;  Location: [place];  Service: Cardiovascular;  Laterality: N/A;  . CARDIOVERSION N/A 04/10/2017   Procedure: CARDIOVERSION;  Surgeon: Fay Records, MD;  Location: [place];  Service: Cardiovascular;  Laterality: N/A;  . CARDIOVERSION N/A 04/28/2017   Procedure: CARDIOVERSION;  Surgeon: Pixie Casino, MD;  Location: [place];  Service: Cardiovascular;  Laterality: N/A;  . HARDWARE REMOVAL Right 12/12/2015   Procedure: HARDWARE REMOVAL RIGHT TIBIAL;  Surgeon: Altamese [employer], MD;  Location: [place];  Service: Orthopedics;  Laterality: Right;  . IR US GUIDE VASC ACCESS RIGHT  07/16/2017  . IR US  GUIDE VASC ACCESS RIGHT  07/16/2017  . IR VENOGRAM ADRENAL BI  07/16/2017  . IR VENOGRAM RENAL UNI LEFT  07/16/2017  . IR VENOUS SAMPLING  07/16/2017  . IR VENOUS SAMPLING  07/16/2017  . JOINT REPLACEMENT    . LAPAROSCOPIC ADRENALECTOMY Left 09/16/2017  . LAPAROSCOPIC ADRENALECTOMY Left 09/16/2017   Procedure: LAPAROSCOPIC LEFT ADRENALECTOMY;  Surgeon: Armandina Gemma, MD;  Location: [place];  Service: General;  Laterality: Left;  Marland Kitchen MELANOMA EXCISION     "back"  . ORIF NONUNION / MALUNION TIBIAL FRACTURE Right 2005   "put plates & screws in"  . TOTAL KNEE ARTHROPLASTY Right 03/19/2016   Procedure: RIGHT TOTAL KNEE ARTHROPLASTY;  Surgeon: Paralee Cancel, MD;  Location: [facility];  Service: Orthopedics;  Laterality: Right;  Marland Kitchen VASECTOMY      ROS- all systems are reviewed and negatives except as per HPI above  Current Outpatient Medications  Medication Sig Dispense Refill  . allopurinol (ZYLOPRIM) 300 MG tablet Take 1 tablet (300 mg total) by mouth daily. 90 tablet 1  . carvedilol (COREG) 25 MG tablet TAKE 1 TABLET BY MOUTH 2 (TWO) TIMES DAILY. (Patient taking differently: Take 25 mg by mouth daily. TAKE 1 TABLET BY MOUTH 2 (TWO) TIMES DAILY.) 180 tablet 3  . Colchicine 0.6 MG CAPS Take 0.6 mg by mouth daily. 30 capsule 3  . esomeprazole (NEXIUM) 20 MG capsule Take 20 mg by mouth daily.     No current facility-administered medications for this visit.    Physical Exam: Vitals:  06/23/20 1611  BP: 116/80  Pulse: 94  SpO2: 96%  Weight: 291 lb (132 kg)  Height: 6' (1.829 m)    GEN- The patient is well appearing, alert and oriented x 3 today.   Head- normocephalic, atraumatic Eyes-  Sclera clear, conjunctiva pink Ears- hearing intact Oropharynx- clear Lungs- Clear to ausculation bilaterally, normal work of breathing Heart- Regular rate and rhythm, no murmurs, rubs or gallops, PMI not laterally displaced GI- soft, NT, ND, + BS Extremities- no clubbing, cyanosis, or edema  Wt Readings from  Last 3 Encounters:  06/23/20 291 lb (132 kg)  05/15/20 290 lb (131.5 kg)  12/23/19 290 lb (131.5 kg)    EKG tracing ordered today is personally reviewed and shows sinus  Assessment and Plan:  1. Persistent afib Well controled post ablation off AAD therapy chads2vasc score is 1.  He does not require [place]  2. HTN Stable No change required today  3 overweight Body mass index is 39.47 kg/m. Lifestyle modification is advised  Risks, benefits and potential toxicities for medications prescribed and/or refilled reviewed with patient today.   Return in a year  Thompson Grayer MD, [facility] 06/23/2020 4:37 PM

## 2020-06-23 NOTE — Patient Instructions (Signed)
Medication Instructions:  Your physician recommends that you continue on your current medications as directed. Please refer to the Current Medication list given to you today.  *If you need a refill on your cardiac medications before your next appointment, please call your pharmacy*  Lab Work: None ordered.  If you have labs (blood work) drawn today and your tests are completely normal, you will receive your results only by: . MyChart Message (if you have MyChart) OR . A paper copy in the mail If you have any lab test that is abnormal or we need to change your treatment, we will call you to review the results.  Testing/Procedures: None ordered.  Follow-Up: At [facility], you and your health needs are our priority.  As part of our continuing mission to provide you with exceptional heart care, we have created designated Provider Care Teams.  These Care Teams include your primary Cardiologist (physician) and Advanced Practice Providers (APPs -  Physician Assistants and Nurse Practitioners) who all work together to provide you with the care you need, when you need it.  We recommend signing up for the patient portal called "MyChart".  Sign up information is provided on this After Visit Summary.  MyChart is used to connect with patients for Virtual Visits (Telemedicine).  Patients are able to view lab/test results, encounter notes, upcoming appointments, etc.  Non-urgent messages can be sent to your provider as well.   To learn more about what you can do with MyChart, go to [email or web address].    Your next appointment:   Your physician wants you to follow-up in: 1 year with Dr. Allred. You will receive a reminder letter in the mail two months in advance. If you don't receive a letter, please call our office to schedule the follow-up appointment.   Other Instructions:  

## 2020-06-28 ENCOUNTER — Telehealth: Payer: Self-pay | Admitting: *Deleted

## 2020-06-28 NOTE — Telephone Encounter (Signed)
   Primary Cardiologist: Thompson Grayer, MD  Chart reviewed as part of pre-operative protocol coverage. Given past medical history and time since last visit, based on ACC/AHA guidelines, Gregory Barrett would be at acceptable risk for the planned procedure without further cardiovascular testing.   His RCRI is a class I risk, 0.4 risk of major cardiac event.  I will route this recommendation to the requesting party via Epic fax function and remove from pre-op pool.  Please call with questions.  Jossie Ng. Aiyanna Awtrey NP-C    06/28/2020, 10:53 AM [place] [address] Office [phone] Fax [phone]

## 2020-06-28 NOTE — Telephone Encounter (Signed)
   [facility] Pre-operative Risk Assessment    HEARTCARE STAFF: - Please ensure there is not already an duplicate clearance open for this procedure. - Under Visit Info/Reason for Call, type in Other and utilize the format Clearance MM/DD/YY or Clearance TBD. Do not use dashes or single digits. - If request is for dental extraction, please clarify the # of teeth to be extracted.  Request for surgical clearance: URGENT   1. What type of surgery is being performed? LEFT ANKLE ARTHROSCOPIC DEBRIDEMENT EXTENSIVE, ARTHROSCOPIC TREATMENT OF TALUS OSTEOCHONDRAL LESION, SUBTALAR ARTHRODESIS    2. When is this surgery scheduled? 06/29/20   3. What type of clearance is required (medical clearance vs. Pharmacy clearance to hold med vs. Both)? MEDICAL  4. Are there any medications that need to be held prior to surgery and how long? NONE LISTED   5. Practice name and name of physician performing surgery? [facility]; DR. Gerald Stabs ADAIR   6. What is the office phone number? [phone]   7.   What is the office fax number? [phone]  8.   Anesthesia type (None, local, MAC, general) ? GENERAL   Julaine Hua 06/28/2020, 10:41 AM  _________________________________________________________________   (provider comments below)

## 2020-06-29 DIAGNOSIS — G8918 Other acute postprocedural pain: Secondary | ICD-10-CM | POA: Diagnosis not present

## 2020-06-29 DIAGNOSIS — M19072 Primary osteoarthritis, left ankle and foot: Secondary | ICD-10-CM | POA: Diagnosis not present

## 2020-06-29 DIAGNOSIS — M25775 Osteophyte, left foot: Secondary | ICD-10-CM | POA: Diagnosis not present

## 2020-06-29 DIAGNOSIS — M24072 Loose body in left ankle: Secondary | ICD-10-CM | POA: Diagnosis not present

## 2020-06-29 DIAGNOSIS — M25872 Other specified joint disorders, left ankle and foot: Secondary | ICD-10-CM | POA: Diagnosis not present

## 2020-07-10 ENCOUNTER — Other Ambulatory Visit: Payer: Self-pay | Admitting: Family Medicine

## 2020-07-12 ENCOUNTER — Telehealth: Payer: Self-pay

## 2020-07-12 NOTE — Telephone Encounter (Signed)
Received PPW for pre-op assessment per Dr. Elease Hashimoto pt needs an in ov visit for surgical clearance. Called pt to schedule and pt stated he already had the surgery. No further action needed.

## 2020-07-14 DIAGNOSIS — M19072 Primary osteoarthritis, left ankle and foot: Secondary | ICD-10-CM | POA: Diagnosis not present

## 2020-07-27 ENCOUNTER — Encounter: Payer: Self-pay | Admitting: Family Medicine

## 2020-07-28 ENCOUNTER — Other Ambulatory Visit: Payer: Self-pay | Admitting: Internal Medicine

## 2020-07-28 MED ORDER — ALLOPURINOL 300 MG PO TABS: 300.0000 mg | ORAL_TABLET | Freq: Every day | ORAL | 3 refills | Status: DC

## 2020-08-11 DIAGNOSIS — M19072 Primary osteoarthritis, left ankle and foot: Secondary | ICD-10-CM | POA: Diagnosis not present

## 2020-08-24 ENCOUNTER — Other Ambulatory Visit: Payer: Self-pay | Admitting: Family Medicine

## 2020-09-08 DIAGNOSIS — M19072 Primary osteoarthritis, left ankle and foot: Secondary | ICD-10-CM | POA: Diagnosis not present

## 2021-02-23 ENCOUNTER — Other Ambulatory Visit: Payer: Self-pay | Admitting: Internal Medicine

## 2021-07-27 ENCOUNTER — Other Ambulatory Visit: Payer: Self-pay | Admitting: Family Medicine

## 2021-07-27 NOTE — Telephone Encounter (Signed)
Rx refill request approved per Dr. Corey's orders. 

## 2021-11-09 DIAGNOSIS — Z8052 Family history of malignant neoplasm of bladder: Secondary | ICD-10-CM | POA: Diagnosis not present

## 2021-11-09 DIAGNOSIS — Z0000 Encounter for general adult medical examination without abnormal findings: Secondary | ICD-10-CM | POA: Diagnosis not present

## 2021-11-09 DIAGNOSIS — Z79899 Other long term (current) drug therapy: Secondary | ICD-10-CM | POA: Diagnosis not present

## 2021-11-09 DIAGNOSIS — Z1159 Encounter for screening for other viral diseases: Secondary | ICD-10-CM | POA: Diagnosis not present

## 2021-11-09 DIAGNOSIS — Z13228 Encounter for screening for other metabolic disorders: Secondary | ICD-10-CM | POA: Diagnosis not present

## 2021-11-09 DIAGNOSIS — Z13220 Encounter for screening for lipoid disorders: Secondary | ICD-10-CM | POA: Diagnosis not present

## 2021-11-09 DIAGNOSIS — F17210 Nicotine dependence, cigarettes, uncomplicated: Secondary | ICD-10-CM | POA: Diagnosis not present

## 2021-11-09 DIAGNOSIS — Z8249 Family history of ischemic heart disease and other diseases of the circulatory system: Secondary | ICD-10-CM | POA: Diagnosis not present

## 2021-11-09 DIAGNOSIS — Z8679 Personal history of other diseases of the circulatory system: Secondary | ICD-10-CM | POA: Diagnosis not present

## 2021-11-09 DIAGNOSIS — Z1329 Encounter for screening for other suspected endocrine disorder: Secondary | ICD-10-CM | POA: Diagnosis not present

## 2021-11-09 DIAGNOSIS — Z9889 Other specified postprocedural states: Secondary | ICD-10-CM | POA: Diagnosis not present

## 2021-11-09 DIAGNOSIS — M1A09X0 Idiopathic chronic gout, multiple sites, without tophus (tophi): Secondary | ICD-10-CM | POA: Diagnosis not present

## 2021-11-09 DIAGNOSIS — Z2821 Immunization not carried out because of patient refusal: Secondary | ICD-10-CM | POA: Diagnosis not present

## 2021-11-09 LAB — HM HEPATITIS C SCREENING LAB: HM Hepatitis Screen: NEGATIVE

## 2021-12-05 DIAGNOSIS — S29012A Strain of muscle and tendon of back wall of thorax, initial encounter: Secondary | ICD-10-CM | POA: Diagnosis not present

## 2022-02-22 ENCOUNTER — Telehealth: Payer: Self-pay

## 2022-02-22 NOTE — Telephone Encounter (Signed)
LVM instructions for pt to call to schedule CPE. ?

## 2022-05-08 DIAGNOSIS — B349 Viral infection, unspecified: Secondary | ICD-10-CM | POA: Diagnosis not present

## 2022-06-05 DIAGNOSIS — Z96651 Presence of right artificial knee joint: Secondary | ICD-10-CM | POA: Diagnosis not present

## 2022-07-31 ENCOUNTER — Other Ambulatory Visit: Payer: Self-pay | Admitting: Family Medicine

## 2022-08-01 NOTE — Telephone Encounter (Signed)
Rx refill request approved per Dr. Corey's orders. 

## 2022-08-26 ENCOUNTER — Encounter: Payer: Self-pay | Admitting: Internal Medicine

## 2022-08-26 NOTE — Telephone Encounter (Signed)
Spoke with patient. First episode of afib since last seeing Dr. Rayann Heman in 2021. Pt back in normal rhythm now. Pt would prefer to re-establish with afib clinic. Appt made for tomorrow. Pt in agreement.

## 2022-08-27 ENCOUNTER — Ambulatory Visit (HOSPITAL_COMMUNITY)
Admission: RE | Admit: 2022-08-27 | Discharge: 2022-08-27 | Disposition: A | Discharge status: Home / Self Care | Payer: Self-pay | Source: Ambulatory Visit | Attending: Physician Assistant | Admitting: Physician Assistant

## 2022-08-27 ENCOUNTER — Encounter (HOSPITAL_COMMUNITY): Payer: Self-pay | Admitting: Physician Assistant

## 2022-08-27 VITALS — BP 118/88 | HR 78 | Ht 72.0 in | Wt 268.4 lb

## 2022-08-27 DIAGNOSIS — Z7901 Long term (current) use of anticoagulants: Secondary | ICD-10-CM | POA: Insufficient documentation

## 2022-08-27 DIAGNOSIS — I4819 Other persistent atrial fibrillation: Secondary | ICD-10-CM | POA: Insufficient documentation

## 2022-08-27 DIAGNOSIS — Z6836 Body mass index (BMI) 36.0-36.9, adult: Secondary | ICD-10-CM | POA: Insufficient documentation

## 2022-08-27 DIAGNOSIS — E669 Obesity, unspecified: Secondary | ICD-10-CM | POA: Insufficient documentation

## 2022-08-27 DIAGNOSIS — I10 Essential (primary) hypertension: Secondary | ICD-10-CM | POA: Insufficient documentation

## 2022-08-27 MED ORDER — DILTIAZEM HCL 30 MG PO TABS: ORAL_TABLET | ORAL | 1 refills | Status: AC

## 2022-08-27 NOTE — Progress Notes (Signed)
Primary Care Physician: Eulas Post, MD Primary Cardiologist: none Primary Electrophysiologist: Dr Rayann Heman Referring Physician: Dr Levan Hurst is a 51 y.o. male with a history of HTN and atrial fibrillation who presents for follow up in the [facility], seen remotely by Roderic Palau and Dr Rayann Heman. The patient underwent an afib ablation with Dr Rayann Heman 04/03/2017. Patient has a CHADS2VASC score of 1.  On follow up today, patient reports that yesterday he woke with afib around 4 am. He took two doses of his PRN diltiazem (which were expired) but his afib did not resolve for about 5-6 hours. He had symptoms of fatigue, SOB, and intermittent dizziness. Prior to this episode, he has had a very low burden of afib with episodes usually lasting < 15 minutes. There were no specific triggers that he could identify.   Today, he denies symptoms of chest pain, orthopnea, PND, lower extremity edema, presyncope, syncope, snoring, daytime somnolence, bleeding, or neurologic sequela. The patient is tolerating medications without difficulties and is otherwise without complaint today.    Atrial Fibrillation Risk Factors:  he does not have symptoms or diagnosis of sleep apnea. he does not have a history of rheumatic fever.   he has a BMI of Body mass index is 36.4 kg/m.Marland Kitchen Filed Weights   08/27/22 1514  Weight: 121.7 kg    Family History  Problem Relation Age of Onset   Meniere's disease Mother    Bladder Cancer Father    Hypertension Maternal Grandmother    Cancer Maternal Grandfather        lung ca   Adrenal disorder Neg Hx      Atrial Fibrillation Management history:  Previous antiarrhythmic drugs: flecainide  Previous cardioversions: 04/10/17, 04/28/17 Previous ablations: 04/03/17 CHADS2VASC score: 1 Anticoagulation history: Xarelto    Past Medical History:  Diagnosis Date   Anxiety    when he was in A-fib, attributed to adrenal gland and  fatigue   Arthritis    legs, ankles   GERD (gastroesophageal reflux disease)    Hyperaldosteronism ([place])    Hypertension    Melanoma of lower back ([place]) 2000s   Migraine    none for years   Persistent atrial fibrillation ([place])    Pneumonia    "a few times; last time ~ 2010" (04/03/2017)   Past Surgical History:  Procedure Laterality Date   ATRIAL FIBRILLATION ABLATION N/A 04/03/2017   Procedure: Atrial Fibrillation Ablation;  Surgeon: Thompson Grayer, MD;  Location: [facility];  Service: Cardiovascular;  Laterality: N/A;   CARDIOVERSION N/A 09/13/2016   Procedure: CARDIOVERSION;  Surgeon: Fay Records, MD;  Location: [place];  Service: Cardiovascular;  Laterality: N/A;   CARDIOVERSION N/A 10/02/2016   Procedure: CARDIOVERSION;  Surgeon: Larey Dresser, MD;  Location: [place];  Service: Cardiovascular;  Laterality: N/A;   CARDIOVERSION N/A 04/10/2017   Procedure: CARDIOVERSION;  Surgeon: Fay Records, MD;  Location: [place];  Service: Cardiovascular;  Laterality: N/A;   CARDIOVERSION N/A 04/28/2017   Procedure: CARDIOVERSION;  Surgeon: Pixie Casino, MD;  Location: [place];  Service: Cardiovascular;  Laterality: N/A;   HARDWARE REMOVAL Right 12/12/2015   Procedure: HARDWARE REMOVAL RIGHT TIBIAL;  Surgeon: Altamese Ryan, MD;  Location: [place];  Service: Orthopedics;  Laterality: Right;   IR US GUIDE VASC ACCESS RIGHT  07/16/2017   IR US GUIDE VASC ACCESS RIGHT  07/16/2017   IR VENOGRAM ADRENAL BI  07/16/2017   IR  VENOGRAM RENAL UNI LEFT  07/16/2017   IR VENOUS SAMPLING  07/16/2017   IR VENOUS SAMPLING  07/16/2017   JOINT REPLACEMENT     LAPAROSCOPIC ADRENALECTOMY Left 09/16/2017   LAPAROSCOPIC ADRENALECTOMY Left 09/16/2017   Procedure: LAPAROSCOPIC LEFT ADRENALECTOMY;  Surgeon: Armandina Gemma, MD;  Location: [place];  Service: General;  Laterality: Left;   MELANOMA EXCISION     "back"   ORIF NONUNION / MALUNION TIBIAL FRACTURE Right 2005   "put plates & screws in"    TOTAL KNEE ARTHROPLASTY Right 03/19/2016   Procedure: RIGHT TOTAL KNEE ARTHROPLASTY;  Surgeon: Paralee Cancel, MD;  Location: [facility];  Service: Orthopedics;  Laterality: Right;   VASECTOMY      Current Outpatient Medications  Medication Sig Dispense Refill   allopurinol (ZYLOPRIM) 300 MG tablet TAKE 1 TABLET BY MOUTH EVERY DAY 90 tablet 3   colchicine 0.6 MG tablet TAKE 1 TABLET (0.6 MG TOTAL) BY MOUTH 2 (TWO) TIMES DAILY. TO PREVENT GOUT 180 tablet 0   diltiazem (CARDIZEM) 30 MG tablet Take 1 Tablet Every 4 Hours As Needed For HR >100 and TOP BP >100 45 tablet 1   esomeprazole (NEXIUM) 20 MG capsule Take 20 mg by mouth daily.     No current facility-administered medications for this encounter.    No Known Allergies  Social History   Socioeconomic History   Marital status: Married    Spouse name: Not on file   Number of children: Not on file   Years of education: Not on file   Highest education level: Not on file  Occupational History   Not on file  Tobacco Use   Smoking status: Former    Packs/day: 0.50    Years: 2.00    Total pack years: 1.00    Types: Cigarettes    Quit date: 09/20/2010    Years since quitting: 11.9   Smokeless tobacco: Former    Types: Chew    Quit date: 11/03/2014   Tobacco comments:    Former smoker and chew 08/27/22  Vaping Use   Vaping Use: Never used  Substance and Sexual Activity   Alcohol use: Yes    Alcohol/week: 3.0 standard drinks of alcohol    Types: 3 Standard drinks or equivalent per week    Comment: 3 drinks a month 08/27/22   Drug use: No   Sexual activity: Yes  Other Topics Concern   Not on file  Social History Narrative   Not on file   Social Determinants of Health   Financial Resource Strain: Not on file  Food Insecurity: Not on file  Transportation Needs: Not on file  Physical Activity: Not on file  Stress: Not on file  Social Connections: Not on file  Intimate Partner Violence: Not on file     ROS- All systems are  reviewed and negative except as per the HPI above.  Physical Exam: Vitals:   08/27/22 1514  BP: 118/88  Pulse: 78  Weight: 121.7 kg  Height: 6' (1.829 m)    GEN- The patient is a well appearing obese male, alert and oriented x 3 today.   Head- normocephalic, atraumatic Eyes-  Sclera clear, conjunctiva pink Ears- hearing intact Oropharynx- clear Neck- supple  Lungs- Clear to ausculation bilaterally, normal work of breathing Heart- Regular rate and rhythm, no murmurs, rubs or gallops  GI- soft, NT, ND, + BS Extremities- no clubbing, cyanosis, or edema MS- no significant deformity or atrophy Skin- no rash or lesion Psych- euthymic mood,  full affect Neuro- strength and sensation are intact  Wt Readings from Last 3 Encounters:  08/27/22 121.7 kg  06/23/20 132 kg  05/15/20 131.5 kg    EKG today demonstrates  SR Vent. rate 78 BPM PR interval 178 ms QRS duration 94 ms QT/QTcB 376/428 ms  Echo 08/20/16 demonstrated  Left ventricle: The cavity size was normal. Wall thickness was    increased in a pattern of mild LVH. Systolic function was normal.    The estimated ejection fraction was in the range of 50% to 55%.    Wall motion was normal; there were no regional wall motion    abnormalities. There was no evidence of elevated ventricular    filling pressure by Doppler parameters.  - Aorta: Ascending aortic diameter: 43 mm (S).  - Ascending aorta: The ascending aorta was mildly dilated.  - Left atrium: Volume/bsa, ES (1-plane Simpson&'s, A4C): 30.2    ml/m^2.  - Right ventricle: The cavity size was mildly dilated. Wall    thickness was normal.   Epic records are reviewed at length today  CHA2DS2-VASc Score = 1  The patient's score is based upon: CHF History: 0 HTN History: 1 Diabetes History: 0 Stroke History: 0 Vascular Disease History: 0 Age Score: 0 Gender Score: 0       ASSESSMENT AND PLAN: 1. Persistent Atrial Fibrillation (ICD10:  I48.19) The patient's  CHA2DS2-VASc score is 1, indicating a 0.6% annual risk of stroke.   S/p afib ablation 04/03/17 We discussed rhythm control options today. Given his typically very low afib burden will not make any medication changes. If he has more frequent or persistent afib, could consider repeat ablation.  Will send new Rx of diltiazem 30 mg PRN q 4 hours for heart racing.  Not currently on anticoagulation with low CV score.  2. HTN Per chart history. BP controlled today on no medications.   3. Obesity Body mass index is 36.4 kg/m. Lifestyle modification was discussed at length including regular exercise and weight reduction.   Follow up in the AF clinic in one year.    [facility] [address] [place],  [address] [phone] 08/27/2022 3:45 PM

## 2023-04-04 ENCOUNTER — Ambulatory Visit (HOSPITAL_COMMUNITY)
Admission: RE | Admit: 2023-04-04 | Discharge: 2023-04-04 | Disposition: A | Discharge status: Home / Self Care | Payer: BC Managed Care – PPO | Source: Ambulatory Visit | Attending: Physician Assistant | Admitting: Physician Assistant

## 2023-04-04 ENCOUNTER — Encounter (HOSPITAL_COMMUNITY): Payer: Self-pay | Admitting: Physician Assistant

## 2023-04-04 VITALS — BP 122/76 | HR 102 | Ht 72.0 in | Wt 275.2 lb

## 2023-04-04 DIAGNOSIS — F17210 Nicotine dependence, cigarettes, uncomplicated: Secondary | ICD-10-CM | POA: Insufficient documentation

## 2023-04-04 DIAGNOSIS — Z6837 Body mass index (BMI) 37.0-37.9, adult: Secondary | ICD-10-CM | POA: Diagnosis not present

## 2023-04-04 DIAGNOSIS — E669 Obesity, unspecified: Secondary | ICD-10-CM | POA: Diagnosis not present

## 2023-04-04 DIAGNOSIS — I10 Essential (primary) hypertension: Secondary | ICD-10-CM | POA: Diagnosis not present

## 2023-04-04 DIAGNOSIS — I4819 Other persistent atrial fibrillation: Secondary | ICD-10-CM | POA: Insufficient documentation

## 2023-04-04 LAB — BASIC METABOLIC PANEL
Anion gap: 10 (ref 5–15)
BUN: 17 mg/dL (ref 6–20)
CO2: 22 mmol/L (ref 22–32)
Calcium: 8.7 mg/dL — ABNORMAL LOW (ref 8.9–10.3)
Chloride: 107 mmol/L (ref 98–111)
Creatinine, Ser: 1.35 mg/dL — ABNORMAL HIGH (ref 0.61–1.24)
GFR, Estimated: >60 mL/min (ref >60)
Glucose, Bld: 102 mg/dL — ABNORMAL HIGH (ref 70–99)
Potassium: 4.1 mmol/L (ref 3.5–5.1)
Sodium: 139 mmol/L (ref 135–145)

## 2023-04-04 LAB — CBC
HCT: 48 % (ref 39.0–52.0)
Hemoglobin: 16.4 g/dL (ref 13.0–17.0)
MCH: 31.9 pg (ref 26.0–34.0)
MCHC: 34.2 g/dL (ref 30.0–36.0)
MCV: 93.4 fL (ref 80.0–100.0)
Platelets: 256 10*3/uL (ref 150–400)
RBC: 5.14 MIL/uL (ref 4.22–5.81)
RDW: 13.1 % (ref 11.5–15.5)
WBC: 7 10*3/uL (ref 4.0–10.5)
nRBC: 0 % (ref 0.0–0.2)

## 2023-04-04 MED ORDER — RIVAROXABAN 20 MG PO TABS: 20.0000 mg | ORAL_TABLET | Freq: Every day | ORAL | 3 refills | Status: DC

## 2023-04-04 MED ORDER — DILTIAZEM HCL ER COATED BEADS 120 MG PO CP24: 120.0000 mg | ORAL_CAPSULE | Freq: Every day | ORAL | 3 refills | Status: DC

## 2023-04-04 NOTE — H&P (View-Only) (Signed)
  Primary Care Physician: Burchette, Bruce W, MD Primary Cardiologist: none Primary Electrophysiologist: Dr Allred Referring Physician: Dr Allred   Gregory Barrett is a 52 y.o. male with a history of HTN and atrial fibrillation who presents for follow up in the [facility], seen remotely by Donna Carroll and Dr Allred. The patient underwent an afib ablation with Dr Allred 04/03/2017. Patient has a CHADS2VASC score of 1.  On follow up today, patient reports that he went back into afib on 03/27/23 with tachypalpitations and fatigue. He has been using his PRN diltiazem but his afib has persisted. There were no specific triggers that he could identify.   Today, he denies symptoms of chest pain, orthopnea, PND, lower extremity edema, presyncope, syncope, snoring, daytime somnolence, bleeding, or neurologic sequela. The patient is tolerating medications without difficulties and is otherwise without complaint today.    Atrial Fibrillation Risk Factors:  he does not have symptoms or diagnosis of sleep apnea. he does not have a history of rheumatic fever.   he has a BMI of Body mass index is 37.32 kg/m.. Filed Weights   04/04/23 1000  Weight: 124.8 kg    Family History  Problem Relation Age of Onset   Meniere's disease Mother    Bladder Cancer Father    Hypertension Maternal Grandmother    Cancer Maternal Grandfather        lung ca   Adrenal disorder Neg Hx      Atrial Fibrillation Management history:  Previous antiarrhythmic drugs: flecainide  Previous cardioversions: 04/10/17, 04/28/17 Previous ablations: 04/03/17 CHADS2VASC score: 1 Anticoagulation history: Xarelto    Past Medical History:  Diagnosis Date   Anxiety    when he was in A-fib, attributed to adrenal gland and fatigue   Arthritis    legs, ankles   GERD (gastroesophageal reflux disease)    Hyperaldosteronism ([facility])    Hypertension    Melanoma of lower back ([facility]) 2000s   Migraine     none for years   Persistent atrial fibrillation ([facility])    Pneumonia    "a few times; last time ~ 2010" (04/03/2017)   Past Surgical History:  Procedure Laterality Date   ATRIAL FIBRILLATION ABLATION N/A 04/03/2017   Procedure: Atrial Fibrillation Ablation;  Surgeon: Allred, James, MD;  [facility];  Service: Cardiovascular;  Laterality: N/A;   CARDIOVERSION N/A 09/13/2016   Procedure: CARDIOVERSION;  Surgeon: Paula V Ross, MD;  [facility];  Service: Cardiovascular;  Laterality: N/A;   CARDIOVERSION N/A 10/02/2016   Procedure: CARDIOVERSION;  Surgeon: Dalton S McLean, MD;  [facility];  Service: Cardiovascular;  Laterality: N/A;   CARDIOVERSION N/A 04/10/2017   Procedure: CARDIOVERSION;  Surgeon: Ross, Paula V, MD;  [facility];  Service: Cardiovascular;  Laterality: N/A;   CARDIOVERSION N/A 04/28/2017   Procedure: CARDIOVERSION;  Surgeon: Hilty, Kenneth C, MD;  [facility];  Service: Cardiovascular;  Laterality: N/A;   HARDWARE REMOVAL Right 12/12/2015   Procedure: HARDWARE REMOVAL RIGHT TIBIAL;  Surgeon: Michael Handy, MD;  [facility];  Service: Orthopedics;  Laterality: Right;   IR US GUIDE VASC ACCESS RIGHT  07/16/2017   IR US GUIDE VASC ACCESS RIGHT  07/16/2017   IR VENOGRAM ADRENAL BI  07/16/2017   IR VENOGRAM RENAL UNI LEFT  07/16/2017   IR VENOUS SAMPLING  07/16/2017   IR VENOUS SAMPLING  07/16/2017   JOINT REPLACEMENT     LAPAROSCOPIC ADRENALECTOMY Left 09/16/2017   LAPAROSCOPIC   ADRENALECTOMY Left 09/16/2017   Procedure: LAPAROSCOPIC LEFT ADRENALECTOMY;  Surgeon: Gerkin, Todd, MD;  [facility];  Service: General;  Laterality: Left;   MELANOMA EXCISION     "back"   ORIF NONUNION / MALUNION TIBIAL FRACTURE Right 2005   "put plates & screws in"   TOTAL KNEE ARTHROPLASTY Right 03/19/2016   Procedure: RIGHT TOTAL KNEE ARTHROPLASTY;  Surgeon: Matthew Olin, MD;  Location: [facility];  Service: Orthopedics;  Laterality: Right;    VASECTOMY      Current Outpatient Medications  Medication Sig Dispense Refill   allopurinol (ZYLOPRIM) 300 MG tablet TAKE 1 TABLET BY MOUTH EVERY DAY 90 tablet 3   colchicine 0.6 MG tablet TAKE 1 TABLET (0.6 MG TOTAL) BY MOUTH 2 (TWO) TIMES DAILY. TO PREVENT GOUT (Patient taking differently: Take 0.6 mg by mouth as needed. To prevent gout) 180 tablet 0   diltiazem (CARDIZEM) 30 MG tablet Take 1 Tablet Every 4 Hours As Needed For HR >100 and TOP BP >100 45 tablet 1   esomeprazole (NEXIUM) 20 MG capsule Take 20 mg by mouth daily.     No current facility-administered medications for this encounter.    No Known Allergies  Social History   Socioeconomic History   Marital status: Married    Spouse name: Not on file   Number of children: Not on file   Years of education: Not on file   Highest education level: Not on file  Occupational History   Not on file  Tobacco Use   Smoking status: Former    Packs/day: 0.50    Years: 2.00    Additional pack years: 0.00    Total pack years: 1.00    Types: Cigarettes    Quit date: 09/20/2010    Years since quitting: 12.5   Smokeless tobacco: Former    Types: Chew    Quit date: 11/03/2014   Tobacco comments:    Former smoker and chew 08/27/22  Vaping Use   Vaping Use: Every day   Substances: Nicotine  Substance and Sexual Activity   Alcohol use: Yes    Alcohol/week: 3.0 standard drinks of alcohol    Types: 3 Standard drinks or equivalent per week    Comment: 3 drinks a month 08/27/22   Drug use: No   Sexual activity: Yes  Other Topics Concern   Not on file  Social History Narrative   Not on file   Social Determinants of Health   Financial Resource Strain: Not on file  Food Insecurity: Not on file  Transportation Needs: Not on file  Physical Activity: Not on file  Stress: Not on file  Social Connections: Not on file  Intimate Partner Violence: Not on file     ROS- All systems are reviewed and negative except as per the HPI  above.  Physical Exam: Vitals:   04/04/23 1000  BP: 122/76  Pulse: (!) 102  Weight: 124.8 kg  Height: 6' (1.829 m)    GEN- The patient is a well appearing male, alert and oriented x 3 today.   HEENT-head normocephalic, atraumatic, sclera clear, conjunctiva pink, hearing intact, trachea midline. Lungs- Clear to ausculation bilaterally, normal work of breathing Heart- irregular rate and rhythm, no murmurs, rubs or gallops  GI- soft, NT, ND, + BS Extremities- no clubbing, cyanosis, or edema MS- no significant deformity or atrophy Skin- no rash or lesion Psych- euthymic mood, full affect Neuro- strength and sensation are intact   Wt Readings from Last 3 Encounters:    04/04/23 124.8 kg  08/27/22 121.7 kg  06/23/20 132 kg    EKG today demonstrates  Afib, PVCs Vent. rate 102 BPM PR interval * ms QRS duration 90 ms QT/QTcB 336/437 ms  Echo 08/20/16 demonstrated  Left ventricle: The cavity size was normal. Wall thickness was    increased in a pattern of mild LVH. Systolic function was normal.    The estimated ejection fraction was in the range of 50% to 55%.    Wall motion was normal; there were no regional wall motion    abnormalities. There was no evidence of elevated ventricular    filling pressure by Doppler parameters.  - Aorta: Ascending aortic diameter: 43 mm (S).  - Ascending aorta: The ascending aorta was mildly dilated.  - Left atrium: Volume/bsa, ES (1-plane Simpson&'s, A4C): 30.2    ml/m^2.  - Right ventricle: The cavity size was mildly dilated. Wall    thickness was normal.   Epic records are reviewed at length today  CHA2DS2-VASc Score = 1  The patient's score is based upon: CHF History: 0 HTN History: 1 Diabetes History: 0 Stroke History: 0 Vascular Disease History: 0 Age Score: 0 Gender Score: 0       ASSESSMENT AND PLAN: 1. Persistent Atrial Fibrillation (ICD10:  I48.19) The patient's CHA2DS2-VASc score is 1, indicating a 0.6% annual risk of  stroke.   S/p afib ablation 04/03/17 Patient back in persistent afib with borderline elevated heat rates.  We discussed rhythm control options today.  Will resume Xarelto 20 mg daily and diltiazem 120 mg daily. After 3 weeks of uninterrupted anticoagulation, will schedule DCCV.  Long term, patient would like to establish care with new EP (former Allred pt) and discuss possibility of repeat ablation. He had no afib from ablation until the past year and now has had two episodes, this time persistent.  Continue diltiazem 30 mg PRN q 4 hours for heart racing.   2. HTN Per chart history.  BP controlled today on no medications.   3. Obesity Body mass index is 37.32 kg/m. Lifestyle modification was discussed and encouraged including regular physical activity and weight reduction.   Follow up with EP to establish care/discuss repeat ablation.    Ricky Ronique Simerly PA-C [facility] [address] [place], [place] [address] [phone] 04/04/2023 10:17 AM 

## 2023-04-04 NOTE — Patient Instructions (Addendum)
Start Xarelto 20mg  once a day with supper   Start Cardizem 120mg  once a day   Cardioversion scheduled for: Friday, June 28th   - Arrive at the [facility] and go to admitting at 8am   - Do not eat or drink anything after midnight the night prior to your procedure.   - Take all your morning medication (except diabetic medications) with a sip of water prior to arrival.  - You will not be able to drive home after your procedure.    - Do NOT miss any doses of your blood thinner - if you should miss a dose please notify our office immediately.   - If you feel as if you go back into normal rhythm prior to scheduled cardioversion, please notify our office immediately.   If your procedure is canceled in the cardioversion suite you will be charged a cancellation fee.

## 2023-04-04 NOTE — Progress Notes (Signed)
Primary Care Physician: Kristian Covey, MD Primary Cardiologist: none Primary Electrophysiologist: Dr Johney Frame Referring Physician: Dr Rob Bunting is a 52 y.o. male with a history of HTN and atrial fibrillation who presents for follow up in the [facility], seen remotely by Rudi Coco and Dr Johney Frame. The patient underwent an afib ablation with Dr Johney Frame 04/03/2017. Patient has a CHADS2VASC score of 1.  On follow up today, patient reports that he went back into afib on 03/27/23 with tachypalpitations and fatigue. He has been using his PRN diltiazem but his afib has persisted. There were no specific triggers that he could identify.   Today, he denies symptoms of chest pain, orthopnea, PND, lower extremity edema, presyncope, syncope, snoring, daytime somnolence, bleeding, or neurologic sequela. The patient is tolerating medications without difficulties and is otherwise without complaint today.    Atrial Fibrillation Risk Factors:  he does not have symptoms or diagnosis of sleep apnea. he does not have a history of rheumatic fever.   he has a BMI of Body mass index is 37.32 kg/m.Marland Kitchen Filed Weights   04/04/23 1000  Weight: 124.8 kg    Family History  Problem Relation Age of Onset   Meniere's disease Mother    Bladder Cancer Father    Hypertension Maternal Grandmother    Cancer Maternal Grandfather        lung ca   Adrenal disorder Neg Hx      Atrial Fibrillation Management history:  Previous antiarrhythmic drugs: flecainide  Previous cardioversions: 04/10/17, 04/28/17 Previous ablations: 04/03/17 CHADS2VASC score: 1 Anticoagulation history: Xarelto    Past Medical History:  Diagnosis Date   Anxiety    when he was in A-fib, attributed to adrenal gland and fatigue   Arthritis    legs, ankles   GERD (gastroesophageal reflux disease)    Hyperaldosteronism (HCC)    Hypertension    Melanoma of lower back (HCC) 2000s   Migraine     none for years   Persistent atrial fibrillation (HCC)    Pneumonia    "a few times; last time ~ 2010" (04/03/2017)   Past Surgical History:  Procedure Laterality Date   ATRIAL FIBRILLATION ABLATION N/A 04/03/2017   Procedure: Atrial Fibrillation Ablation;  Surgeon: Hillis Range, MD;  Location: [facility] INVASIVE CV LAB;  Service: Cardiovascular;  Laterality: N/A;   CARDIOVERSION N/A 09/13/2016   Procedure: CARDIOVERSION;  Surgeon: Pricilla Riffle, MD;  Location: [facility] ENDOSCOPY;  Service: Cardiovascular;  Laterality: N/A;   CARDIOVERSION N/A 10/02/2016   Procedure: CARDIOVERSION;  Surgeon: Laurey Morale, MD;  Location: [facility] ENDOSCOPY;  Service: Cardiovascular;  Laterality: N/A;   CARDIOVERSION N/A 04/10/2017   Procedure: CARDIOVERSION;  Surgeon: Pricilla Riffle, MD;  Location: [facility] ENDOSCOPY;  Service: Cardiovascular;  Laterality: N/A;   CARDIOVERSION N/A 04/28/2017   Procedure: CARDIOVERSION;  Surgeon: Chrystie Nose, MD;  Location: [facility] ENDOSCOPY;  Service: Cardiovascular;  Laterality: N/A;   HARDWARE REMOVAL Right 12/12/2015   Procedure: HARDWARE REMOVAL RIGHT TIBIAL;  Surgeon: Myrene Galas, MD;  Location: [facility] OR;  Service: Orthopedics;  Laterality: Right;   IR US GUIDE VASC ACCESS RIGHT  07/16/2017   IR US GUIDE VASC ACCESS RIGHT  07/16/2017   IR VENOGRAM ADRENAL BI  07/16/2017   IR VENOGRAM RENAL UNI LEFT  07/16/2017   IR VENOUS SAMPLING  07/16/2017   IR VENOUS SAMPLING  07/16/2017   JOINT REPLACEMENT     LAPAROSCOPIC ADRENALECTOMY Left 09/16/2017   LAPAROSCOPIC  ADRENALECTOMY Left 09/16/2017   Procedure: LAPAROSCOPIC LEFT ADRENALECTOMY;  Surgeon: Darnell Level, MD;  Location: [facility] OR;  Service: General;  Laterality: Left;   MELANOMA EXCISION     "back"   ORIF NONUNION / MALUNION TIBIAL FRACTURE Right 2005   "put plates & screws in"   TOTAL KNEE ARTHROPLASTY Right 03/19/2016   Procedure: RIGHT TOTAL KNEE ARTHROPLASTY;  Surgeon: Durene Romans, MD;  Location: [facility] ORS;  Service: Orthopedics;  Laterality: Right;    VASECTOMY      Current Outpatient Medications  Medication Sig Dispense Refill   allopurinol (ZYLOPRIM) 300 MG tablet TAKE 1 TABLET BY MOUTH EVERY DAY 90 tablet 3   colchicine 0.6 MG tablet TAKE 1 TABLET (0.6 MG TOTAL) BY MOUTH 2 (TWO) TIMES DAILY. TO PREVENT GOUT (Patient taking differently: Take 0.6 mg by mouth as needed. To prevent gout) 180 tablet 0   diltiazem (CARDIZEM) 30 MG tablet Take 1 Tablet Every 4 Hours As Needed For HR >100 and TOP BP >100 45 tablet 1   esomeprazole (NEXIUM) 20 MG capsule Take 20 mg by mouth daily.     No current facility-administered medications for this encounter.    No Known Allergies  Social History   Socioeconomic History   Marital status: Married    Spouse name: Not on file   Number of children: Not on file   Years of education: Not on file   Highest education level: Not on file  Occupational History   Not on file  Tobacco Use   Smoking status: Former    Packs/day: 0.50    Years: 2.00    Additional pack years: 0.00    Total pack years: 1.00    Types: Cigarettes    Quit date: 09/20/2010    Years since quitting: 12.5   Smokeless tobacco: Former    Types: Chew    Quit date: 11/03/2014   Tobacco comments:    Former smoker and chew 08/27/22  Vaping Use   Vaping Use: Every day   Substances: Nicotine  Substance and Sexual Activity   Alcohol use: Yes    Alcohol/week: 3.0 standard drinks of alcohol    Types: 3 Standard drinks or equivalent per week    Comment: 3 drinks a month 08/27/22   Drug use: No   Sexual activity: Yes  Other Topics Concern   Not on file  Social History Narrative   Not on file   Social Determinants of Health   Financial Resource Strain: Not on file  Food Insecurity: Not on file  Transportation Needs: Not on file  Physical Activity: Not on file  Stress: Not on file  Social Connections: Not on file  Intimate Partner Violence: Not on file     ROS- All systems are reviewed and negative except as per the HPI  above.  Physical Exam: Vitals:   04/04/23 1000  BP: 122/76  Pulse: (!) 102  Weight: 124.8 kg  Height: 6' (1.829 m)    GEN- The patient is a well appearing male, alert and oriented x 3 today.   HEENT-head normocephalic, atraumatic, sclera clear, conjunctiva pink, hearing intact, trachea midline. Lungs- Clear to ausculation bilaterally, normal work of breathing Heart- irregular rate and rhythm, no murmurs, rubs or gallops  GI- soft, NT, ND, + BS Extremities- no clubbing, cyanosis, or edema MS- no significant deformity or atrophy Skin- no rash or lesion Psych- euthymic mood, full affect Neuro- strength and sensation are intact   Wt Readings from Last 3 Encounters:  04/04/23 124.8 kg  08/27/22 121.7 kg  06/23/20 132 kg    EKG today demonstrates  Afib, PVCs Vent. rate 102 BPM PR interval * ms QRS duration 90 ms QT/QTcB 336/437 ms  Echo 08/20/16 demonstrated  Left ventricle: The cavity size was normal. Wall thickness was    increased in a pattern of mild LVH. Systolic function was normal.    The estimated ejection fraction was in the range of 50% to 55%.    Wall motion was normal; there were no regional wall motion    abnormalities. There was no evidence of elevated ventricular    filling pressure by Doppler parameters.  - Aorta: Ascending aortic diameter: 43 mm (S).  - Ascending aorta: The ascending aorta was mildly dilated.  - Left atrium: Volume/bsa, ES (1-plane Simpson&'s, A4C): 30.2    ml/m^2.  - Right ventricle: The cavity size was mildly dilated. Wall    thickness was normal.   Epic records are reviewed at length today  CHA2DS2-VASc Score = 1  The patient's score is based upon: CHF History: 0 HTN History: 1 Diabetes History: 0 Stroke History: 0 Vascular Disease History: 0 Age Score: 0 Gender Score: 0       ASSESSMENT AND PLAN: 1. Persistent Atrial Fibrillation (ICD10:  I48.19) The patient's CHA2DS2-VASc score is 1, indicating a 0.6% annual risk of  stroke.   S/p afib ablation 04/03/17 Patient back in persistent afib with borderline elevated heat rates.  We discussed rhythm control options today.  Will resume Xarelto 20 mg daily and diltiazem 120 mg daily. After 3 weeks of uninterrupted anticoagulation, will schedule DCCV.  Long term, patient would like to establish care with new EP (former [facility] pt) and discuss possibility of repeat ablation. He had no afib from ablation until the past year and now has had two episodes, this time persistent.  Continue diltiazem 30 mg PRN q 4 hours for heart racing.   2. HTN Per chart history.  BP controlled today on no medications.   3. Obesity Body mass index is 37.32 kg/m. Lifestyle modification was discussed and encouraged including regular physical activity and weight reduction.   Follow up with EP to establish care/discuss repeat ablation.    Jorja Loa PA-C Afib Clinic [facility] [address] [place], [place] [address] [phone] 04/04/2023 10:17 AM

## 2023-04-24 NOTE — Progress Notes (Signed)
Spoke to pt and instructed them to come at 0800 and to be NPO after 0000. Confirmed no missed doses of AC and instructed to take this evening.   Confirmed that pt will have a ride home and someone to stay with them for 24 hours after the procedure.

## 2023-04-25 ENCOUNTER — Encounter (HOSPITAL_COMMUNITY)
Admission: RE | Disposition: A | Discharge status: Home / Self Care | Payer: Self-pay | Source: Home / Self Care | Attending: Cardiology

## 2023-04-25 ENCOUNTER — Ambulatory Visit (HOSPITAL_COMMUNITY)
Admission: RE | Admit: 2023-04-25 | Discharge: 2023-04-25 | Disposition: A | Discharge status: Home / Self Care | Payer: BC Managed Care – PPO | Attending: Cardiology | Admitting: Cardiology

## 2023-04-25 ENCOUNTER — Other Ambulatory Visit: Payer: Self-pay

## 2023-04-25 ENCOUNTER — Ambulatory Visit (HOSPITAL_COMMUNITY): Payer: BC Managed Care – PPO | Admitting: Registered Nurse

## 2023-04-25 ENCOUNTER — Encounter (HOSPITAL_COMMUNITY): Payer: Self-pay | Admitting: Cardiology

## 2023-04-25 DIAGNOSIS — Z79899 Other long term (current) drug therapy: Secondary | ICD-10-CM | POA: Insufficient documentation

## 2023-04-25 DIAGNOSIS — Z7901 Long term (current) use of anticoagulants: Secondary | ICD-10-CM | POA: Insufficient documentation

## 2023-04-25 DIAGNOSIS — Z6837 Body mass index (BMI) 37.0-37.9, adult: Secondary | ICD-10-CM | POA: Insufficient documentation

## 2023-04-25 DIAGNOSIS — I4891 Unspecified atrial fibrillation: Secondary | ICD-10-CM | POA: Diagnosis not present

## 2023-04-25 DIAGNOSIS — I10 Essential (primary) hypertension: Secondary | ICD-10-CM | POA: Diagnosis not present

## 2023-04-25 DIAGNOSIS — E669 Obesity, unspecified: Secondary | ICD-10-CM | POA: Insufficient documentation

## 2023-04-25 DIAGNOSIS — I4819 Other persistent atrial fibrillation: Secondary | ICD-10-CM | POA: Insufficient documentation

## 2023-04-25 HISTORY — PX: CARDIOVERSION: SHX1299

## 2023-04-25 SURGERY — CARDIOVERSION
Anesthesia: General

## 2023-04-25 MED ORDER — PROPOFOL 10 MG/ML IV BOLUS
INTRAVENOUS | Status: DC | PRN
  Administered 2023-04-25: 20 mg via INTRAVENOUS
  Administered 2023-04-25: 30 mg via INTRAVENOUS
  Administered 2023-04-25: 40 mg via INTRAVENOUS
  Administered 2023-04-25: 50 mg via INTRAVENOUS

## 2023-04-25 MED ORDER — SODIUM CHLORIDE 0.9 % IV SOLN: INTRAVENOUS | Status: DC

## 2023-04-25 MED ORDER — LIDOCAINE 2% (20 MG/ML) 5 ML SYRINGE
INTRAMUSCULAR | Status: DC | PRN
  Administered 2023-04-25: 60 mg via INTRAVENOUS

## 2023-04-25 SURGICAL SUPPLY — 1 items: ELECT DEFIB PAD ADLT CADENCE (PAD) ×1 IMPLANT

## 2023-04-25 NOTE — Interval H&P Note (Signed)
History and Physical Interval Note:  04/25/2023 8:29 AM  [name]  has presented today for surgery, with the diagnosis of AFIB.  The various methods of treatment have been discussed with the patient and family. After consideration of risks, benefits and other options for treatment, the patient has consented to  Procedure(s): CARDIOVERSION (N/A) as a surgical intervention.  The patient's history has been reviewed, patient examined, no change in status, stable for surgery.  I have reviewed the patient's chart and labs.  Questions were answered to the patient's satisfaction.     Meriam Sprague

## 2023-04-25 NOTE — Transfer of Care (Signed)
Immediate Anesthesia Transfer of Care Note  Patient: Gregory Barrett  Procedure(s) Performed: CARDIOVERSION  Patient Location: PACU and Cath Lab  Anesthesia Type:MAC  Level of Consciousness: drowsy  Airway & Oxygen Therapy: Patient Spontanous Breathing  Post-op Assessment: Report given to RN and Post -op Vital signs reviewed and stable  Post vital signs: Reviewed and stable  Last Vitals:  Vitals Value Taken Time  BP 123/96 04/25/23 0830  Temp    Pulse 84 04/25/23 0831  Resp 22 04/25/23 0831  SpO2 98 % 04/25/23 0831  Vitals shown include unvalidated device data.  Last Pain:  Vitals:   04/25/23 0809  TempSrc: Temporal  PainSc: 0-No pain         Complications: There were no known notable events for this encounter.

## 2023-04-25 NOTE — CV Procedure (Signed)
Procedure: Electrical Cardioversion Indications:  Atrial Fibrillation  Procedure Details:  Consent: Risks of procedure as well as the alternatives and risks of each were explained to the (patient/caregiver).  Consent for procedure obtained.  Time Out: Verified patient identification, verified procedure, site/side was marked, verified correct patient position, special equipment/implants available, medications/allergies/relevent history reviewed, required imaging and test results available. PERFORMED.  Patient placed on cardiac monitor, pulse oximetry, supplemental oxygen as necessary.  Sedation given:  Propofol 160mg ; lidocaine 60mg  Pacer pads placed anterior and posterior chest.  Cardioverted 3 time(s).  Cardioversion with synchronized biphasic 200J shock.  Evaluation: Findings: Post procedure EKG shows: Atrial Fibrillation Complications: None Patient did tolerate procedure well.  Time Spent Directly with the Patient:    Meriam Sprague 04/25/2023, 8:42 AM

## 2023-04-25 NOTE — Anesthesia Preprocedure Evaluation (Signed)
Anesthesia Evaluation  Patient identified by MRN, date of birth, ID band Patient awake    Reviewed: Allergy & Precautions, NPO status , Patient's Chart, lab work & pertinent test results  History of Anesthesia Complications Negative for: history of anesthetic complications  Airway Mallampati: IV  TM Distance: >3 FB Neck ROM: Full    Dental  (+) Dental Advisory Given, Teeth Intact   Pulmonary shortness of breath, neg sleep apnea, neg COPD, neg recent URI, former smoker   breath sounds clear to auscultation       Cardiovascular hypertension, (-) angina (-) Past MI and (-) CHF + dysrhythmias Atrial Fibrillation  Rhythm:Irregular  Left ventricle: Systolic function was normal. The estimated    ejection fraction was in the range of 55% to 60%. Wall motion was    normal; there were no regional wall motion abnormalities.  - Left atrium: The atrium was dilated. No evidence of thrombus in    the atrial cavity or appendage. No evidence of thrombus in the    atrial cavity or appendage.  - Right atrium: The atrium was dilated. No evidence of thrombus in    the atrial cavity or appendage.  - Atrial septum: A patent foramen ovale cannot be excluded.     Neuro/Psych  Headaches PSYCHIATRIC DISORDERS Anxiety        GI/Hepatic Neg liver ROS,GERD  Medicated and Controlled,,  Endo/Other  negative endocrine ROS    Renal/GU negative Renal ROS     Musculoskeletal   Abdominal   Peds  Hematology  (+) Blood dyscrasia Lab Results      Component                Value               Date                      WBC                      7.0                 04/04/2023                HGB                      16.4                04/04/2023                HCT                      48.0                04/04/2023                MCV                      93.4                04/04/2023                PLT                      256                 04/04/2023             xarelto  Anesthesia Other Findings   Reproductive/Obstetrics                              Anesthesia Physical Anesthesia Plan  ASA: 2  Anesthesia Plan: General   Post-op Pain Management: Minimal or no pain anticipated   Induction: Intravenous  PONV Risk Score and Plan: 2 and Treatment may vary due to age or medical condition  Airway Management Planned: Nasal Cannula, Natural Airway and Simple Face Mask  Additional Equipment: None  Intra-op Plan:   Post-operative Plan:   Informed Consent: I have reviewed the patients History and Physical, chart, labs and discussed the procedure including the risks, benefits and alternatives for the proposed anesthesia with the patient or authorized representative who has indicated his/her understanding and acceptance.     Dental advisory given  Plan Discussed with: CRNA  Anesthesia Plan Comments:          Anesthesia Quick Evaluation

## 2023-04-28 ENCOUNTER — Encounter (HOSPITAL_COMMUNITY): Payer: Self-pay | Admitting: Cardiology

## 2023-04-29 NOTE — Anesthesia Postprocedure Evaluation (Signed)
Anesthesia Post Note  Patient: [name]  Procedure(s) Performed: CARDIOVERSION     Patient location during evaluation: Cath Lab Anesthesia Type: General Level of consciousness: awake and alert Pain management: pain level controlled Vital Signs Assessment: post-procedure vital signs reviewed and stable Respiratory status: spontaneous breathing, nonlabored ventilation and respiratory function stable Cardiovascular status: stable Postop Assessment: no apparent nausea or vomiting Anesthetic complications: no   There were no known notable events for this encounter.  Last Vitals:  Vitals:   04/25/23 0900 04/25/23 0910  BP: 117/65 111/67  Pulse: 78 73  Resp: 10 20  Temp: 36.8 C   SpO2: 96% 97%    Last Pain:  Vitals:   04/25/23 0910  TempSrc:   PainSc: 0-No pain                 Jossette Zirbel

## 2023-05-14 NOTE — Progress Notes (Signed)
Electrophysiology Office Follow up Visit Note:    Date:  05/15/2023   ID:  Gregory Barrett, DOB 1970-11-19, MRN [id number]  PCP:  Kristian Covey, MD  [facility] Cardiologist:  Hillis Range, MD (Inactive)  [facility] Electrophysiologist:  Lanier Prude, MD    Interval History:    Gregory Barrett is a 52 y.o. male who presents for a follow up visit.   Last seen April 04, 2023 by Clide Cliff.  The patient was previously followed by Dr. Johney Frame.  He had a prior ablation with Dr. Johney Frame on April 03, 2017.  At the appointment Clide Cliff he reported going back in atrial fibrillation on Mar 27, 2023.  He was set up for cardioversion which was performed on April 25, 2023.  This was not successful. He was restarted on Xarelto prior to the cardioversion.  He is referred to discuss the possibility of repeat catheter ablation.  He is highly symptomatic with his AF w RVR. He has fatigue. "Can't function". Works as an Retail banker. Wife is present today for appointment. No problems with his blood thinner.       Past medical, surgical, social and family history were reviewed.  ROS:   Please see the history of present illness.    All other systems reviewed and are negative.  EKGs/Labs/Other Studies Reviewed:    The following studies were reviewed today:  April 04, 2023 EKG shows atrial fibrillation with rapid ventricular rate  EKG Interpretation Date/Time:  Thursday May 15 2023 15:52:43 EDT Ventricular Rate:  101 PR Interval:    QRS Duration:  82 QT Interval:  324 QTC Calculation: 420 R Axis:   17  Text Interpretation: Atrial fibrillation with rapid ventricular response Confirmed by Steffanie Dunn [phone]) on 05/15/2023 3:56:05 PM    Physical Exam:    VS:  BP (!) 122/98   Pulse (!) 101   Ht 6' (1.829 m)   Wt 279 lb 9.6 oz (126.8 kg)   SpO2 98%   BMI 37.92 kg/m     Wt Readings from Last 3 Encounters:  05/15/23 279 lb 9.6 oz (126.8 kg)  04/25/23 270 lb (122.5 kg)  04/04/23 275  lb 3.2 oz (124.8 kg)     GEN:  Well nourished, well developed in no acute distress CARDIAC: irregularly irregular, no murmurs, rubs, gallops RESPIRATORY:  Clear to auscultation without rales, wheezing or rhonchi       ASSESSMENT:    1. Persistent atrial fibrillation (HCC)    PLAN:    In order of problems listed above:  #Persistent atrial fibrillation Prior A-fib ablation April 03, 2017 with Dr. Johney Frame.  Is now on Xarelto after recent cardioversion on April 25, 2023.  I discussed treatment options with the patient including antiarrhythmic drugs, continuing a watchful waiting/conservative approach or pursuing repeat catheter ablation.  He is very symptomatic. I discussed dofetilide and he wishes to proceed. Will plan for DCCV in the EP lab during dofetilde load with a 360J defibrillator. The tikosyn loading process including its risks were discussed in detail and he wishes to proceed.  Discussed treatment options today for AF including antiarrhythmic drug therapy and ablation. Discussed risks, recovery and likelihood of success with each treatment strategy. Risk, benefits, and alternatives to EP study and ablation for afib were discussed. These risks include but are not limited to stroke, bleeding, vascular damage, tamponade, perforation, damage to the esophagus, lungs, phrenic nerve and other structures, pulmonary vein stenosis, worsening renal function, coronary vasospasm and death.  Discussed potential need for repeat ablation procedures and antiarrhythmic drugs after an initial ablation. The patient understands these risk and wishes to proceed.  We will therefore proceed with catheter ablation at the next available time.  Carto, ICE, anesthesia are requested for the procedure.  Will also obtain CT PV protocol prior to the procedure to exclude LAA thrombus and further evaluate atrial anatomy.  #Obesity Weight loss discussed during today's visit. Discussed link to AF treatment success  rates.   Signed, Steffanie Dunn, MD, [facility], [facility] 05/15/2023 4:39 PM    Electrophysiology [facility]

## 2023-05-15 ENCOUNTER — Ambulatory Visit: Payer: BC Managed Care – PPO | Attending: Cardiology | Admitting: Cardiology

## 2023-05-15 ENCOUNTER — Encounter: Payer: Self-pay | Admitting: Cardiology

## 2023-05-15 VITALS — BP 122/98 | HR 101 | Ht 72.0 in | Wt 279.6 lb

## 2023-05-15 DIAGNOSIS — I4819 Other persistent atrial fibrillation: Secondary | ICD-10-CM | POA: Diagnosis not present

## 2023-05-15 NOTE — Patient Instructions (Addendum)
Medication Instructions:  Your physician recommends that you continue on your current medications as directed. Please refer to the Current Medication list given to you today.  *If you need a refill on your cardiac medications before your next appointment, please call your pharmacy*  Lab Work: BMET and CBC prior to CT and Ablation  Tests/Procedures Your physician has requested that you have cardiac CT. Cardiac computed tomography (CT) is a painless test that uses an x-ray machine to take clear, detailed pictures of your heart. For further information please visit [email or web address] We will call you to schedule your CT scan. It will be done about one week prior to your ablation.  Your physician has recommended that you have an ablation. Catheter ablation is a medical procedure used to treat some cardiac arrhythmias (irregular heartbeats). During catheter ablation, a long, thin, flexible tube is put into a blood vessel in your groin (upper thigh), or neck. This tube is called an ablation catheter. It is then guided to your heart through the blood vessel. Radio frequency waves destroy small areas of heart tissue where abnormal heartbeats may cause an arrhythmia to start. You are scheduled for Atrial Fibrillation Ablation on Friday, December 20 with Dr. Steffanie Dunn.Please arrive at the Main Entrance A at [facility]: [address] [place], [place] [address] at 8:30 AM   Follow-Up: At [facility], you and your health needs are our priority.  As part of our continuing mission to provide you with exceptional heart care, we have created designated Provider Care Teams.  These Care Teams include your primary Cardiologist (physician) and Advanced Practice Providers (APPs -  Physician Assistants and Nurse Practitioners) who all work together to provide you with the care you need, when you need it.  Tikosyn (Dofetilide) Hospital Admission   Prior to day of admission:  Check with drug  insurance company for cost of drug to ensure affordability --- Dofetilide 500 mcg twice a day.  GoodRx is an option if insurance copay is unaffordable.    No Benadryl is allowed 3 days prior to admission.   Please ensure no missed doses of your anticoagulation (blood thinner) for 3 weeks prior to admission. If a dose is missed please notify our office immediately.   A pharmacist will review all your medications for potential interactions with Tikosyn. If any medication changes are needed prior to admission we will be in touch with you.   If any new medications are started AFTER your admission date is set with Radio producer. Please notify our office immediately so your medication list can be updated and reviewed by our pharmacist again.  On day of admission:  Tikosyn initiation requires a 3 night/4 day hospital stay with constant telemetry monitoring. You will have an EKG after each dose of Tikosyn as well as daily lab draws.   If the drug does not convert you to normal rhythm a cardioversion after the 4th dose of Tikosyn.   Afib Clinic office visit on the morning of admission is needed for preliminary labs/ekg.   Time of admission is dependent on bed availability in the hospital. In some instances, you will be sent home until bed is available. Rarely admission can be delayed to the following day if hospital census prevents available beds.   You may bring personal belongings/clothing with you to the hospital. Please leave your suitcase in the car until you arrive in admissions.   Questions please call our office at [phone]

## 2023-05-16 ENCOUNTER — Telehealth: Payer: Self-pay | Admitting: Pharmacist

## 2023-05-16 NOTE — Telephone Encounter (Signed)
Medication list reviewed in anticipation of upcoming Tikosyn initiation. Patient is not taking any contraindicated or QTc prolonging medications.   Diltiazem can increase the concentration of Tikosyn and patient should be monitored for adverse effects from increased exposure (ie QTc).   Patient is anticoagulated on Xarelto on the appropriate dose. Please ensure that patient has not missed any anticoagulation doses in the 3 weeks prior to Tikosyn initiation.   Patient will need to be counseled to avoid use of Benadryl while on Tikosyn and in the 2-3 days prior to Tikosyn initiation.

## 2023-05-19 ENCOUNTER — Encounter: Payer: Self-pay | Admitting: Cardiology

## 2023-05-19 ENCOUNTER — Telehealth: Payer: Self-pay | Admitting: Cardiology

## 2023-05-19 NOTE — Telephone Encounter (Signed)
Patient states that he was returning Lynn's call. Please advise

## 2023-05-20 ENCOUNTER — Telehealth: Payer: Self-pay | Admitting: Cardiology

## 2023-05-20 DIAGNOSIS — Z0279 Encounter for issue of other medical certificate: Secondary | ICD-10-CM

## 2023-05-20 NOTE — Telephone Encounter (Signed)
FMLA form and payment received. Form in Dr. Lovena Neighbours box.

## 2023-05-21 ENCOUNTER — Telehealth (HOSPITAL_COMMUNITY): Payer: Self-pay

## 2023-05-21 NOTE — Telephone Encounter (Signed)
Initiated authorization to [employer].  Tikosyn admission for date of service 06/09/2023 is pending review.  Pending Authorization # [id number] Faxed clinical information to [employer].  Fax # [phone]

## 2023-05-26 NOTE — Telephone Encounter (Signed)
Authorization for Tikosyn admission has been approved. [id number]

## 2023-05-29 NOTE — Telephone Encounter (Signed)
Completed FMLA form scanned to patient's chart.  Billing notified. Original mailed to patient.

## 2023-05-29 NOTE — Telephone Encounter (Signed)
Form completed and placed up front

## 2023-06-06 ENCOUNTER — Encounter (HOSPITAL_COMMUNITY): Payer: Self-pay

## 2023-06-09 ENCOUNTER — Ambulatory Visit (HOSPITAL_COMMUNITY)
Admission: RE | Admit: 2023-06-09 | Discharge: 2023-06-09 | Disposition: A | Discharge status: Home / Self Care | Payer: BC Managed Care – PPO | Source: Ambulatory Visit | Attending: Physician Assistant | Admitting: Physician Assistant

## 2023-06-09 ENCOUNTER — Inpatient Hospital Stay (HOSPITAL_COMMUNITY)
Admission: AD | Admit: 2023-06-09 | Discharge: 2023-06-12 | DRG: 310 | Disposition: A | Discharge status: Home / Self Care | Payer: BC Managed Care – PPO | Source: Ambulatory Visit | Attending: Cardiology | Admitting: Cardiology

## 2023-06-09 ENCOUNTER — Other Ambulatory Visit (HOSPITAL_COMMUNITY): Payer: Self-pay

## 2023-06-09 ENCOUNTER — Encounter (HOSPITAL_COMMUNITY): Payer: Self-pay | Admitting: Cardiology

## 2023-06-09 ENCOUNTER — Encounter (HOSPITAL_COMMUNITY): Payer: Self-pay | Admitting: Physician Assistant

## 2023-06-09 VITALS — BP 122/84 | HR 116 | Ht 72.0 in | Wt 270.0 lb

## 2023-06-09 DIAGNOSIS — I4819 Other persistent atrial fibrillation: Secondary | ICD-10-CM | POA: Insufficient documentation

## 2023-06-09 DIAGNOSIS — I10 Essential (primary) hypertension: Secondary | ICD-10-CM | POA: Diagnosis present

## 2023-06-09 DIAGNOSIS — Z87891 Personal history of nicotine dependence: Secondary | ICD-10-CM | POA: Diagnosis not present

## 2023-06-09 DIAGNOSIS — Z6836 Body mass index (BMI) 36.0-36.9, adult: Secondary | ICD-10-CM

## 2023-06-09 DIAGNOSIS — Z79899 Other long term (current) drug therapy: Secondary | ICD-10-CM | POA: Insufficient documentation

## 2023-06-09 DIAGNOSIS — Z7901 Long term (current) use of anticoagulants: Secondary | ICD-10-CM | POA: Insufficient documentation

## 2023-06-09 DIAGNOSIS — Z85820 Personal history of malignant melanoma of skin: Secondary | ICD-10-CM

## 2023-06-09 DIAGNOSIS — E669 Obesity, unspecified: Secondary | ICD-10-CM | POA: Diagnosis not present

## 2023-06-09 DIAGNOSIS — K219 Gastro-esophageal reflux disease without esophagitis: Secondary | ICD-10-CM | POA: Diagnosis present

## 2023-06-09 LAB — BASIC METABOLIC PANEL
Anion gap: 10 (ref 5–15)
BUN: 20 mg/dL (ref 6–20)
CO2: 26 mmol/L (ref 22–32)
Calcium: 9 mg/dL (ref 8.9–10.3)
Chloride: 102 mmol/L (ref 98–111)
Creatinine, Ser: 1.43 mg/dL — ABNORMAL HIGH (ref 0.61–1.24)
GFR, Estimated: >60 mL/min (ref >60)
Glucose, Bld: 102 mg/dL — ABNORMAL HIGH (ref 70–99)
Potassium: 4.7 mmol/L (ref 3.5–5.1)
Sodium: 138 mmol/L (ref 135–145)

## 2023-06-09 LAB — MAGNESIUM: Magnesium: 2.3 mg/dL (ref 1.7–2.4)

## 2023-06-09 MED ORDER — PANTOPRAZOLE SODIUM 40 MG PO TBEC
40.0000 mg | DELAYED_RELEASE_TABLET | Freq: Every day | ORAL | Status: DC
  Administered 2023-06-10 – 2023-06-12 (×3): 40 mg via ORAL
  Filled 2023-06-09 (×3): qty 1

## 2023-06-09 MED ORDER — COLCHICINE 0.6 MG PO TABS: 0.6000 mg | ORAL_TABLET | ORAL | Status: DC | PRN

## 2023-06-09 MED ORDER — RIVAROXABAN 20 MG PO TABS
20.0000 mg | ORAL_TABLET | Freq: Every day | ORAL | Status: DC
  Administered 2023-06-09 – 2023-06-11 (×3): 20 mg via ORAL
  Filled 2023-06-09 (×3): qty 1

## 2023-06-09 MED ORDER — SODIUM CHLORIDE 0.9 % IV SOLN: 250.0000 mL | INTRAVENOUS | Status: DC | PRN

## 2023-06-09 MED ORDER — DILTIAZEM HCL 30 MG PO TABS
30.0000 mg | ORAL_TABLET | ORAL | Status: DC | PRN
  Filled 2023-06-09: qty 1

## 2023-06-09 MED ORDER — DOFETILIDE 500 MCG PO CAPS
500.0000 ug | ORAL_CAPSULE | Freq: Two times a day (BID) | ORAL | Status: DC
  Administered 2023-06-09 – 2023-06-12 (×6): 500 ug via ORAL
  Filled 2023-06-09 (×6): qty 1

## 2023-06-09 MED ORDER — ALLOPURINOL 300 MG PO TABS
300.0000 mg | ORAL_TABLET | Freq: Every day | ORAL | Status: DC
  Administered 2023-06-10 – 2023-06-12 (×3): 300 mg via ORAL
  Filled 2023-06-09 (×3): qty 1

## 2023-06-09 MED ORDER — SODIUM CHLORIDE 0.9% FLUSH: 3.0000 mL | INTRAVENOUS | Status: DC | PRN

## 2023-06-09 MED ORDER — DILTIAZEM HCL ER COATED BEADS 120 MG PO CP24
120.0000 mg | ORAL_CAPSULE | Freq: Every day | ORAL | Status: DC
  Administered 2023-06-10 – 2023-06-12 (×3): 120 mg via ORAL
  Filled 2023-06-09 (×3): qty 1

## 2023-06-09 MED ORDER — SODIUM CHLORIDE 0.9% FLUSH
3.0000 mL | Freq: Two times a day (BID) | INTRAVENOUS | Status: DC
  Administered 2023-06-09 – 2023-06-12 (×5): 3 mL via INTRAVENOUS

## 2023-06-09 NOTE — Progress Notes (Signed)
Pharmacy: Dofetilide (Tikosyn) - Initial Consult Assessment and Electrolyte Replacement  Pharmacy consulted to assist in monitoring and replacing electrolytes in this 52 y.o. male admitted on 06/09/2023 undergoing dofetilide initiation. First dofetilide dose: due at 8pm.  Assessment:  Patient Exclusion Criteria: If any screening criteria checked as "Yes", then  patient  should NOT receive dofetilide until criteria item is corrected.  If "Yes" please indicate correction plan.  YES  NO Patient  Exclusion Criteria Correction Plan   []   [x]   Baseline QTc interval is greater than or equal to 440 msec. IF above YES box checked dofetilide contraindicated unless patient has ICD; then may proceed if QTc 500-550 msec or with known ventricular conduction abnormalities may proceed with QTc 550-600 msec. QTc =  428 in AFib Clinic this am.    []   [x]   Patient is known or suspected to have a digoxin level greater than 2 ng/ml: No results found for: "DIGOXIN"     []   [x]   Creatinine clearance less than 20 ml/min (calculated using Cockcroft-Gault, actual body weight and serum creatinine): Estimated Creatinine Clearance: 84.7 mL/min (A) (by C-G formula based on SCr of 1.43 mg/dL (H)).     []   [x]  Patient has received drugs known to prolong the QT intervals within the last 48 hours (phenothiazines, tricyclics or tetracyclic antidepressants, erythromycin, H-1 antihistamines, cisapride, fluoroquinolones, azithromycin, ondansetron).   Updated information on QT prolonging agents is available to be searched on the following database:QT prolonging agents     []   [x]   Patient received a dose of hydrochlorothiazide (Oretic) alone or in any combination including triamterene (Dyazide, Maxzide) in the last 48 hours.    []   [x]  Patient received a medication known to increase dofetilide plasma concentrations prior to initial dofetilide dose:  Trimethoprim (Primsol, Proloprim) in the last 36 hours Verapamil  (Calan, Verelan) in the last 36 hours or a sustained release dose in the last 72 hours Megestrol (Megace) in the last 5 days  Cimetidine (Tagamet) in the last 6 hours Ketoconazole (Nizoral) in the last 24 hours Itraconazole (Sporanox) in the last 48 hours  Prochlorperazine (Compazine) in the last 36 hours     []   [x]   Patient is known to have a history of torsades de pointes; congenital or acquired long QT syndromes.    []   [x]   Patient has received a Class 1 antiarrhythmic with less than 2 half-lives since last dose. (Disopyramide, Quinidine, Procainamide, Lidocaine, Mexiletine, Flecainide, Propafenone)    []   [x]   Patient has received amiodarone therapy in the past 3 months or amiodarone level is greater than 0.3 ng/ml.    Labs:    Component Value Date/Time   K 4.7 06/09/2023 1114   MG 2.3 06/09/2023 1114     Plan: Select One Calculated CrCl  Dose q12h  [x]  > 60 ml/min 500 mcg  []  40-60 ml/min 250 mcg  []  20-40 ml/min 125 mcg   [x]   Physician selected initial dose within range recommended for patients level of renal function - will monitor for response.  []   Physician selected initial dose outside of range recommended for patients level of renal function - will discuss if the dose should be altered at this time.   Patient has been appropriately anticoagulated with Rivaroxaban 20 mg daily with supper.  Potassium: K >/= 4: Appropriate to initiate Tikosyn, no replacement needed    Magnesium: Mg >2: Appropriate to initiate Tikosyn, no replacement needed     Thank you for allowing pharmacy  to participate in this patient's care   Dennie Fetters, [place] 06/09/2023  3:24 PM

## 2023-06-09 NOTE — H&P (Signed)
Primary Care Physician: Kristian Covey, MD Primary Cardiologist: none Primary Electrophysiologist: Dr Lalla Brothers Referring Physician: Dr Rob Bunting is a 52 y.o. male with a history of HTN and atrial fibrillation who presents for follow up in the [facility], seen remotely by Rudi Coco and Dr Johney Frame. The patient underwent an afib ablation with Dr Johney Frame 04/03/2017. Patient has a CHADS2VASC score of 1.  Patient reports that he went back into afib on 03/27/23 with tachypalpitations and fatigue. He underwent DCCV on 04/25/23 which was unsuccessful. Seen by Dr Lalla Brothers who recommended dofetilide loading as a bridge to ablation.   On follow up today, patient presents for dofetilide loading. He remains in afib with symptoms of palpitations and fatigue with exertion. He denies any missed doses of anticoagulation in the past 3 weeks.   Today, he denies symptoms of chest pain, orthopnea, PND, lower extremity edema, presyncope, syncope, snoring, daytime somnolence, bleeding, or neurologic sequela. The patient is tolerating medications without difficulties and is otherwise without complaint today.    Atrial Fibrillation Risk Factors:  he does not have symptoms or diagnosis of sleep apnea. he does not have a history of rheumatic fever.   Atrial Fibrillation Management history:  Previous antiarrhythmic drugs: flecainide  Previous cardioversions: 04/10/17, 04/28/17, 04/25/23 Previous ablations: 04/03/17 Anticoagulation history: Xarelto    Past Medical History:  Diagnosis Date   Anxiety    when he was in A-fib, attributed to adrenal gland and fatigue   Arthritis    legs, ankles   GERD (gastroesophageal reflux disease)    Hyperaldosteronism (HCC)    Hypertension    Melanoma of lower back (HCC) 2000s   Migraine    none for years   Persistent atrial fibrillation (HCC)    Pneumonia    "a few times; last time ~ 2010" (04/03/2017)    Current  Facility-Administered Medications  Medication Dose Route Frequency Provider Last Rate Last Admin   0.9 %  sodium chloride infusion  250 mL Intravenous PRN Lanier Prude, MD       [START ON 06/10/2023] allopurinol (ZYLOPRIM) tablet 300 mg  300 mg Oral Daily Fenton, Clint R, PA       colchicine tablet 0.6 mg  0.6 mg Oral PRN Fenton, Clint R, PA       [START ON 06/10/2023] diltiazem (CARDIZEM CD) 24 hr capsule 120 mg  120 mg Oral Daily Fenton, Clint R, PA       diltiazem (CARDIZEM) tablet 30 mg  30 mg Oral Q4H PRN Fenton, Clint R, PA       dofetilide (TIKOSYN) capsule 500 mcg  500 mcg Oral BID Lanier Prude, MD       [START ON 06/10/2023] pantoprazole (PROTONIX) EC tablet 40 mg  40 mg Oral Daily Fenton, Clint R, PA       rivaroxaban (XARELTO) tablet 20 mg  20 mg Oral Q supper Steffanie Dunn T, MD       sodium chloride flush (NS) 0.9 % injection 3 mL  3 mL Intravenous Q12H Steffanie Dunn T, MD       sodium chloride flush (NS) 0.9 % injection 3 mL  3 mL Intravenous PRN Lanier Prude, MD        ROS- All systems are reviewed and negative except as per the HPI above.  Physical Exam: Vitals:    06/09/23 1032  BP: 122/84  Pulse: (!) 116  Weight: 122.5 kg  Height: 6' (1.829  m)      GEN: Well nourished, well developed in no acute distress NECK: No JVD; No carotid bruits CARDIAC: Irregularly irregular rate and rhythm, no murmurs, rubs, gallops RESPIRATORY:  Clear to auscultation without rales, wheezing or rhonchi  ABDOMEN: Soft, non-tender, non-distended EXTREMITIES:  No edema; No deformity    Wt Readings from Last 3 Encounters:  06/09/23 122.5 kg  05/15/23 126.8 kg  04/25/23 122.5 kg    EKG today demonstrates  Afib with RVR Vent. rate 116 BPM PR interval * ms QRS duration 88 ms QT/QTcB 322/447 ms  Echo 08/20/16 demonstrated  Left ventricle: The cavity size was normal. Wall thickness was    increased in a pattern of mild LVH. Systolic function was normal.    The  estimated ejection fraction was in the range of 50% to 55%.    Wall motion was normal; there were no regional wall motion    abnormalities. There was no evidence of elevated ventricular    filling pressure by Doppler parameters.  - Aorta: Ascending aortic diameter: 43 mm (S).  - Ascending aorta: The ascending aorta was mildly dilated.  - Left atrium: Volume/bsa, ES (1-plane Simpson&'s, A4C): 30.2    ml/m^2.  - Right ventricle: The cavity size was mildly dilated. Wall    thickness was normal.   Epic records are reviewed at length today  CHA2DS2-VASc Score = 1  The patient's score is based upon: CHF History: 0 HTN History: 1 Diabetes History: 0 Stroke History: 0 Vascular Disease History: 0 Age Score: 0 Gender Score: 0       ASSESSMENT AND PLAN: Persistent Atrial Fibrillation (ICD10:  I48.19) The patient's CHA2DS2-VASc score is 1, indicating a 0.6% annual risk of stroke.   S/p afib ablation 04/03/17 Scheduled for repeat ablation 10/17/23 Continue diltiazem 120 mg daily with 30 mg PRN q 4 hours for heart racing.  Patient presents for dofetilide admission. Continue Xarelto 20 mg daily, states no missed doses in the last 3 weeks. No recent benadryl use PharmD has screened medications QTc in SR 428 ms Labs today show creatinine at 1.43, K+ 4.7 and mag 2.3, CrCl calculated at 108 mL/min  HTN Stable on current regimen  Obesity There is no height or weight on file to calculate BMI.  Encouraged lifestyle modification  Pt presents for tikosyn admission as above as bridge to ablation.   Casimiro Needle [address] [place], PA-C  06/09/2023 3:02 PM

## 2023-06-09 NOTE — Plan of Care (Signed)
  Problem: Clinical Measurements: Goal: Ability to maintain clinical measurements within normal limits will improve Outcome: Progressing Goal: Diagnostic test results will improve Outcome: Progressing   Problem: Coping: Goal: Level of anxiety will decrease Outcome: Progressing   Problem: Health Behavior/Discharge Planning: Goal: Ability to manage health-related needs will improve Outcome: Completed/Met   Problem: Nutrition: Goal: Adequate nutrition will be maintained Outcome: Completed/Met   Problem: Pain Managment: Goal: General experience of comfort will improve Outcome: Completed/Met   Problem: Skin Integrity: Goal: Risk for impaired skin integrity will decrease Outcome: Completed/Met   Problem: Education: Goal: Knowledge of disease or condition will improve Outcome: Completed/Met Goal: Understanding of medication regimen will improve Outcome: Completed/Met   Problem: Health Behavior/Discharge Planning: Goal: Ability to safely manage health-related needs after discharge will improve Outcome: Completed/Met   Problem: Clinical Measurements: Goal: Respiratory complications will improve Outcome: Not Applicable   Problem: Activity: Goal: Risk for activity intolerance will decrease Outcome: Not Applicable

## 2023-06-09 NOTE — Progress Notes (Signed)
Primary Care Physician: Kristian Covey, MD Primary Cardiologist: none Primary Electrophysiologist: Dr Lalla Brothers Referring Physician: Dr Rob Bunting is a 52 y.o. male with a history of HTN and atrial fibrillation who presents for follow up in the [facility], seen remotely by Rudi Coco and Dr Johney Frame. The patient underwent an afib ablation with Dr Johney Frame 04/03/2017. Patient has a CHADS2VASC score of 1.  Patient reports that he went back into afib on 03/27/23 with tachypalpitations and fatigue. He underwent DCCV on 04/25/23 which was unsuccessful. Seen by Dr Lalla Brothers who recommended dofetilide loading as a bridge to ablation.   On follow up today, patient presents for dofetilide loading. He remains in afib with symptoms of palpitations and fatigue with exertion. He denies any missed doses of anticoagulation in the past 3 weeks.   Today, he denies symptoms of chest pain, orthopnea, PND, lower extremity edema, presyncope, syncope, snoring, daytime somnolence, bleeding, or neurologic sequela. The patient is tolerating medications without difficulties and is otherwise without complaint today.    Atrial Fibrillation Risk Factors:  he does not have symptoms or diagnosis of sleep apnea. he does not have a history of rheumatic fever.   Atrial Fibrillation Management history:  Previous antiarrhythmic drugs: flecainide  Previous cardioversions: 04/10/17, 04/28/17, 04/25/23 Previous ablations: 04/03/17 Anticoagulation history: Xarelto    Past Medical History:  Diagnosis Date   Anxiety    when he was in A-fib, attributed to adrenal gland and fatigue   Arthritis    legs, ankles   GERD (gastroesophageal reflux disease)    Hyperaldosteronism (HCC)    Hypertension    Melanoma of lower back (HCC) 2000s   Migraine    none for years   Persistent atrial fibrillation (HCC)    Pneumonia    "a few times; last time ~ 2010" (04/03/2017)    Current Outpatient  Medications  Medication Sig Dispense Refill   allopurinol (ZYLOPRIM) 300 MG tablet TAKE 1 TABLET BY MOUTH EVERY DAY 90 tablet 3   colchicine 0.6 MG tablet TAKE 1 TABLET (0.6 MG TOTAL) BY MOUTH 2 (TWO) TIMES DAILY. TO PREVENT GOUT (Patient taking differently: Take 0.6 mg by mouth as needed. To prevent gout) 180 tablet 0   diltiazem (CARDIZEM CD) 120 MG 24 hr capsule Take 1 capsule (120 mg total) by mouth daily. 30 capsule 3   diltiazem (CARDIZEM) 30 MG tablet Take 1 Tablet Every 4 Hours As Needed For HR >100 and TOP BP >100 45 tablet 1   esomeprazole (NEXIUM) 20 MG capsule Take 20 mg by mouth daily.     rivaroxaban (XARELTO) 20 MG TABS tablet Take 1 tablet (20 mg total) by mouth daily with supper. 30 tablet 3   No current facility-administered medications for this encounter.    ROS- All systems are reviewed and negative except as per the HPI above.  Physical Exam: Vitals:   06/09/23 1032  BP: 122/84  Pulse: (!) 116  Weight: 122.5 kg  Height: 6' (1.829 m)     GEN: Well nourished, well developed in no acute distress NECK: No JVD; No carotid bruits CARDIAC: Irregularly irregular rate and rhythm, no murmurs, rubs, gallops RESPIRATORY:  Clear to auscultation without rales, wheezing or rhonchi  ABDOMEN: Soft, non-tender, non-distended EXTREMITIES:  No edema; No deformity    Wt Readings from Last 3 Encounters:  06/09/23 122.5 kg  05/15/23 126.8 kg  04/25/23 122.5 kg    EKG today demonstrates  Afib with RVR  Vent. rate 116 BPM PR interval * ms QRS duration 88 ms QT/QTcB 322/447 ms  Echo 08/20/16 demonstrated  Left ventricle: The cavity size was normal. Wall thickness was    increased in a pattern of mild LVH. Systolic function was normal.    The estimated ejection fraction was in the range of 50% to 55%.    Wall motion was normal; there were no regional wall motion    abnormalities. There was no evidence of elevated ventricular    filling pressure by Doppler parameters.  -  Aorta: Ascending aortic diameter: 43 mm (S).  - Ascending aorta: The ascending aorta was mildly dilated.  - Left atrium: Volume/bsa, ES (1-plane Simpson&'s, A4C): 30.2    ml/m^2.  - Right ventricle: The cavity size was mildly dilated. Wall    thickness was normal.   Epic records are reviewed at length today  CHA2DS2-VASc Score = 1  The patient's score is based upon: CHF History: 0 HTN History: 1 Diabetes History: 0 Stroke History: 0 Vascular Disease History: 0 Age Score: 0 Gender Score: 0       ASSESSMENT AND PLAN: Persistent Atrial Fibrillation (ICD10:  I48.19) The patient's CHA2DS2-VASc score is 1, indicating a 0.6% annual risk of stroke.   S/p afib ablation 04/03/17 Scheduled for repeat ablation 10/17/23 Continue diltiazem 120 mg daily with 30 mg PRN q 4 hours for heart racing.  Patient presents for dofetilide admission. Continue Xarelto 20 mg daily, states no missed doses in the last 3 weeks. No recent benadryl use PharmD has screened medications QTc in SR 428 ms Labs today show creatinine at 1.43, K+ 4.7 and mag 2.3, CrCl calculated at 108 mL/min  HTN Stable on current regimen  Obesity Body mass index is 36.62 kg/m.  Encouraged lifestyle modification   To be admitted later today once a bed becomes available.    Jorja Loa PA-C Afib Clinic [facility] [address] [place], [place] [address] [phone] 06/09/2023 11:07 AM

## 2023-06-09 NOTE — TOC CM/SW Note (Signed)
Transition of Care [facility]) - Inpatient Brief Assessment   Patient Details  Name: Gregory Barrett MRN: [id number] Date of Birth: 1971/02/22  Transition of [facility]) CM/SW Contact:    Gala Lewandowsky, RN Phone Number: 06/09/2023, 3:47 PM   Clinical Narrative: Transition of Care Department [facility]) has reviewed the patient. Patient presented for Tikosyn Load. Benefits check submitted for cost. Case Manager will discuss cost and pharmacy of choice as the patient progresses.   Transition of Care Asessment: Insurance and Status: Insurance coverage has been reviewed Patient has primary care physician: Yes  Prior/Current Home Services: No current home services Social Determinants of Health Reivew: SDOH reviewed no interventions necessary Readmission risk has been reviewed: Yes Transition of care needs: transition of care needs identified, TOC will continue to follow

## 2023-06-09 NOTE — TOC Benefit Eligibility Note (Signed)
Pharmacy Patient [facility] verification completed.    The patient is insured through [facility]. Patient has [employer], may use a copay card, and/or apply for patient assistance if available.    Ran test claim for Tikosyn and the current 30 day co-pay is $267.72.   This test claim was processed through [facility]- copay amounts may vary at other pharmacies due to pharmacy/plan contracts, or as the patient moves through the different stages of their insurance plan.

## 2023-06-10 ENCOUNTER — Other Ambulatory Visit: Payer: Self-pay

## 2023-06-10 DIAGNOSIS — I4819 Other persistent atrial fibrillation: Secondary | ICD-10-CM | POA: Diagnosis not present

## 2023-06-10 LAB — BASIC METABOLIC PANEL
Anion gap: 12 (ref 5–15)
BUN: 17 mg/dL (ref 6–20)
CO2: 22 mmol/L (ref 22–32)
Calcium: 8.6 mg/dL — ABNORMAL LOW (ref 8.9–10.3)
Chloride: 103 mmol/L (ref 98–111)
Creatinine, Ser: 1.26 mg/dL — ABNORMAL HIGH (ref 0.61–1.24)
GFR, Estimated: >60 mL/min (ref >60)
Glucose, Bld: 103 mg/dL — ABNORMAL HIGH (ref 70–99)
Potassium: 3.7 mmol/L (ref 3.5–5.1)
Sodium: 137 mmol/L (ref 135–145)

## 2023-06-10 LAB — HIV ANTIBODY (ROUTINE TESTING W REFLEX): HIV Screen 4th Generation wRfx: NONREACTIVE

## 2023-06-10 MED ORDER — SODIUM CHLORIDE 0.9 % IV SOLN: INTRAVENOUS | Status: DC

## 2023-06-10 MED ORDER — POTASSIUM CHLORIDE CRYS ER 20 MEQ PO TBCR
60.0000 meq | EXTENDED_RELEASE_TABLET | Freq: Once | ORAL | Status: AC
  Administered 2023-06-10: 60 meq via ORAL
  Filled 2023-06-10: qty 3

## 2023-06-10 NOTE — H&P (View-Only) (Signed)
Morning EKG reviewed     Shows pt remains in afib with borderline QTc at ~470 ms ms, slightly shorter in some leads when measured manually. In others, QT is confounded by ? U wave.   Continue  Tikosyn 500 mcg BID.   Potassium3.7 (08/13 0705) Magnesium  2.0 (08/13 0053) Creatinine, ser  1.26* (08/13 0705)  Pt will be NPO after midnight for DCCV if remains in afib   Graciella Freer, PA-C  06/10/2023 11:19 AM

## 2023-06-10 NOTE — Progress Notes (Signed)
Morning EKG reviewed     Shows pt remains in afib with borderline QTc at ~470 ms ms, slightly shorter in some leads when measured manually. In others, QT is confounded by ? U wave.   Continue  Tikosyn 500 mcg BID.   Potassium3.7 (08/13 0705) Magnesium  2.0 (08/13 0053) Creatinine, ser  1.26* (08/13 0705)  Pt will be NPO after midnight for DCCV if remains in afib   Graciella Freer, PA-C  06/10/2023 11:19 AM

## 2023-06-10 NOTE — Progress Notes (Signed)
Post Tikosyn Qtc 413 by EKG. Pharmacy also notified of K 3.4 order placed to re-check BMP to verify K prior to supplementing.

## 2023-06-10 NOTE — Plan of Care (Signed)
  Problem: Education: Goal: Knowledge of General Education information will improve Description: Including pain rating scale, medication(s)/side effects and non-pharmacologic comfort measures Outcome: Progressing   Problem: Clinical Measurements: Goal: Ability to maintain clinical measurements within normal limits will improve Outcome: Progressing Goal: Will remain free from infection Outcome: Progressing Goal: Diagnostic test results will improve Outcome: Progressing Goal: Cardiovascular complication will be avoided Outcome: Progressing   Problem: Coping: Goal: Level of anxiety will decrease Outcome: Progressing   Problem: Elimination: Goal: Will not experience complications related to bowel motility Outcome: Progressing Goal: Will not experience complications related to urinary retention Outcome: Progressing   Problem: Safety: Goal: Ability to remain free from injury will improve Outcome: Progressing   Problem: Education: Goal: Individualized Educational Video(s) Outcome: Progressing   Problem: Activity: Goal: Ability to tolerate increased activity will improve Outcome: Progressing   Problem: Cardiac: Goal: Ability to achieve and maintain adequate cardiopulmonary perfusion will improve Outcome: Progressing

## 2023-06-10 NOTE — Consult Note (Signed)
Pharmacy: Dofetilide (Tikosyn) - Follow Up Assessment and Electrolyte Replacement  Pharmacy consulted to assist in monitoring and replacing electrolytes in this 52 y.o. male admitted on 06/09/2023 undergoing dofetilide initiation.   First dofetilide dose: 06/09/23 @2000   Labs:    Component Value Date/Time   K 3.7 06/10/2023 0705   MG 2.0 06/10/2023 0053     Plan: Potassium: K 3.5-3.7:  Give KCl 60 mEq po x1  - MD supplemented  Magnesium: Mg > 2: No additional supplementation needed  Trixie Rude, PharmD Clinical Pharmacist 06/10/2023  2:38 PM  Please check AMION for all [facility] Pharmacy phone numbers After 10:00 PM, call Main Pharmacy [phone]

## 2023-06-10 NOTE — Progress Notes (Signed)
   Electrophysiology Rounding Note  Patient Name: Gregory Barrett Date of Encounter: 06/10/2023  Primary Cardiologist: Hillis Range, MD (Inactive)  Electrophysiologist: Lanier Prude, MD    Subjective   Pt remains in afib on Tikosyn 500 mcg BID   QTc from EKG last pm shows stable QTc at ~410-420  The patient is doing well today.  At this time, the patient denies chest pain, shortness of breath, or any new concerns.  Inpatient Medications    Scheduled Meds:  allopurinol  300 mg Oral Daily   diltiazem  120 mg Oral Daily   dofetilide  500 mcg Oral BID   pantoprazole  40 mg Oral Daily   rivaroxaban  20 mg Oral Q supper   sodium chloride flush  3 mL Intravenous Q12H   Continuous Infusions:  sodium chloride     PRN Meds: sodium chloride, colchicine, diltiazem, sodium chloride flush   Vital Signs    Vitals:   06/09/23 1512 06/09/23 2014 06/10/23 0602  BP: 114/87 118/84 107/78  Pulse: 76 77 84  Resp: 18 16 19   Temp: 97.7 F (36.5 C) 97.9 F (36.6 C) 98.3 F (36.8 C)  TempSrc: Oral Oral Oral  SpO2: 98% 95% 97%  Weight: 123.1 kg    Height: 6' (1.829 m)     No intake or output data in the 24 hours ending 06/10/23 0832 Filed Weights   06/09/23 1512  Weight: 123.1 kg    Physical Exam    GEN- NAD, A&O x 3. Normal affect.  Lungs- CTAB, Normal effort.  Heart- Irregularly irregular rate and rhythm. No M/G/R GI- Soft, NT, ND Extremities- No clubbing, cyanosis, or edema Skin- no rash or lesion  Labs    CBC No results for input(s): "WBC", "NEUTROABS", "HGB", "HCT", "MCV", "PLT" in the last 72 hours. Basic Metabolic Panel Recent Labs    40/98/11 1114 06/10/23 0053 06/10/23 0705  NA 138 137 137  K 4.7 3.4* 3.7  CL 102 105 103  CO2 26 23 22   GLUCOSE 102* 123* 103*  BUN 20 17 17   CREATININE 1.43* 1.35* 1.26*  CALCIUM 9.0 8.3* 8.6*  MG 2.3 2.0  --     Telemetry    AF 60-70s (personally reviewed)  Patient Profile     Gregory Barrett is a 52 y.o. male  with a past medical history significant for persistent atrial fibrillation.  They were admitted for tikosyn load.   Assessment & Plan    Persistent atrial fibrillation Pt remains in afib on Tikosyn 500 mcg BID  Continue Xarelto Creatinine, ser  1.26* (08/13 0705) Magnesium  2.0 (08/13 0053) Potassium3.7 (08/13 0705) Supplement K  If pt does not convert chemically, plan on DCCV tomorrow    For questions or updates, please contact [facility] Please consult [email or web address] for contact info under Cardiology/STEMI.  Signed, Graciella Freer, PA-C  06/10/2023, 8:32 AM

## 2023-06-11 ENCOUNTER — Inpatient Hospital Stay (HOSPITAL_COMMUNITY): Payer: Self-pay | Admitting: Anesthesiology

## 2023-06-11 ENCOUNTER — Other Ambulatory Visit: Payer: Self-pay

## 2023-06-11 ENCOUNTER — Encounter (HOSPITAL_COMMUNITY)
Admission: AD | Disposition: A | Discharge status: Home / Self Care | Payer: Self-pay | Source: Ambulatory Visit | Attending: Cardiology

## 2023-06-11 ENCOUNTER — Encounter: Payer: Self-pay | Admitting: Student

## 2023-06-11 DIAGNOSIS — I4819 Other persistent atrial fibrillation: Secondary | ICD-10-CM | POA: Diagnosis not present

## 2023-06-11 HISTORY — PX: CARDIOVERSION: SHX1299

## 2023-06-11 SURGERY — CARDIOVERSION
Anesthesia: Monitor Anesthesia Care

## 2023-06-11 MED ORDER — HYDROCORTISONE 1 % EX CREA
TOPICAL_CREAM | Freq: Once | CUTANEOUS | Status: AC | PRN
  Filled 2023-06-11: qty 28

## 2023-06-11 MED ORDER — PROPOFOL 10 MG/ML IV BOLUS
INTRAVENOUS | Status: DC | PRN
Start: 2023-06-11 — End: 2023-06-11
  Administered 2023-06-11: 50 mg via INTRAVENOUS
  Administered 2023-06-11: 100 mg via INTRAVENOUS
  Administered 2023-06-11: 50 mg via INTRAVENOUS

## 2023-06-11 MED ORDER — DICLOFENAC SODIUM 1 % EX GEL
2.0000 g | Freq: Once | CUTANEOUS | Status: DC | PRN
  Filled 2023-06-11: qty 100

## 2023-06-11 MED ORDER — LIDOCAINE 2% (20 MG/ML) 5 ML SYRINGE
INTRAMUSCULAR | Status: DC | PRN
  Administered 2023-06-11: 100 mg via INTRAVENOUS

## 2023-06-11 SURGICAL SUPPLY — 1 items: ELECT DEFIB PAD ADLT CADENCE (PAD) ×1 IMPLANT

## 2023-06-11 NOTE — Interval H&P Note (Signed)
History and Physical Interval Note:  06/11/2023 7:43 AM  Gregory Barrett  has presented today for surgery, with the diagnosis of afib.  The various methods of treatment have been discussed with the patient and family. After consideration of risks, benefits and other options for treatment, the patient has consented to  Procedure(s): CARDIOVERSION (N/A) as a surgical intervention.  The patient's history has been reviewed, patient examined, no change in status, stable for surgery.  I have reviewed the patient's chart and labs.  Questions were answered to the patient's satisfaction.     Mossie Gilder

## 2023-06-11 NOTE — Consult Note (Signed)
Pharmacy: Dofetilide (Tikosyn) - Follow Up Assessment and Electrolyte Replacement  Pharmacy consulted to assist in monitoring and replacing electrolytes in this 52 y.o. male admitted on 06/09/2023 undergoing dofetilide initiation.   First dofetilide dose: 06/09/23 @2000   Labs:    Component Value Date/Time   K 4.0 06/11/2023 0042   MG 2.0 06/11/2023 0042     Plan: Potassium: K >/= 4: No additional supplementation needed   Magnesium: Mg > 2: No additional supplementation needed  Trixie Rude, PharmD Clinical Pharmacist 06/11/2023  10:47 AM  Please check AMION for all [facility] Pharmacy phone numbers After 10:00 PM, call Main Pharmacy [phone]

## 2023-06-11 NOTE — Anesthesia Preprocedure Evaluation (Addendum)
Anesthesia Evaluation  Patient identified by MRN, date of birth, ID band Patient awake    Reviewed: Allergy & Precautions, NPO status , Patient's Chart, lab work & pertinent test results, reviewed documented beta blocker date and time   History of Anesthesia Complications Negative for: history of anesthetic complications  Airway Mallampati: III  TM Distance: >3 FB Neck ROM: Full    Dental no notable dental hx.    Pulmonary pneumonia, neg COPD, Patient abstained from smoking., former smoker, neg PE   breath sounds clear to auscultation       Cardiovascular hypertension, (-) angina (-) CAD, (-) Past MI, (-) Cardiac Stents and (-) CABG + dysrhythmias Atrial Fibrillation (-) pacemaker Rhythm:Irregular Rate:Normal     Neuro/Psych  Headaches, neg Seizures  Anxiety        GI/Hepatic ,GERD  ,,(+) neg Cirrhosis        Endo/Other  neg diabetes    Renal/GU Renal disease     Musculoskeletal  (+) Arthritis ,    Abdominal   Peds  Hematology   Anesthesia Other Findings   Reproductive/Obstetrics                             Anesthesia Physical Anesthesia Plan  ASA: 2  Anesthesia Plan: General   Post-op Pain Management:    Induction: Intravenous  PONV Risk Score and Plan: 1 and Ondansetron and TIVA  Airway Management Planned: Mask  Additional Equipment:   Intra-op Plan:   Post-operative Plan:   Informed Consent:      Dental advisory given  Plan Discussed with: CRNA  Anesthesia Plan Comments:        Anesthesia Quick Evaluation

## 2023-06-11 NOTE — CV Procedure (Signed)
   Electrical Cardioversion Procedure Note Gregory Barrett [id number] [birth year]  Procedure: Electrical Cardioversion Indications:  Atrial Fibrillation  Time Out: Verified patient identification, verified procedure,medications/allergies/relevent history reviewed, required imaging and test results available.  Performed  Procedure Details  The patient signed informed consent.   The patient was NPO past midnight. Has had therapeutic anticoagulation with Xarelto greater than 3 weeks. The patient denies any interruption of anticoagulation.  Anesthesia was administered by Dr. Ace Barrett.  Adequate airway was maintained throughout and vital followed per protocol.  He was cardioverted x 2 with 200J , 250J and 360J of biphasic synchronized energy.  He converted to NSR at 360J.  There were no apparent complications.  The patient tolerated the procedure well and had normal neuro status and respiratory status post procedure with vitals stable as recorded elsewhere.     IMPRESSION:  Successful cardioversion of atrial fibrillation to sinus rhythm.   Follow up:  will be transferred to the medica floor.  Gregory Barrett 06/11/2023, 8:40 AM

## 2023-06-11 NOTE — Progress Notes (Signed)
Morning EKG reviewed     Shows is in NSR s/p DCC with stable QTc at ~450 ms.  Continue  Tikosyn 500 mcg BID.   Potassium4.0 (08/14 0042) Magnesium  2.0 (08/14 0042) Creatinine, ser  1.14 (08/14 0042)  Plan for home Thursday if QTc remains stable   Graciella Freer, [place]  06/11/2023 2:43 PM

## 2023-06-11 NOTE — Transfer of Care (Signed)
Immediate Anesthesia Transfer of Care Note  Patient: Gregory Barrett  Procedure(s) Performed: CARDIOVERSION  Patient Location: PACU  Anesthesia Type:MAC  Level of Consciousness: drowsy and patient cooperative  Airway & Oxygen Therapy: Patient connected to nasal cannula oxygen  Post-op Assessment: Report given to RN, Post -op Vital signs reviewed and stable, and Patient moving all extremities X 4  Post vital signs: Reviewed and stable  Last Vitals:  Vitals Value Taken Time  BP    Temp    Pulse 83 06/11/23 0820  Resp 16 06/11/23 0820  SpO2 96 % 06/11/23 0820  Vitals shown include unfiled device data.  Last Pain:  Vitals:   06/11/23 0746  TempSrc:   PainSc: 0-No pain         Complications: There were no known notable events for this encounter.

## 2023-06-11 NOTE — Progress Notes (Signed)
   Electrophysiology Rounding Note  Patient Name: Gregory Barrett Date of Encounter: 06/11/2023  Primary Cardiologist: Hillis Range, MD (Inactive)  Electrophysiologist: Lanier Prude, MD    Subjective   Pt remains in afib on Tikosyn 500 mcg BID   QTc from EKG last pm shows borderline QTc at 480 ms  The patient is doing well today.  At this time, the patient denies chest pain, shortness of breath, or any new concerns.  Inpatient Medications    Scheduled Meds:  allopurinol  300 mg Oral Daily   diltiazem  120 mg Oral Daily   dofetilide  500 mcg Oral BID   pantoprazole  40 mg Oral Daily   rivaroxaban  20 mg Oral Q supper   sodium chloride flush  3 mL Intravenous Q12H   Continuous Infusions:  sodium chloride     sodium chloride     PRN Meds: sodium chloride, colchicine, diltiazem, sodium chloride flush   Vital Signs    Vitals:   06/10/23 0602 06/10/23 1055 06/10/23 1932 06/11/23 0430  BP: 107/78 107/73 101/78 114/77  Pulse: 84 77 82 80  Resp: 19 15 16 19   Temp: 98.3 F (36.8 C) 97.8 F (36.6 C) 97.6 F (36.4 C) 98.6 F (37 C)  TempSrc: Oral Oral Oral Oral  SpO2: 97% 97% 97% 98%  Weight:      Height:       No intake or output data in the 24 hours ending 06/11/23 0724 Filed Weights   06/09/23 1512  Weight: 123.1 kg    Physical Exam    GEN- NAD, A&O x 3. Normal affect.  Lungs- CTAB, Normal effort.  Heart- Irregularly irregular rate and rhythm. No M/G/R GI- Soft, NT, ND Extremities- No clubbing, cyanosis, or edema Skin- no rash or lesion  Labs    CBC No results for input(s): "WBC", "NEUTROABS", "HGB", "HCT", "MCV", "PLT" in the last 72 hours. Basic Metabolic Panel Recent Labs    95/18/84 0053 06/10/23 0705 06/11/23 0042  NA 137 137 136  K 3.4* 3.7 4.0  CL 105 103 104  CO2 23 22 23   GLUCOSE 123* 103* 96  BUN 17 17 17   CREATININE 1.35* 1.26* 1.14  CALCIUM 8.3* 8.6* 8.7*  MG 2.0  --  2.0    Telemetry    AF 70-80s (personally  reviewed)  Patient Profile     Gregory Barrett is a 52 y.o. male with a past medical history significant for persistent atrial fibrillation.  They were admitted for tikosyn load.   Assessment & Plan    Persistent atrial fibrillation Pt remains in afib on Tikosyn 500 mcg BID  Continue Xarelto Creatinine, ser  1.14 (08/14 0042) Magnesium  2.0 (08/14 0042) Potassium4.0 (08/14 0042) No electrolyte supplementation needed Plan for ablation in December.   If pt does not convert chemically, plan on DCCV today   For questions or updates, please contact [facility] Please consult [email or web address] for contact info under Cardiology/STEMI.  Signed, Graciella Freer, PA-C  06/11/2023, 7:24 AM

## 2023-06-11 NOTE — Plan of Care (Signed)
  Problem: Education: Goal: Knowledge of General Education information will improve Description: Including pain rating scale, medication(s)/side effects and non-pharmacologic comfort measures Outcome: Progressing   Problem: Clinical Measurements: Goal: Ability to maintain clinical measurements within normal limits will improve Outcome: Progressing Goal: Will remain free from infection Outcome: Progressing Goal: Diagnostic test results will improve Outcome: Progressing Goal: Cardiovascular complication will be avoided Outcome: Progressing   Problem: Coping: Goal: Level of anxiety will decrease Outcome: Progressing   Problem: Elimination: Goal: Will not experience complications related to bowel motility Outcome: Progressing Goal: Will not experience complications related to urinary retention Outcome: Progressing   Problem: Safety: Goal: Ability to remain free from injury will improve Outcome: Progressing   Problem: Education: Goal: Individualized Educational Video(s) Outcome: Progressing   Problem: Activity: Goal: Ability to tolerate increased activity will improve Outcome: Progressing   Problem: Cardiac: Goal: Ability to achieve and maintain adequate cardiopulmonary perfusion will improve Outcome: Progressing

## 2023-06-11 NOTE — Anesthesia Postprocedure Evaluation (Signed)
Anesthesia Post Note  Patient: Gregory Barrett  Procedure(s) Performed: CARDIOVERSION     Patient location during evaluation: PACU Anesthesia Type: General Level of consciousness: awake and alert Pain management: pain level controlled Vital Signs Assessment: post-procedure vital signs reviewed and stable Respiratory status: spontaneous breathing, nonlabored ventilation, respiratory function stable and patient connected to nasal cannula oxygen Cardiovascular status: blood pressure returned to baseline and stable Postop Assessment: no apparent nausea or vomiting Anesthetic complications: no   There were no known notable events for this encounter.  Last Vitals:  Vitals:   06/11/23 0855 06/11/23 0908  BP: (!) 118/91 116/87  Pulse: 66 69  Resp: 12 16  Temp:    SpO2: 97% 99%    Last Pain:  Vitals:   06/11/23 0908  TempSrc:   PainSc: 0-No pain                 [name]

## 2023-06-11 NOTE — Care Management (Signed)
06-11-23 1310 Patient presented for Tikosyn Load. Case Manager spoke with the patient regarding co pay cost and patient is not agreeable to cost. Patient wants to use [facility]. Patient would like to have the initial Rx filled via [facility] and the Rx refills escribed to [facility] . 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 Message submitted to PA and Staff RN.  No further needs identified at this time.

## 2023-06-12 ENCOUNTER — Encounter (HOSPITAL_COMMUNITY): Payer: Self-pay | Admitting: Cardiology

## 2023-06-12 ENCOUNTER — Other Ambulatory Visit (HOSPITAL_COMMUNITY): Payer: Self-pay

## 2023-06-12 ENCOUNTER — Other Ambulatory Visit: Payer: Self-pay

## 2023-06-12 DIAGNOSIS — I4819 Other persistent atrial fibrillation: Secondary | ICD-10-CM | POA: Diagnosis not present

## 2023-06-12 LAB — BASIC METABOLIC PANEL
Anion gap: 10 (ref 5–15)
BUN: 18 mg/dL (ref 6–20)
CO2: 24 mmol/L (ref 22–32)
Calcium: 8.6 mg/dL — ABNORMAL LOW (ref 8.9–10.3)
Chloride: 102 mmol/L (ref 98–111)
Creatinine, Ser: 1.34 mg/dL — ABNORMAL HIGH (ref 0.61–1.24)
GFR, Estimated: >60 mL/min (ref >60)
Glucose, Bld: 105 mg/dL — ABNORMAL HIGH (ref 70–99)
Potassium: 4 mmol/L (ref 3.5–5.1)
Sodium: 136 mmol/L (ref 135–145)

## 2023-06-12 LAB — MAGNESIUM: Magnesium: 1.9 mg/dL (ref 1.7–2.4)

## 2023-06-12 MED ORDER — MAGNESIUM SULFATE 2 GM/50ML IV SOLN
2.0000 g | Freq: Once | INTRAVENOUS | Status: AC
  Administered 2023-06-12: 2 g via INTRAVENOUS
  Filled 2023-06-12: qty 50

## 2023-06-12 MED ORDER — DOFETILIDE 500 MCG PO CAPS
500.0000 ug | ORAL_CAPSULE | Freq: Two times a day (BID) | ORAL | 6 refills | Status: DC
Start: 2023-06-12 — End: 2023-07-22
  Filled 2023-06-12 – 2023-07-14 (×2): qty 60, 30d supply, fill #0

## 2023-06-12 NOTE — Progress Notes (Addendum)
EKG from yesterday evening 06/11/2023 reviewed     Shows remains in NSR with stable QTc at ~440-450 ms.  Continue  Tikosyn 500 mcg BID.   Labs pending this am.  Plan for home this afternoon if QTc remains stable.   Graciella Freer, PA-C  06/12/2023 6:58 AM

## 2023-06-12 NOTE — Discharge Summary (Signed)
ELECTROPHYSIOLOGY DISCHARGE SUMMARY    Patient ID: KHRYSTOPHER MCALPINE,  MRN: [id number], DOB/AGE: 1970-12-13 52 y.o.  Admit date: 06/09/2023 Discharge date: 06/12/2023  Primary Care Physician: Kristian Covey, MD  Primary Cardiologist: Hillis Range, MD (Inactive)  Electrophysiologist: Dr. Lalla Brothers   Primary Discharge Diagnosis:  1.  Persistent atrial fibrillation status post Tikosyn loading this admission  No Known Allergies   Procedures This Admission:  1.  Tikosyn loading  2.  Direct current cardioversion on Wednesday June 11, 2023 by Dr Servando Salina which successfully restored SR.  There were no early apparent complications.   Brief HPI: SANIL ROERIG is a 52 y.o. male with a past medical history as noted above.  They were referred to EP for treatment options of atrial fibrillation.  Risks, benefits, and alternatives to Tikosyn were reviewed with the patient who wished to proceed with admission for loading.  Hospital Course:  The patient was admitted and Tikosyn was initiated.  Renal function and electrolytes were followed during the hospitalization.  Their QTc remained stable. On 8/14 they underwent direct current cardioversion which restored sinus rhythm. The patients QTc remained stable. They were monitored on telemetry up to discharge. On the day of discharge, they were examined by Dr. Lalla Brothers  who considered them stable for discharge to home.  Follow-up has been arranged with the Atrial Fibrillation clinic in approximately 1 week.   Physical Exam: Vitals:   06/11/23 0908 06/11/23 1639 06/11/23 2022 06/12/23 0503  BP: 116/87 109/81 111/83 112/86  Pulse: 69 78 75 63  Resp: 16 15    Temp:  97.9 F (36.6 C) 98 F (36.7 C) 97.9 F (36.6 C)  TempSrc:  Oral Oral Oral  SpO2: 99%  97% 95%  Weight:      Height:        GEN- NAD, A&O x 3. Normal affect.  Lungs- CTAB, Normal effort.  Heart- Regular rate and rhythm. No M/G/R GI- Soft, NT, ND Extremities- No clubbing,  cyanosis, or edema Skin- no rash or lesion  Labs:   Lab Results  Component Value Date   WBC 7.0 04/04/2023   HGB 16.4 04/04/2023   HCT 48.0 04/04/2023   MCV 93.4 04/04/2023   PLT 256 04/04/2023    Recent Labs  Lab 06/12/23 0715  NA 136  K 4.0  CL 102  CO2 24  BUN 18  CREATININE 1.34*  CALCIUM 8.6*  GLUCOSE 105*    Discharge Medications:  Allergies as of 06/12/2023   No Known Allergies      Medication List     TAKE these medications    allopurinol 300 MG tablet Commonly known as: ZYLOPRIM TAKE 1 TABLET BY MOUTH EVERY DAY   colchicine 0.6 MG tablet TAKE 1 TABLET (0.6 MG TOTAL) BY MOUTH 2 (TWO) TIMES DAILY. TO PREVENT GOUT   diltiazem 120 MG 24 hr capsule Commonly known as: Cardizem CD Take 1 capsule (120 mg total) by mouth daily.   diltiazem 30 MG tablet Commonly known as: Cardizem Take 1 Tablet Every 4 Hours As Needed For HR >100 and TOP BP >100   dofetilide 500 MCG capsule Commonly known as: TIKOSYN Take 1 capsule (500 mcg total) by mouth 2 (two) times daily.   esomeprazole 20 MG capsule Commonly known as: NEXIUM Take 20 mg by mouth daily.   rivaroxaban 20 MG Tabs tablet Commonly known as: XARELTO Take 1 tablet (20 mg total) by mouth daily with supper.  Disposition:    Follow-up Information     [facility] Follow up.   Specialty: Cardiology Why: on 8/22 at 2 pm for post hospital tikosyn followup Contact information: [address] [place] [address] [phone]                Duration of Discharge Encounter: Greater than 30 minutes including physician time.  Dustin Flock, PA-C  06/12/2023 11:49 AM

## 2023-06-12 NOTE — Discharge Instructions (Signed)
 Dofetilide Capsules What is this medication? DOFETILIDE (doe FET il ide) treats a fast or irregular heartbeat (arrhythmia). It works by slowing down overactive electric signals in the heart, which stabilizes your heart rhythm. It belongs to a group of medications called antiarrhythmics. This medicine may be used for other purposes; ask your health care provider or pharmacist if you have questions. COMMON BRAND NAME(S): Tikosyn What should I tell my care team before I take this medication? They need to know if you have any of these conditions: Heart disease History of irregular heartbeat History of low levels of potassium or magnesium in the blood Kidney disease Liver disease An unusual or allergic reaction to dofetilide, other medications, foods, dyes, or preservatives Pregnant or trying to get pregnant Breast-feeding How should I use this medication? Take this medication by mouth with a glass of water. Follow the directions on the prescription label. Do not take with grapefruit juice. You can take it with or without food. If it upsets your stomach, take it with food. Take your medication at regular intervals. Do not take it more often than directed. Do not stop taking except on your care team's advice. A special MedGuide will be given to you by the pharmacist with each prescription and refill. Be sure to read this information carefully each time. Talk to your care team about the use of this medication in children. Special care may be needed. Overdosage: If you think you have taken too much of this medicine contact a poison control center or emergency room at once. NOTE: This medicine is only for you. Do not share this medicine with others. What if I miss a dose? If you miss a dose, skip it. Take your next dose at the normal time. Do not take extra or 2 doses at the same time to make up for the missed dose. What may interact with this medication? Do not take this medication with any of the  following: Benadryl (Diphenhydramine) Cimetidine Cisapride Dolutegravir Dronedarone Erdafitinib Hydrochlorothiazide Immodium Ketoconazole Megestrol Pimozide Prochlorperazine Thioridazine Trimethoprim Verapamil This medication may also interact with the following: Amiloride Cannabinoids Certain antibiotics like erythromycin or clarithromycin Certain antiviral medications for HIV or hepatitis Certain medications for depression, anxiety, or psychotic disorders Digoxin Diltiazem Grapefruit juice Metformin Nefazodone Other medications that prolong the QT interval (an abnormal heart rhythm) Quinine Triamterene Zafirlukast Ziprasidone This list may not describe all possible interactions. Give your health care provider a list of all the medicines, herbs, non-prescription drugs, or dietary supplements you use. Also tell them if you smoke, drink alcohol, or use illegal drugs. Some items may interact with your medicine. What should I watch for while using this medication? Your condition will be monitored carefully while you are receiving this medication. What side effects may I notice from receiving this medication? Side effects that you should report to your care team as soon as possible: Allergic reactions--skin rash, itching, hives, swelling of the face, lips, tongue, or throat Chest pain Heart rhythm changes--fast or irregular heartbeat, dizziness, feeling faint or lightheaded, chest pain, trouble breathing Side effects that usually do not require medical attention (report to your care team if they continue or are bothersome): Dizziness Headache Nausea Stomach pain Trouble sleeping This list may not describe all possible side effects. Call your doctor for medical advice about side effects. You may report side effects to FDA at 1-800-FDA-1088. Where should I keep my medication? Keep out of the reach of children. Store at room temperature between 15 and 30 degrees  C (59 and 86  degrees F). Throw away any unused medication after the expiration date. NOTE: This sheet is a summary. It may not cover all possible information. If you have questions about this medicine, talk to your doctor, pharmacist, or health care provider.  2024 Elsevier/Gold Standard (2021-09-14 00:00:00)

## 2023-06-12 NOTE — Progress Notes (Signed)
Pharmacy: Dofetilide (Tikosyn) - Follow Up Assessment and Electrolyte Replacement  Pharmacy consulted to assist in monitoring and replacing electrolytes in this 52 y.o. male admitted on 06/09/2023 undergoing dofetilide initiation. First dofetilide dose: 06/09/23 pm   Labs:    Component Value Date/Time   K 4.0 06/12/2023 0715   MG 1.9 06/12/2023 0715     Plan: Potassium: K >/= 4: No additional supplementation needed  Magnesium: Mg 1.8-2: Give Mg 2 gm IV x1   No potassium repletion recommended for discharge.   Thank you for allowing pharmacy to participate in this patient's care   Leander Rams 06/12/2023  11:56 AM

## 2023-06-15 ENCOUNTER — Encounter: Payer: Self-pay | Admitting: Cardiology

## 2023-06-18 ENCOUNTER — Telehealth: Payer: Self-pay

## 2023-06-18 NOTE — Transitions of Care (Post Inpatient/ED Visit) (Signed)
06/18/2023  Name: Gregory Barrett MRN: [id number] DOB: 12-30-1970  Today's TOC FU Call Status: Today's TOC FU Call Status:: Successful TOC FU Call Completed TOC FU Call Complete Date: 06/18/23  Transition Care Management Follow-up Telephone Call Date of Discharge: 06/12/23 Discharge Facility: [facility]) Type of Discharge: Inpatient Admission Primary Inpatient Discharge Diagnosis:: "atrial fibulation" How have you been since you were released from the hospital?: Better Any questions or concerns?: No  Items Reviewed: Did you receive and understand the discharge instructions provided?: Yes (Discharge instructions reviewed with patient) Medications obtained,verified, and reconciled?: Yes (Medications Reviewed) Any new allergies since your discharge?: No Dietary orders reviewed?: Yes Type of Diet Ordered:: Low NA, Heart Healthy Do you have support at home?: Yes People in Home: spouse  Medications Reviewed Today: Medications Reviewed Today     Reviewed by Amada Kingfisher, RN (Registered Nurse) on 06/18/23 at 1543  Med List Status: <None>   Medication Order Taking? Sig Documenting Provider Last Dose Status Informant  allopurinol (ZYLOPRIM) 300 MG tablet [id number] Yes TAKE 1 TABLET BY MOUTH EVERY DAY  Patient taking differently: Take 300 mg by mouth daily.   Rodolph Bong, MD Taking Active Self  colchicine 0.6 MG tablet [id number] Yes TAKE 1 TABLET (0.6 MG TOTAL) BY MOUTH 2 (TWO) TIMES DAILY. TO PREVENT GOUT Rodolph Bong, MD Taking Active Self  diltiazem (CARDIZEM CD) 120 MG 24 hr capsule [id number] Yes Take 1 capsule (120 mg total) by mouth daily. Fenton, [place] R, PA Taking Active Self  diltiazem (CARDIZEM) 30 MG tablet [id number] Yes Take 1 Tablet Every 4 Hours As Needed For HR >100 and TOP BP >100 Fenton, Clint R, PA Taking Active Self           Med Note (SATTERFIELD, Lenon Curt E   Mon Jun 09, 2023  8:45 PM) Still has on hand just in case  dofetilide (TIKOSYN) 500 MCG capsule  [id number] Yes Take 1 capsule (500 mcg total) by mouth 2 (two) times daily. Graciella Freer, PA-C Taking Active   esomeprazole (NEXIUM) 20 MG capsule [id number] Yes Take 20 mg by mouth daily. [provider] Taking Active Self  rivaroxaban (XARELTO) 20 MG TABS tablet [id number] Yes Take 1 tablet (20 mg total) by mouth daily with supper. Danice Goltz, PA Taking Active Self           Med Note (SATTERFIELD, Marquis Buggy Jun 09, 2023  8:44 PM)              Home Care and Equipment/Supplies: Were Home Health Services Ordered?: NA Any new equipment or medical supplies ordered?: NA  Functional Questionnaire: Do you need assistance with bathing/showering or dressing?: No Do you need assistance with meal preparation?: No Do you need assistance with eating?: No Do you have difficulty maintaining continence: No Do you need assistance with getting out of bed/getting out of a chair/moving?: No Do you have difficulty managing or taking your medications?: No  Follow up appointments reviewed: PCP Follow-up appointment confirmed?: NA Specialist Hospital Follow-up appointment confirmed?: Yes Date of Specialist follow-up appointment?: 06/19/23 ([facility]RI[facility] with Justin Mend Thursday Aug) Follow-Up Specialty Provider:: [facility] Do you need transportation to your follow-up appointment?: No Do you understand care options if your condition(s) worsen?: Yes-patient verbalized understanding  Post hospital discharge instructions reviewed.  No SDOH identified at this time.  Discussed Transition of Care 30 day program, opted out.  Review of monitoring and managing symptoms of AFIB.  Discussed medications including education on Tikosyn. Patient verbalized understanding and had no additional questions or needs at this time.  He will follow-up as directed with [facility] on 06/18/23.  He is aware to call with any questions or concerns.     Harm Jou J. Cristela Felt, RN, BSN,  MSN Care Management Coordinator/[place] Phone Number:  [phone]

## 2023-06-19 ENCOUNTER — Ambulatory Visit (HOSPITAL_COMMUNITY)
Admit: 2023-06-19 | Discharge: 2023-06-19 | Disposition: A | Discharge status: Home / Self Care | Payer: BC Managed Care – PPO | Attending: Internal Medicine | Admitting: Internal Medicine

## 2023-06-19 VITALS — BP 110/86 | HR 92 | Ht 72.0 in | Wt 276.0 lb

## 2023-06-19 DIAGNOSIS — Z5181 Encounter for therapeutic drug level monitoring: Secondary | ICD-10-CM | POA: Diagnosis not present

## 2023-06-19 DIAGNOSIS — E669 Obesity, unspecified: Secondary | ICD-10-CM | POA: Insufficient documentation

## 2023-06-19 DIAGNOSIS — I4819 Other persistent atrial fibrillation: Secondary | ICD-10-CM | POA: Diagnosis not present

## 2023-06-19 DIAGNOSIS — Z79899 Other long term (current) drug therapy: Secondary | ICD-10-CM | POA: Diagnosis not present

## 2023-06-19 DIAGNOSIS — I10 Essential (primary) hypertension: Secondary | ICD-10-CM | POA: Insufficient documentation

## 2023-06-19 DIAGNOSIS — I491 Atrial premature depolarization: Secondary | ICD-10-CM

## 2023-06-19 DIAGNOSIS — Z7901 Long term (current) use of anticoagulants: Secondary | ICD-10-CM | POA: Diagnosis not present

## 2023-06-19 DIAGNOSIS — Z6837 Body mass index (BMI) 37.0-37.9, adult: Secondary | ICD-10-CM | POA: Insufficient documentation

## 2023-06-19 LAB — BASIC METABOLIC PANEL
Anion gap: 8 (ref 5–15)
BUN: 24 mg/dL — ABNORMAL HIGH (ref 6–20)
CO2: 26 mmol/L (ref 22–32)
Calcium: 9.2 mg/dL (ref 8.9–10.3)
Chloride: 106 mmol/L (ref 98–111)
Creatinine, Ser: 1.11 mg/dL (ref 0.61–1.24)
GFR, Estimated: >60 mL/min (ref >60)
Glucose, Bld: 88 mg/dL (ref 70–99)
Potassium: 4.1 mmol/L (ref 3.5–5.1)
Sodium: 140 mmol/L (ref 135–145)

## 2023-06-19 LAB — MAGNESIUM: Magnesium: 2.2 mg/dL (ref 1.7–2.4)

## 2023-06-19 NOTE — Progress Notes (Addendum)
Primary Care Physician: No primary care provider on file. Primary Cardiologist: none Primary Electrophysiologist: Dr Lalla Brothers Referring Physician: Dr Johney Frame   Gregory Barrett is a 52 y.o. male with a history of HTN and atrial fibrillation who presents for follow up in the [facility], seen remotely by Rudi Coco and Dr Johney Frame. The patient underwent an afib ablation with Dr Johney Frame 04/03/2017. Patient has a CHADS2VASC score of 1.  Patient reports that he went back into afib on 03/27/23 with tachypalpitations and fatigue. He underwent DCCV on 04/25/23 which was unsuccessful. Seen by Dr Lalla Brothers who recommended dofetilide loading as a bridge to ablation.   On follow up today, patient presents for dofetilide loading. He remains in afib with symptoms of palpitations and fatigue with exertion. He denies any missed doses of anticoagulation in the past 3 weeks.   On follow up 06/19/23, he is currently in NSR. S/p Tikosyn admission 8/12-15/24 with successful DCCV on 8/14. Patient contacted office on 8/18 noting he went back into Afib. He notes to be paroxysmal; will feel tired and SOB when in Afib. He actually states he feels well overall because it's better than remaining in Afib. He can go out of rhythm a couple of times daily. No bleeding issues on Xarelto.   Today, he denies symptoms of chest pain, orthopnea, PND, lower extremity edema, presyncope, syncope, snoring, daytime somnolence, bleeding, or neurologic sequela. The patient is tolerating medications without difficulties and is otherwise without complaint today.    Atrial Fibrillation Risk Factors:  he does not have symptoms or diagnosis of sleep apnea. he does not have a history of rheumatic fever.   Atrial Fibrillation Management history:  Previous antiarrhythmic drugs: flecainide  Previous cardioversions: 04/10/17, 04/28/17, 04/25/23, 06/11/23 Previous ablations: 04/03/17 Anticoagulation history: Xarelto    Past  Medical History:  Diagnosis Date   Anxiety    when he was in A-fib, attributed to adrenal gland and fatigue   Arthritis    legs, ankles   GERD (gastroesophageal reflux disease)    Hyperaldosteronism (HCC)    Hypertension    Melanoma of lower back (HCC) 2000s   Migraine    none for years   Persistent atrial fibrillation (HCC)    Pneumonia    "a few times; last time ~ 2010" (04/03/2017)    Current Outpatient Medications  Medication Sig Dispense Refill   allopurinol (ZYLOPRIM) 300 MG tablet TAKE 1 TABLET BY MOUTH EVERY DAY (Patient taking differently: Take 300 mg by mouth daily.) 90 tablet 3   colchicine 0.6 MG tablet TAKE 1 TABLET (0.6 MG TOTAL) BY MOUTH 2 (TWO) TIMES DAILY. TO PREVENT GOUT (Patient taking differently: Take 0.6 mg by mouth as needed. To prevent gout) 180 tablet 0   diltiazem (CARDIZEM CD) 120 MG 24 hr capsule Take 1 capsule (120 mg total) by mouth daily. 30 capsule 3   diltiazem (CARDIZEM) 30 MG tablet Take 1 Tablet Every 4 Hours As Needed For HR >100 and TOP BP >100 45 tablet 1   dofetilide (TIKOSYN) 500 MCG capsule Take 1 capsule (500 mcg total) by mouth 2 (two) times daily. 60 capsule 6   esomeprazole (NEXIUM) 20 MG capsule Take 20 mg by mouth daily.     rivaroxaban (XARELTO) 20 MG TABS tablet Take 1 tablet (20 mg total) by mouth daily with supper. 30 tablet 3   No current facility-administered medications for this encounter.    ROS- All systems are reviewed and negative except as per  the HPI above.  Physical Exam: Vitals:   06/19/23 1341  BP: 110/86  Pulse: 92  Weight: 125.2 kg  Height: 6' (1.829 m)    GEN- The patient is well appearing, alert and oriented x 3 today.   Neck - no JVD or carotid bruit noted Lungs- Clear to ausculation bilaterally, normal work of breathing Heart- Regular rate and rhythm, no murmurs, rubs or gallops, PMI not laterally displaced Extremities- no clubbing, cyanosis, or edema Skin - no rash or ecchymosis noted   Wt Readings  from Last 3 Encounters:  06/19/23 125.2 kg  06/09/23 123.1 kg  06/09/23 122.5 kg    EKG today demonstrates  Vent. rate 92 BPM PR interval 178 ms QRS duration 90 ms QT/QTcB 388/479 ms P-R-T axes 14 29 12  Sinus rhythm with Premature atrial complexes with Abberant conduction Septal infarct , age undetermined Abnormal ECG When compared with ECG of 12-Jun-2023 11:03, PREVIOUS ECG IS PRESENT  Echo 08/20/16 demonstrated  Left ventricle: The cavity size was normal. Wall thickness was    increased in a pattern of mild LVH. Systolic function was normal.    The estimated ejection fraction was in the range of 50% to 55%.    Wall motion was normal; there were no regional wall motion    abnormalities. There was no evidence of elevated ventricular    filling pressure by Doppler parameters.  - Aorta: Ascending aortic diameter: 43 mm (S).  - Ascending aorta: The ascending aorta was mildly dilated.  - Left atrium: Volume/bsa, ES (1-plane Simpson&'s, A4C): 30.2    ml/m^2.  - Right ventricle: The cavity size was mildly dilated. Wall    thickness was normal.   Epic records are reviewed at length today  CHA2DS2-VASc Score = 1  The patient's score is based upon: CHF History: 0 HTN History: 1 Diabetes History: 0 Stroke History: 0 Vascular Disease History: 0 Age Score: 0 Gender Score: 0       ASSESSMENT AND PLAN: Persistent Atrial Fibrillation (ICD10:  I48.19) The patient's CHA2DS2-VASc score is 1, indicating a 0.6% annual risk of stroke.   S/p afib ablation 04/03/17 Scheduled for repeat ablation 10/17/23 S/p Tikosyn admission 8/12-15/24 with successful DCCV on 8/14.  He is currently in NSR. Will continue observation for now and hope as he continues to load on Tikosyn he will remain in sinus rhythm.   Qtc stable 446 ms. Continue Tikosyn 500 mcg BID. Continue diltiazem 120 mg daily. Continue Xarelto 20 mg daily. Bmet and mag drawn today.  HTN Stable on current  regimen  Obesity Body mass index is 37.43 kg/m.  Encouraged lifestyle modification   F/u 1 month for Tikosyn surveillance with Ricky.    Lake Bells, PA-C Afib Clinic [facility] [address] [place], [place] [address] [phone] 06/19/2023 2:49 PM

## 2023-07-13 ENCOUNTER — Encounter: Payer: Self-pay | Admitting: Cardiology

## 2023-07-14 ENCOUNTER — Other Ambulatory Visit (HOSPITAL_COMMUNITY): Payer: Self-pay | Admitting: *Deleted

## 2023-07-14 ENCOUNTER — Other Ambulatory Visit (HOSPITAL_COMMUNITY): Payer: Self-pay

## 2023-07-14 MED ORDER — DOFETILIDE 500 MCG PO CAPS: 500.0000 ug | ORAL_CAPSULE | Freq: Two times a day (BID) | ORAL | 6 refills | Status: DC

## 2023-07-21 ENCOUNTER — Encounter (HOSPITAL_COMMUNITY): Payer: Self-pay | Admitting: Physician Assistant

## 2023-07-21 ENCOUNTER — Ambulatory Visit (HOSPITAL_COMMUNITY)
Admission: RE | Admit: 2023-07-21 | Discharge: 2023-07-21 | Disposition: A | Discharge status: Home / Self Care | Payer: BC Managed Care – PPO | Source: Ambulatory Visit | Attending: Physician Assistant | Admitting: Physician Assistant

## 2023-07-21 VITALS — BP 136/100 | HR 68 | Ht 72.0 in | Wt 272.0 lb

## 2023-07-21 DIAGNOSIS — I10 Essential (primary) hypertension: Secondary | ICD-10-CM | POA: Diagnosis not present

## 2023-07-21 DIAGNOSIS — E669 Obesity, unspecified: Secondary | ICD-10-CM | POA: Diagnosis not present

## 2023-07-21 DIAGNOSIS — Z5181 Encounter for therapeutic drug level monitoring: Secondary | ICD-10-CM

## 2023-07-21 DIAGNOSIS — I4819 Other persistent atrial fibrillation: Secondary | ICD-10-CM | POA: Insufficient documentation

## 2023-07-21 DIAGNOSIS — Z6836 Body mass index (BMI) 36.0-36.9, adult: Secondary | ICD-10-CM | POA: Diagnosis not present

## 2023-07-21 DIAGNOSIS — Z79899 Other long term (current) drug therapy: Secondary | ICD-10-CM | POA: Insufficient documentation

## 2023-07-21 DIAGNOSIS — Z7901 Long term (current) use of anticoagulants: Secondary | ICD-10-CM | POA: Insufficient documentation

## 2023-07-21 LAB — BASIC METABOLIC PANEL
Anion gap: 9 (ref 5–15)
BUN: 21 mg/dL — ABNORMAL HIGH (ref 6–20)
CO2: 22 mmol/L (ref 22–32)
Calcium: 8.7 mg/dL — ABNORMAL LOW (ref 8.9–10.3)
Chloride: 107 mmol/L (ref 98–111)
Creatinine, Ser: 1.16 mg/dL (ref 0.61–1.24)
GFR, Estimated: >60 mL/min (ref >60)
Glucose, Bld: 81 mg/dL (ref 70–99)
Potassium: 3.7 mmol/L (ref 3.5–5.1)
Sodium: 138 mmol/L (ref 135–145)

## 2023-07-21 LAB — MAGNESIUM: Magnesium: 2.3 mg/dL (ref 1.7–2.4)

## 2023-07-21 NOTE — Progress Notes (Signed)
Primary Care Physician: No primary care provider on file. Primary Cardiologist: none Primary Electrophysiologist: Dr Lalla Brothers Referring Physician: Dr Johney Frame   Gregory Barrett is a 52 y.o. male with a history of HTN and atrial fibrillation who presents for follow up in the [facility], seen remotely by Rudi Coco and Dr Johney Frame. The patient underwent an afib ablation with Dr Johney Frame 04/03/2017. Patient has a CHADS2VASC score of 1.  Patient reports that he went back into afib on 03/27/23 with tachypalpitations and fatigue. He underwent DCCV on 04/25/23 which was unsuccessful. Seen by Dr Lalla Brothers who recommended dofetilide loading as a bridge to ablation.   S/p Tikosyn admission 8/12-15/24 with successful DCCV on 8/14.   On follow up today, patient reports that he has done well since his last visit. He does still have some afib symptoms when stressed but the symptoms resolve quickly without intervention. He is scheduled for afib ablation on 10/17/23.  Today, he denies symptoms of chest pain, orthopnea, PND, lower extremity edema, presyncope, syncope, snoring, daytime somnolence, bleeding, or neurologic sequela. The patient is tolerating medications without difficulties and is otherwise without complaint today.    Atrial Fibrillation Risk Factors:  he does not have symptoms or diagnosis of sleep apnea. he does not have a history of rheumatic fever.   Atrial Fibrillation Management history:  Previous antiarrhythmic drugs: flecainide, dofetilide  Previous cardioversions: 04/10/17, 04/28/17, 04/25/23, 06/11/23 Previous ablations: 04/03/17 Anticoagulation history: Xarelto    Past Medical History:  Diagnosis Date   Anxiety    when he was in A-fib, attributed to adrenal gland and fatigue   Arthritis    legs, ankles   GERD (gastroesophageal reflux disease)    Hyperaldosteronism (HCC)    Hypertension    Melanoma of lower back (HCC) 2000s   Migraine    none for years    Persistent atrial fibrillation (HCC)    Pneumonia    "a few times; last time ~ 2010" (04/03/2017)    Current Outpatient Medications  Medication Sig Dispense Refill   allopurinol (ZYLOPRIM) 300 MG tablet TAKE 1 TABLET BY MOUTH EVERY DAY (Patient taking differently: Take 300 mg by mouth daily.) 90 tablet 3   colchicine 0.6 MG tablet TAKE 1 TABLET (0.6 MG TOTAL) BY MOUTH 2 (TWO) TIMES DAILY. TO PREVENT GOUT (Patient taking differently: Take 0.6 mg by mouth as needed. To prevent gout) 180 tablet 0   diltiazem (CARDIZEM CD) 120 MG 24 hr capsule Take 1 capsule (120 mg total) by mouth daily. 30 capsule 3   diltiazem (CARDIZEM) 30 MG tablet Take 1 Tablet Every 4 Hours As Needed For HR >100 and TOP BP >100 45 tablet 1   dofetilide (TIKOSYN) 500 MCG capsule Take 1 capsule (500 mcg total) by mouth 2 (two) times daily. 60 capsule 6   dofetilide (TIKOSYN) 500 MCG capsule Take 1 capsule (500 mcg total) by mouth 2 (two) times daily. 60 capsule 6   esomeprazole (NEXIUM) 20 MG capsule Take 20 mg by mouth daily.     rivaroxaban (XARELTO) 20 MG TABS tablet Take 1 tablet (20 mg total) by mouth daily with supper. 30 tablet 3   No current facility-administered medications for this encounter.    ROS- All systems are reviewed and negative except as per the HPI above.  Physical Exam: Vitals:   07/21/23 1458  BP: (!) 136/100  Pulse: 68  Weight: 123.4 kg  Height: 6' (1.829 m)    GEN: Well nourished, well developed  in no acute distress NECK: No JVD; No carotid bruits CARDIAC: Regular rate and rhythm, no murmurs, rubs, gallops RESPIRATORY:  Clear to auscultation without rales, wheezing or rhonchi  ABDOMEN: Soft, non-tender, non-distended EXTREMITIES:  No edema; No deformity    Wt Readings from Last 3 Encounters:  07/21/23 123.4 kg  06/19/23 125.2 kg  06/09/23 123.1 kg    EKG today demonstrates  SR Vent. rate 68 BPM PR interval 198 ms QRS duration 94 ms QT/QTcB 444/472 ms  Echo 08/20/16  demonstrated  Left ventricle: The cavity size was normal. Wall thickness was    increased in a pattern of mild LVH. Systolic function was normal.    The estimated ejection fraction was in the range of 50% to 55%.    Wall motion was normal; there were no regional wall motion    abnormalities. There was no evidence of elevated ventricular    filling pressure by Doppler parameters.  - Aorta: Ascending aortic diameter: 43 mm (S).  - Ascending aorta: The ascending aorta was mildly dilated.  - Left atrium: Volume/bsa, ES (1-plane Simpson&'s, A4C): 30.2    ml/m^2.  - Right ventricle: The cavity size was mildly dilated. Wall    thickness was normal.   Epic records are reviewed at length today  CHA2DS2-VASc Score = 1  The patient's score is based upon: CHF History: 0 HTN History: 1 Diabetes History: 0 Stroke History: 0 Vascular Disease History: 0 Age Score: 0 Gender Score: 0       ASSESSMENT AND PLAN: Persistent Atrial Fibrillation (ICD10:  I48.19) The patient's CHA2DS2-VASc score is 1, indicating a 0.6% annual risk of stroke.   S/p afib ablation 04/03/17 Scheduled for repeat ablation 10/17/23 S/p Tikosyn admission 8/12-15/24 with successful DCCV on 8/14. Continue Tikosyn 500 mcg BID, QT stable Check bmet/mag today Continue diltiazem 120 mg daily with 30 mg PRN q 4 hours for heart racing Continue Xarelto 20 mg daily  HTN Stable on current regimen  Obesity Body mass index is 36.89 kg/m.  Encouraged lifestyle modification   Follow up for afib ablation as scheduled.    Jorja Loa PA-C Afib Clinic [facility] [address] [place], [place] [address] [phone] 07/21/2023 3:51 PM

## 2023-07-22 ENCOUNTER — Other Ambulatory Visit (HOSPITAL_COMMUNITY): Payer: Self-pay | Admitting: *Deleted

## 2023-07-22 ENCOUNTER — Encounter (HOSPITAL_COMMUNITY): Payer: Self-pay

## 2023-07-22 MED ORDER — POTASSIUM CHLORIDE ER 10 MEQ PO TBCR: 10.0000 meq | EXTENDED_RELEASE_TABLET | Freq: Every day | ORAL | 3 refills | Status: DC

## 2023-07-26 ENCOUNTER — Other Ambulatory Visit (HOSPITAL_COMMUNITY): Payer: Self-pay | Admitting: Physician Assistant

## 2023-07-29 ENCOUNTER — Other Ambulatory Visit: Payer: Self-pay | Admitting: Family Medicine

## 2023-07-29 NOTE — Telephone Encounter (Signed)
Pt has not been seen since 2021. Denied rx refill and sent pt a MyChart message requesting he call to schedule a visit.

## 2023-07-31 ENCOUNTER — Telehealth: Payer: Self-pay

## 2023-07-31 NOTE — Telephone Encounter (Signed)
VOB initiated for RIGHT knee OA.

## 2023-07-31 NOTE — Telephone Encounter (Signed)
-----   Message from Adron Bene sent at 07/31/2023 12:54 PM EDT ----- Regarding: Berkley Harvey Please auth visco R knee.

## 2023-08-04 ENCOUNTER — Other Ambulatory Visit (HOSPITAL_COMMUNITY): Payer: Self-pay | Admitting: *Deleted

## 2023-08-04 MED ORDER — RIVAROXABAN 20 MG PO TABS: 20.0000 mg | ORAL_TABLET | Freq: Every day | ORAL | 6 refills | Status: DC

## 2023-08-05 NOTE — Telephone Encounter (Addendum)
Prior Auth REQUIRED for Orthovisc

## 2023-08-06 ENCOUNTER — Other Ambulatory Visit (HOSPITAL_COMMUNITY): Payer: BC Managed Care – PPO | Admitting: Physician Assistant

## 2023-08-06 ENCOUNTER — Encounter (HOSPITAL_COMMUNITY): Payer: Self-pay

## 2023-08-07 ENCOUNTER — Ambulatory Visit (HOSPITAL_COMMUNITY)
Admission: RE | Admit: 2023-08-07 | Discharge: 2023-08-07 | Disposition: A | Discharge status: Home / Self Care | Payer: BC Managed Care – PPO | Source: Ambulatory Visit | Attending: Physician Assistant | Admitting: Physician Assistant

## 2023-08-07 DIAGNOSIS — I4819 Other persistent atrial fibrillation: Secondary | ICD-10-CM | POA: Insufficient documentation

## 2023-08-07 DIAGNOSIS — Z5181 Encounter for therapeutic drug level monitoring: Secondary | ICD-10-CM | POA: Insufficient documentation

## 2023-08-07 DIAGNOSIS — Z79899 Other long term (current) drug therapy: Secondary | ICD-10-CM | POA: Insufficient documentation

## 2023-08-07 LAB — BASIC METABOLIC PANEL
Anion gap: 11 (ref 5–15)
BUN: 19 mg/dL (ref 6–20)
CO2: 22 mmol/L (ref 22–32)
Calcium: 9.1 mg/dL (ref 8.9–10.3)
Chloride: 105 mmol/L (ref 98–111)
Creatinine, Ser: 1.4 mg/dL — ABNORMAL HIGH (ref 0.61–1.24)
GFR, Estimated: >60 mL/min (ref >60)
Glucose, Bld: 90 mg/dL (ref 70–99)
Potassium: 3.8 mmol/L (ref 3.5–5.1)
Sodium: 138 mmol/L (ref 135–145)

## 2023-08-08 ENCOUNTER — Other Ambulatory Visit (HOSPITAL_COMMUNITY): Payer: Self-pay | Admitting: *Deleted

## 2023-08-08 MED ORDER — POTASSIUM CHLORIDE ER 10 MEQ PO TBCR: 20.0000 meq | EXTENDED_RELEASE_TABLET | Freq: Every day | ORAL | 2 refills | Status: DC

## 2023-08-18 NOTE — Telephone Encounter (Signed)
VOB initiated with [employer] for ORTHOVISC via FAX.

## 2023-08-21 NOTE — Telephone Encounter (Signed)
Prior Auth APPROVED for Orthovisc for RIGHT knee OA.   PA# [id number] Valid: 08/18/23-02/14/24 4 units

## 2023-08-25 NOTE — Telephone Encounter (Signed)
Called patient to set up an appointment for injections. He was confused as to why we would be looking into knee injections and said that he was not having any issues and did not discuss this with anyone.

## 2023-08-25 NOTE — Telephone Encounter (Signed)
ORTHOVISC for RIGHT knee OA   Primary Insurance: [employer] [place] PPO Co-pay: n/a Co-insurance: 20% Deductible: $4,524.67 of $5,000 met (must be met for coverage to apply) Prior Auth: APPROVED PA# [id number] Valid: 08/18/23-02/14/24 4 units   Knee Injection History

## 2023-08-26 DIAGNOSIS — M25511 Pain in right shoulder: Secondary | ICD-10-CM | POA: Diagnosis not present

## 2023-08-26 DIAGNOSIS — M66821 Spontaneous rupture of other tendons, right upper arm: Secondary | ICD-10-CM | POA: Diagnosis not present

## 2023-09-16 DIAGNOSIS — M25511 Pain in right shoulder: Secondary | ICD-10-CM | POA: Diagnosis not present

## 2023-09-22 ENCOUNTER — Ambulatory Visit: Payer: BC Managed Care – PPO | Attending: Cardiology

## 2023-09-22 DIAGNOSIS — I4819 Other persistent atrial fibrillation: Secondary | ICD-10-CM

## 2023-09-23 DIAGNOSIS — S46111A Strain of muscle, fascia and tendon of long head of biceps, right arm, initial encounter: Secondary | ICD-10-CM | POA: Diagnosis not present

## 2023-09-23 LAB — CBC
Hematocrit: 47.8 % (ref 37.5–51.0)
Hemoglobin: 16.3 g/dL (ref 13.0–17.7)
MCH: 31.8 pg (ref 26.6–33.0)
MCHC: 34.1 g/dL (ref 31.5–35.7)
MCV: 93 fL (ref 79–97)
Platelets: 307 10*3/uL (ref 150–450)
RBC: 5.13 x10E6/uL (ref 4.14–5.80)
RDW: 12.4 % (ref 11.6–15.4)
WBC: 8.7 10*3/uL (ref 3.4–10.8)

## 2023-09-23 LAB — BASIC METABOLIC PANEL
BUN/Creatinine Ratio: 15 (ref 9–20)
BUN: 23 mg/dL (ref 6–24)
CO2: 22 mmol/L (ref 20–29)
Calcium: 9.3 mg/dL (ref 8.7–10.2)
Chloride: 104 mmol/L (ref 96–106)
Creatinine, Ser: 1.49 mg/dL — ABNORMAL HIGH (ref 0.76–1.27)
Glucose: 79 mg/dL (ref 70–99)
Potassium: 4.5 mmol/L (ref 3.5–5.2)
Sodium: 142 mmol/L (ref 134–144)
eGFR: 57 mL/min/{1.73_m2} — ABNORMAL LOW (ref >59)

## 2023-10-08 ENCOUNTER — Telehealth (HOSPITAL_COMMUNITY): Payer: Self-pay | Admitting: *Deleted

## 2023-10-08 NOTE — Telephone Encounter (Addendum)
 Received call from patient regarding upcoming cardiac imaging study; pt verbalizes understanding of appt date/time, parking situation and where to check in, pre-test NPO status and medications ordered, and verified current allergies; name and call back number provided for further questions should they arise Johney Frame RN Navigator Cardiac Imaging [facility] [phone] office [phone] cell

## 2023-10-08 NOTE — Telephone Encounter (Signed)
Attempted to call patient regarding upcoming cardiac CT appointment. Left message on voicemail with name and callback number Hayley Sharpe RN Navigator Cardiac Imaging [facility] [phone] Office   

## 2023-10-09 ENCOUNTER — Ambulatory Visit (HOSPITAL_COMMUNITY)
Admission: RE | Admit: 2023-10-09 | Discharge: 2023-10-09 | Disposition: A | Discharge status: Home / Self Care | Payer: BC Managed Care – PPO | Source: Ambulatory Visit | Attending: Cardiology | Admitting: Cardiology

## 2023-10-09 DIAGNOSIS — I4819 Other persistent atrial fibrillation: Secondary | ICD-10-CM | POA: Diagnosis not present

## 2023-10-09 MED ORDER — IOHEXOL 350 MG/ML SOLN
95.0000 mL | Freq: Once | INTRAVENOUS | Status: AC | PRN
  Administered 2023-10-09: 95 mL via INTRAVENOUS

## 2023-10-16 NOTE — Anesthesia Preprocedure Evaluation (Addendum)
Anesthesia Evaluation  Patient identified by MRN, date of birth, ID band Patient awake    Reviewed: Allergy & Precautions, H&P , NPO status , Patient's Chart, lab work & pertinent test results  Airway Mallampati: III  TM Distance: >3 FB Neck ROM: Full    Dental no notable dental hx. (+) Teeth Intact, Dental Advisory Given   Pulmonary neg pulmonary ROS, Patient abstained from smoking., former smoker   Pulmonary exam normal breath sounds clear to auscultation       Cardiovascular Exercise Tolerance: Good hypertension, Pt. on medications + dysrhythmias Atrial Fibrillation  Rhythm:Regular Rate:Normal     Neuro/Psych  Headaches  Anxiety      negative psych ROS   GI/Hepatic Neg liver ROS,GERD  Medicated,,  Endo/Other  negative endocrine ROS    Renal/GU negative Renal ROS  negative genitourinary   Musculoskeletal  (+) Arthritis , Osteoarthritis,    Abdominal   Peds  Hematology negative hematology ROS (+)   Anesthesia Other Findings   Reproductive/Obstetrics negative OB ROS                             Anesthesia Physical Anesthesia Plan  ASA: 3  Anesthesia Plan: General   Post-op Pain Management: Tylenol PO (pre-op)*   Induction: Intravenous  PONV Risk Score and Plan: 3 and Ondansetron, Dexamethasone and Midazolam  Airway Management Planned: Oral ETT  Additional Equipment:   Intra-op Plan:   Post-operative Plan: Extubation in OR  Informed Consent: I have reviewed the patients History and Physical, chart, labs and discussed the procedure including the risks, benefits and alternatives for the proposed anesthesia with the patient or authorized representative who has indicated his/her understanding and acceptance.     Dental advisory given  Plan Discussed with: CRNA  Anesthesia Plan Comments:        Anesthesia Quick Evaluation

## 2023-10-16 NOTE — Pre-Procedure Instructions (Signed)
Attempted to call patient regarding procedure instructions for tomorrow.  Left voicemail on the following items: Arrival time 0800 Nothing to eat or drink after midnight No meds AM of procedure Responsible person to drive you home and stay with you for 24 hrs  Have you missed any doses of anti-coagulant Xarelto- should be taken once a day.  If you have missed any doses please let us know.

## 2023-10-17 ENCOUNTER — Encounter (HOSPITAL_COMMUNITY)
Admission: RE | Disposition: A | Discharge status: Home / Self Care | Payer: Self-pay | Source: Home / Self Care | Attending: Cardiology

## 2023-10-17 ENCOUNTER — Other Ambulatory Visit (HOSPITAL_COMMUNITY): Payer: Self-pay

## 2023-10-17 ENCOUNTER — Other Ambulatory Visit: Payer: Self-pay

## 2023-10-17 ENCOUNTER — Ambulatory Visit (HOSPITAL_COMMUNITY): Payer: BC Managed Care – PPO | Admitting: Anesthesiology

## 2023-10-17 ENCOUNTER — Ambulatory Visit (HOSPITAL_COMMUNITY)
Admission: RE | Admit: 2023-10-17 | Discharge: 2023-10-17 | Disposition: A | Discharge status: Home / Self Care | Payer: BC Managed Care – PPO | Attending: Cardiology | Admitting: Cardiology

## 2023-10-17 DIAGNOSIS — E669 Obesity, unspecified: Secondary | ICD-10-CM | POA: Insufficient documentation

## 2023-10-17 DIAGNOSIS — Z79899 Other long term (current) drug therapy: Secondary | ICD-10-CM | POA: Diagnosis not present

## 2023-10-17 DIAGNOSIS — K219 Gastro-esophageal reflux disease without esophagitis: Secondary | ICD-10-CM | POA: Insufficient documentation

## 2023-10-17 DIAGNOSIS — Z6837 Body mass index (BMI) 37.0-37.9, adult: Secondary | ICD-10-CM | POA: Insufficient documentation

## 2023-10-17 DIAGNOSIS — I4819 Other persistent atrial fibrillation: Secondary | ICD-10-CM | POA: Diagnosis not present

## 2023-10-17 DIAGNOSIS — I4891 Unspecified atrial fibrillation: Secondary | ICD-10-CM | POA: Diagnosis not present

## 2023-10-17 DIAGNOSIS — Z7901 Long term (current) use of anticoagulants: Secondary | ICD-10-CM | POA: Insufficient documentation

## 2023-10-17 DIAGNOSIS — I10 Essential (primary) hypertension: Secondary | ICD-10-CM | POA: Diagnosis not present

## 2023-10-17 DIAGNOSIS — Z87891 Personal history of nicotine dependence: Secondary | ICD-10-CM | POA: Insufficient documentation

## 2023-10-17 HISTORY — PX: ATRIAL FIBRILLATION ABLATION: EP1191

## 2023-10-17 LAB — POCT ACTIVATED CLOTTING TIME: Activated Clotting Time: 325 s

## 2023-10-17 SURGERY — ATRIAL FIBRILLATION ABLATION
Anesthesia: General

## 2023-10-17 MED ORDER — SODIUM CHLORIDE 0.9 % IV SOLN: 250.0000 mL | INTRAVENOUS | Status: DC | PRN

## 2023-10-17 MED ORDER — SODIUM CHLORIDE 0.9 % IV SOLN
INTRAVENOUS | Status: DC
Start: 2023-10-17 — End: 2023-10-17

## 2023-10-17 MED ORDER — ONDANSETRON HCL 4 MG/2ML IJ SOLN
INTRAMUSCULAR | Status: DC | PRN
  Administered 2023-10-17: 4 mg via INTRAVENOUS

## 2023-10-17 MED ORDER — ALBUMIN HUMAN 5 % IV SOLN: INTRAVENOUS | Status: DC | PRN

## 2023-10-17 MED ORDER — ROCURONIUM BROMIDE 10 MG/ML (PF) SYRINGE
PREFILLED_SYRINGE | INTRAVENOUS | Status: DC | PRN
  Administered 2023-10-17: 10 mg via INTRAVENOUS
  Administered 2023-10-17: 70 mg via INTRAVENOUS

## 2023-10-17 MED ORDER — PANTOPRAZOLE SODIUM 40 MG PO TBEC
40.0000 mg | DELAYED_RELEASE_TABLET | Freq: Every day | ORAL | 0 refills | Status: DC
  Filled 2023-10-17: qty 45, 45d supply, fill #0

## 2023-10-17 MED ORDER — PROTAMINE SULFATE 10 MG/ML IV SOLN
INTRAVENOUS | Status: DC | PRN
  Administered 2023-10-17: 35 mg via INTRAVENOUS

## 2023-10-17 MED ORDER — SUGAMMADEX SODIUM 200 MG/2ML IV SOLN
INTRAVENOUS | Status: DC | PRN
  Administered 2023-10-17: 300 mg via INTRAVENOUS

## 2023-10-17 MED ORDER — PROPOFOL 10 MG/ML IV BOLUS
INTRAVENOUS | Status: DC | PRN
  Administered 2023-10-17: 50 mg via INTRAVENOUS
  Administered 2023-10-17: 150 mg via INTRAVENOUS

## 2023-10-17 MED ORDER — RIVAROXABAN 20 MG PO TABS
20.0000 mg | ORAL_TABLET | Freq: Every day | ORAL | Status: DC
  Administered 2023-10-17: 20 mg via ORAL
  Filled 2023-10-17: qty 1

## 2023-10-17 MED ORDER — HEPARIN (PORCINE) IN NACL 1000-0.9 UT/500ML-% IV SOLN
INTRAVENOUS | Status: DC | PRN
  Administered 2023-10-17 (×3): 500 mL

## 2023-10-17 MED ORDER — COLCHICINE 0.6 MG PO TABS
0.6000 mg | ORAL_TABLET | Freq: Two times a day (BID) | ORAL | 0 refills | Status: DC
  Filled 2023-10-17: qty 10, 5d supply, fill #0

## 2023-10-17 MED ORDER — ONDANSETRON HCL 4 MG/2ML IJ SOLN
4.0000 mg | Freq: Four times a day (QID) | INTRAMUSCULAR | Status: DC | PRN
Start: 2023-10-17 — End: 2023-10-17

## 2023-10-17 MED ORDER — ACETAMINOPHEN 325 MG PO TABS: 650.0000 mg | ORAL_TABLET | ORAL | Status: DC | PRN

## 2023-10-17 MED ORDER — LIDOCAINE 2% (20 MG/ML) 5 ML SYRINGE
INTRAMUSCULAR | Status: DC | PRN
  Administered 2023-10-17: 60 mg via INTRAVENOUS

## 2023-10-17 MED ORDER — DEXAMETHASONE SODIUM PHOSPHATE 10 MG/ML IJ SOLN
INTRAMUSCULAR | Status: DC | PRN
  Administered 2023-10-17: 10 mg via INTRAVENOUS

## 2023-10-17 MED ORDER — MIDAZOLAM HCL 2 MG/2ML IJ SOLN
INTRAMUSCULAR | Status: DC | PRN
  Administered 2023-10-17: 2 mg via INTRAVENOUS

## 2023-10-17 MED ORDER — FENTANYL CITRATE (PF) 250 MCG/5ML IJ SOLN
INTRAMUSCULAR | Status: DC | PRN
  Administered 2023-10-17: 100 ug via INTRAVENOUS

## 2023-10-17 MED ORDER — HEPARIN SODIUM (PORCINE) 1000 UNIT/ML IJ SOLN
INTRAMUSCULAR | Status: AC
  Filled 2023-10-17: qty 10

## 2023-10-17 MED ORDER — ATROPINE SULFATE 1 MG/10ML IJ SOSY
PREFILLED_SYRINGE | INTRAMUSCULAR | Status: DC | PRN
  Administered 2023-10-17: 1 mg via INTRAVENOUS

## 2023-10-17 MED ORDER — ACETAMINOPHEN 500 MG PO TABS
1000.0000 mg | ORAL_TABLET | Freq: Once | ORAL | Status: AC
  Administered 2023-10-17: 1000 mg via ORAL
  Filled 2023-10-17: qty 2

## 2023-10-17 MED ORDER — HEPARIN SODIUM (PORCINE) 1000 UNIT/ML IJ SOLN
INTRAMUSCULAR | Status: DC | PRN
  Administered 2023-10-17: 19000 [IU] via INTRAVENOUS
  Administered 2023-10-17: 4000 [IU] via INTRAVENOUS

## 2023-10-17 MED ORDER — SODIUM CHLORIDE 0.9% FLUSH
3.0000 mL | INTRAVENOUS | Status: DC | PRN
Start: 2023-10-17 — End: 2023-10-17

## 2023-10-17 MED ORDER — PHENYLEPHRINE HCL-NACL 20-0.9 MG/250ML-% IV SOLN
INTRAVENOUS | Status: DC | PRN
  Administered 2023-10-17: 40 ug/min via INTRAVENOUS

## 2023-10-17 MED ORDER — PANTOPRAZOLE SODIUM 40 MG PO TBEC
40.0000 mg | DELAYED_RELEASE_TABLET | Freq: Every day | ORAL | Status: DC
  Administered 2023-10-17: 40 mg via ORAL
  Filled 2023-10-17: qty 1

## 2023-10-17 MED ORDER — SODIUM CHLORIDE 0.9% FLUSH: 3.0000 mL | Freq: Two times a day (BID) | INTRAVENOUS | Status: DC

## 2023-10-17 MED ORDER — COLCHICINE 0.6 MG PO TABS
0.6000 mg | ORAL_TABLET | Freq: Two times a day (BID) | ORAL | Status: DC
  Administered 2023-10-17: 0.6 mg via ORAL
  Filled 2023-10-17: qty 1

## 2023-10-17 SURGICAL SUPPLY — 20 items
BAG SNAP BAND KOVER 36X36 (MISCELLANEOUS) IMPLANT
CABLE PFA RX CATH CONN (CABLE) IMPLANT
CATH FARAWAVE ABLATION 31 (CATHETERS) IMPLANT
CATH OCTARAY 2.0 F 3-3-3-3-3 (CATHETERS) IMPLANT
CATH SOUNDSTAR ECO 8FR (CATHETERS) IMPLANT
CATH WEBSTER BI DIR CS D-F CRV (CATHETERS) IMPLANT
CLOSURE PERCLOSE PROSTYLE (VASCULAR PRODUCTS) IMPLANT
COVER SWIFTLINK CONNECTOR (BAG) ×1 IMPLANT
DILATOR VESSEL 38 20CM 16FR (INTRODUCER) IMPLANT
GUIDEWIRE INQWIRE 1.5J.035X260 (WIRE) IMPLANT
INQWIRE 1.5J .035X260CM (WIRE) ×1
MAT PREVALON FULL STRYKER (MISCELLANEOUS) IMPLANT
PACK EP LF (CUSTOM PROCEDURE TRAY) ×1 IMPLANT
PAD DEFIB RADIO PHYSIO CONN (PAD) ×1 IMPLANT
PATCH CARTO3 (PAD) IMPLANT
SHEATH FARADRIVE STEERABLE (SHEATH) IMPLANT
SHEATH PINNACLE 8F 10CM (SHEATH) IMPLANT
SHEATH PINNACLE 9F 10CM (SHEATH) IMPLANT
SHEATH PROBE COVER 6X72 (BAG) IMPLANT
SHEATH WIRE KIT BAYLIS SL1 (KITS) IMPLANT

## 2023-10-17 NOTE — Progress Notes (Signed)
Merry Proud, NP in to see client and bedrest extended to 1600 and per Merry Proud, NP give xarelto at 1600

## 2023-10-17 NOTE — Anesthesia Postprocedure Evaluation (Signed)
Anesthesia Post Note  Patient: AMONTE NUTTING  Procedure(s) Performed: ATRIAL FIBRILLATION ABLATION     Patient location during evaluation: Cath Lab Anesthesia Type: General Level of consciousness: awake and alert Pain management: pain level controlled Vital Signs Assessment: post-procedure vital signs reviewed and stable Respiratory status: spontaneous breathing, nonlabored ventilation, respiratory function stable and patient connected to nasal cannula oxygen Cardiovascular status: blood pressure returned to baseline and stable Postop Assessment: no apparent nausea or vomiting Anesthetic complications: no  No notable events documented.  Last Vitals:  Vitals:   10/17/23 1315 10/17/23 1330  BP: 119/87 122/87  Pulse: 78 75  Resp: 15 12  Temp:    SpO2: (!) 86% 96%    Last Pain:  Vitals:   10/17/23 1234  TempSrc: Temporal  PainSc: 0-No pain                 Dawna Jakes,W. EDMOND

## 2023-10-17 NOTE — Transfer of Care (Signed)
Immediate Anesthesia Transfer of Care Note  Patient: Gregory Barrett  Procedure(s) Performed: ATRIAL FIBRILLATION ABLATION  Patient Location: PACU  Anesthesia Type:General  Level of Consciousness: awake and alert   Airway & Oxygen Therapy: Patient Spontanous Breathing and Patient connected to face mask oxygen  Post-op Assessment: Report given to RN and Post -op Vital signs reviewed and stable  Post vital signs: Reviewed and stable  Last Vitals:  Vitals Value Taken Time  BP    Temp    Pulse    Resp    SpO2      Last Pain:  Vitals:   10/17/23 0814  TempSrc: Oral         Complications: No notable events documented.

## 2023-10-17 NOTE — Anesthesia Procedure Notes (Signed)
Procedure Name: Intubation Date/Time: 10/17/2023 10:25 AM  Performed by: Gaynelle Adu, MDPre-anesthesia Checklist: Patient identified, Emergency Drugs available, Suction available and Patient being monitored Patient Re-evaluated:Patient Re-evaluated prior to induction Oxygen Delivery Method: Circle system utilized Preoxygenation: Pre-oxygenation with 100% oxygen Induction Type: IV induction Ventilation: Two handed mask ventilation required and Oral airway inserted - appropriate to patient size Laryngoscope Size: Glidescope and 4 (Attempted Miller 2. Couldn't lift the epiglottis) Grade View: Grade II Tube type: Oral Tube size: 8.0 mm Number of attempts: 2 Airway Equipment and Method: Oral airway, Rigid stylet and Video-laryngoscopy Placement Confirmation: ETT inserted through vocal cords under direct vision, positive ETCO2 and breath sounds checked- equal and bilateral Secured at: 22 cm Tube secured with: Tape Dental Injury: Teeth and Oropharynx as per pre-operative assessment

## 2023-10-17 NOTE — Progress Notes (Signed)
Up and walked and bleeding noted right groin; back to bed and pressure held x and no further oozing noted; no hematoma

## 2023-10-17 NOTE — H&P (Signed)
Electrophysiology Office Follow up Visit Note:     Date:  10/17/2023    ID:  Gregory Barrett, DOB 1971/02/21, MRN [id number]   PCP:  Kristian Covey, MD           [facility] Cardiologist:  Hillis Range, MD (Inactive)  [facility] Electrophysiologist:  Lanier Prude, MD      Interval History:     Gregory Barrett is a 52 y.o. male who presents for a follow up visit.    Last seen April 04, 2023 by Clide Cliff.  The patient was previously followed by Dr. Johney Frame.  He had a prior ablation with Dr. Johney Frame on April 03, 2017.  At the appointment Clide Cliff he reported going back in atrial fibrillation on Mar 27, 2023.  He was set up for cardioversion which was performed on April 25, 2023.  This was not successful. He was restarted on Xarelto prior to the cardioversion.  He is referred to discuss the possibility of repeat catheter ablation.   He is highly symptomatic with his AF w RVR. He has fatigue. "Can't function". Works as an [employer]. Wife is present today for appointment. No problems with his blood thinner.    Presents for an A-fib ablation today. procedure reviewed.   Objective Past medical, surgical, social and family history were reviewed.   ROS:   Please see the history of present illness.    All other systems reviewed and are negative.   EKGs/Labs/Other Studies Reviewed:     The following studies were reviewed today:   April 04, 2023 EKG shows atrial fibrillation with rapid ventricular rate   EKG Interpretation Date/Time:                  Thursday May 15 2023 15:52:43 EDT Ventricular Rate:         101 PR Interval:                   QRS Duration:             82 QT Interval:                 324 QTC Calculation:420 R Axis:                         17   Text Interpretation:Atrial fibrillation with rapid ventricular response Confirmed by Steffanie Dunn [phone]) on 05/15/2023 3:56:05 PM     Physical Exam:     VS:  BP 118/90   Pulse 80   Ht 6' (1.829 m)   Wt 279 lb  9.6 oz (126.8 kg)   SpO2 98%   BMI 37.92 kg/m         Wt Readings from Last 3 Encounters:  05/15/23 279 lb 9.6 oz (126.8 kg)  04/25/23 270 lb (122.5 kg)  04/04/23 275 lb 3.2 oz (124.8 kg)      GEN:  Well nourished, well developed in no acute distress CARDIAC: Regular rhythm, no murmurs, rubs, gallops RESPIRATORY:  Clear to auscultation without rales, wheezing or rhonchi      Assessment ASSESSMENT:     1. Persistent atrial fibrillation (HCC)     PLAN:     In order of problems listed above:   #Persistent atrial fibrillation Prior A-fib ablation April 03, 2017 with Dr. Johney Frame.  Is now on Xarelto after recent cardioversion on April 25, 2023.  I discussed treatment options with the patient including antiarrhythmic drugs, continuing  a watchful waiting/conservative approach or pursuing repeat catheter ablation.   He is very symptomatic. I discussed dofetilide and he wishes to proceed. Will plan for DCCV in the EP lab during dofetilde load with a 360J defibrillator. The tikosyn loading process including its risks were discussed in detail and he wishes to proceed.   Discussed treatment options today for AF including antiarrhythmic drug therapy and ablation. Discussed risks, recovery and likelihood of success with each treatment strategy. Risk, benefits, and alternatives to EP study and ablation for afib were discussed. These risks include but are not limited to stroke, bleeding, vascular damage, tamponade, perforation, damage to the esophagus, lungs, phrenic nerve and other structures, pulmonary vein stenosis, worsening renal function, coronary vasospasm and death.  Discussed potential need for repeat ablation procedures and antiarrhythmic drugs after an initial ablation. The patient understands these risk and wishes to proceed.  We will therefore proceed with catheter ablation at the next available time.  Carto, ICE, anesthesia are requested for the procedure.  Will also obtain CT PV protocol  prior to the procedure to exclude LAA thrombus and further evaluate atrial anatomy.   #Obesity Weight loss discussed during today's visit. Discussed link to AF treatment success rates.   Presents for PVI today. Procedure reviewed.   Signed, Steffanie Dunn, MD, [facility], [facility] 10/17/2023 Electrophysiology [facility]

## 2023-10-17 NOTE — Progress Notes (Signed)
Up and walked and tolerated well; right groin stable, no bleeding or hematoma 

## 2023-10-20 ENCOUNTER — Encounter (HOSPITAL_COMMUNITY): Payer: Self-pay | Admitting: Cardiology

## 2023-10-27 ENCOUNTER — Telehealth: Payer: Self-pay

## 2023-10-27 DIAGNOSIS — I77810 Thoracic aortic ectasia: Secondary | ICD-10-CM

## 2023-10-27 NOTE — Telephone Encounter (Signed)
The patient has been notified of the result and verbalized understanding.  All questions (if any) were answered. Frutoso Schatz, RN 10/27/2023 1:26 PM   Patient does not have a general cardiologist. Referral will be placed for Dr. Flora Lipps

## 2023-10-27 NOTE — Telephone Encounter (Signed)
-----   Message from Rossie Muskrat [facility] sent at 10/26/2023 12:36 PM EST ----- Wenda Low, can we make sure he has follow up with general cardiology re: aortic dilation. If he doesn't have a cardiologist, refer to Guinevere Scarlet. Thanks!  Sheria Lang T. Lalla Brothers, MD, [facility] Card[facility] Electrophysiology

## 2023-11-14 ENCOUNTER — Ambulatory Visit (HOSPITAL_COMMUNITY)
Admission: RE | Admit: 2023-11-14 | Discharge: 2023-11-14 | Disposition: A | Discharge status: Home / Self Care | Payer: BC Managed Care – PPO | Source: Ambulatory Visit | Attending: Physician Assistant | Admitting: Physician Assistant

## 2023-11-14 ENCOUNTER — Encounter (HOSPITAL_COMMUNITY): Payer: Self-pay | Admitting: Physician Assistant

## 2023-11-14 VITALS — BP 128/88 | HR 81 | Ht 72.0 in | Wt 277.4 lb

## 2023-11-14 DIAGNOSIS — I4819 Other persistent atrial fibrillation: Secondary | ICD-10-CM | POA: Insufficient documentation

## 2023-11-14 DIAGNOSIS — I10 Essential (primary) hypertension: Secondary | ICD-10-CM | POA: Diagnosis not present

## 2023-11-14 DIAGNOSIS — Z79899 Other long term (current) drug therapy: Secondary | ICD-10-CM | POA: Insufficient documentation

## 2023-11-14 DIAGNOSIS — Z7901 Long term (current) use of anticoagulants: Secondary | ICD-10-CM | POA: Insufficient documentation

## 2023-11-14 DIAGNOSIS — Z5181 Encounter for therapeutic drug level monitoring: Secondary | ICD-10-CM | POA: Insufficient documentation

## 2023-11-14 DIAGNOSIS — Z6837 Body mass index (BMI) 37.0-37.9, adult: Secondary | ICD-10-CM | POA: Insufficient documentation

## 2023-11-14 DIAGNOSIS — E669 Obesity, unspecified: Secondary | ICD-10-CM | POA: Insufficient documentation

## 2023-11-14 MED ORDER — ESOMEPRAZOLE MAGNESIUM 20 MG PO CPDR: 20.0000 mg | DELAYED_RELEASE_CAPSULE | Freq: Every day | ORAL | Status: AC

## 2023-11-14 NOTE — Progress Notes (Signed)
Primary Care Physician: Pcp, No Primary Cardiologist: Dr Flora Lipps (new) Primary Electrophysiologist: Dr Lalla Brothers Referring Physician: Dr Johney Frame   Gregory Barrett is a 53 y.o. male with a history of HTN and atrial fibrillation who presents for follow up in the [facility], seen remotely by Rudi Coco and Dr Johney Frame. The patient underwent an afib ablation with Dr Johney Frame 04/03/2017. Patient has a CHADS2VASC score of 1.  Patient reports that he went back into afib on 03/27/23 with tachypalpitations and fatigue. He underwent DCCV on 04/25/23 which was unsuccessful. Seen by Dr Lalla Brothers who recommended dofetilide loading as a bridge to ablation.   S/p Tikosyn admission 8/12-15/24 with successful DCCV on 8/14. He is s/p repeat afib ablation with Dr Lalla Brothers on 10/17/23.  Patient returns for follow up for atrial fibrillation. He reports that he has not had any interim episodes of afib. He denies any chest pain. His groin sites were tender post ablation but this has resolved. He does feel like his chronic indigestion has been worse since changing from Nexium to Protonix. He also feels fatigued despite being in SR.   Today, he denies symptoms of palpitations, chest pain, orthopnea, PND, lower extremity edema, dizziness, presyncope, syncope, snoring, daytime somnolence, bleeding, or neurologic sequela. The patient is tolerating medications without difficulties and is otherwise without complaint today.    Atrial Fibrillation Risk Factors:  he does not have symptoms or diagnosis of sleep apnea. he does not have a history of rheumatic fever.   Atrial Fibrillation Management history:  Previous antiarrhythmic drugs: flecainide, dofetilide  Previous cardioversions: 04/10/17, 04/28/17, 04/25/23, 06/11/23 Previous ablations: 04/03/17, 10/17/23 Anticoagulation history: Xarelto    Past Medical History:  Diagnosis Date   Anxiety    when he was in A-fib, attributed to adrenal gland and  fatigue   Arthritis    legs, ankles   GERD (gastroesophageal reflux disease)    Hyperaldosteronism (HCC)    Hypertension    Melanoma of lower back (HCC) 2000s   Migraine    none for years   Persistent atrial fibrillation (HCC)    Pneumonia    "a few times; last time ~ 2010" (04/03/2017)    Current Outpatient Medications  Medication Sig Dispense Refill   diltiazem (CARDIZEM CD) 120 MG 24 hr capsule TAKE 1 CAPSULE BY MOUTH EVERY DAY 90 capsule 1   diltiazem (CARDIZEM) 30 MG tablet Take 1 Tablet Every 4 Hours As Needed For HR >100 and TOP BP >100 45 tablet 1   dofetilide (TIKOSYN) 500 MCG capsule Take 1 capsule (500 mcg total) by mouth 2 (two) times daily. 60 capsule 6   pantoprazole (PROTONIX) 40 MG tablet Take 1 tablet (40 mg total) by mouth daily. 45 tablet 0   potassium chloride (KLOR-CON) 10 MEQ tablet Take 2 tablets (20 mEq total) by mouth daily. (Patient taking differently: Take 10 mEq by mouth 2 (two) times daily.) 180 tablet 2   rivaroxaban (XARELTO) 20 MG TABS tablet Take 1 tablet (20 mg total) by mouth daily with supper. 30 tablet 6   colchicine 0.6 MG tablet Take 1 tablet (0.6 mg total) by mouth 2 (two) times daily for 5 days. 10 tablet 0   No current facility-administered medications for this encounter.    ROS- All systems are reviewed and negative except as per the HPI above.  Physical Exam: Vitals:   11/14/23 1052  BP: 128/88  Pulse: 81  Weight: 125.8 kg  Height: 6' (1.829 m)  GEN: Well nourished, well developed in no acute distress CARDIAC: Regular rate and rhythm, no murmurs, rubs, gallops RESPIRATORY:  Clear to auscultation without rales, wheezing or rhonchi  ABDOMEN: Soft, non-tender, non-distended EXTREMITIES:  No edema; No deformity    Wt Readings from Last 3 Encounters:  11/14/23 125.8 kg  10/17/23 123.4 kg  07/21/23 123.4 kg    EKG today demonstrates  SR Vent. rate 81 BPM PR interval 184 ms QRS duration 92 ms QT/QTcB 408/473 ms   Echo  08/20/16 demonstrated  Left ventricle: The cavity size was normal. Wall thickness was    increased in a pattern of mild LVH. Systolic function was normal.    The estimated ejection fraction was in the range of 50% to 55%.    Wall motion was normal; there were no regional wall motion    abnormalities. There was no evidence of elevated ventricular    filling pressure by Doppler parameters.  - Aorta: Ascending aortic diameter: 43 mm (S).  - Ascending aorta: The ascending aorta was mildly dilated.  - Left atrium: Volume/bsa, ES (1-plane Simpson&'s, A4C): 30.2    ml/m^2.  - Right ventricle: The cavity size was mildly dilated. Wall    thickness was normal.   Epic records are reviewed at length today  CHA2DS2-VASc Score = 1  The patient's score is based upon: CHF History: 0 HTN History: 1 Diabetes History: 0 Stroke History: 0 Vascular Disease History: 0 Age Score: 0 Gender Score: 0         ASSESSMENT AND PLAN: Persistent Atrial Fibrillation (ICD10:  I48.19) The patient's CHA2DS2-VASc score is 1, indicating a 0.6% annual risk of stroke.   S/p afib ablation 04/03/17 and 10/17/23 S/p Tikosyn admission 8/12-15/24  Patient appears to be maintaining SR Continue dofetilide 500 mcg BID Will discontinue diltiazem 120 mg to see if his fatigue improves.  Continue diltiazem 30 mg PRN q 4 hours for heart racing Continue Xarelto 20 mg daily with no missed doses for 3 months post ablation.   High Risk Medication Monitoring (ICD 10: Z79.899) QT interval on ECG appropriate for dofetilide Last bmet and magnesium reviewed  HTN Stable today Discontinue diltiazem as above  Obesity Body mass index is 37.62 kg/m.  Encouraged lifestyle modification   Follow up with Otilio Saber and Dr Flora Lipps as scheduled.   Jorja Loa PA-C Afib Clinic [facility] [address] [place], [place] [address] [phone] 11/14/2023 11:05 AM

## 2024-01-22 NOTE — Progress Notes (Unsigned)
  Electrophysiology Office Note:   Date:  01/23/2024  ID:  Gregory Barrett, DOB 1971/07/17, MRN [id number]  Primary Cardiologist: Hillis Range, MD (Inactive) Electrophysiologist: Lanier Prude, MD      History of Present Illness:   Gregory Barrett is a 53 y.o. male with h/o HTN and atrial fibrillation seen today for routine electrophysiology followup.   S/p redo ablation 09/2023  Since last being seen in our clinic the patient reports doing very well. Overall, he denies chest pain, palpitations, dyspnea, PND, orthopnea, nausea, vomiting, dizziness, syncope, edema, weight gain, or early satiety.   Review of systems complete and found to be negative unless listed in HPI.   EP Information / Studies Reviewed:    EKG is ordered today. Personal review as below.  EKG Interpretation Date/Time:  Friday January 23 2024 11:56:52 EDT Ventricular Rate:  79 PR Interval:  192 QRS Duration:  94 QT Interval:  406 QTC Calculation: 465 R Axis:   16  Text Interpretation: Normal sinus rhythm Normal ECG When compared with ECG of 14-Nov-2023 10:54, No significant change was found Confirmed by Maxine Glenn [phone]) on 01/23/2024 1:04:01 PM    Arrhythmia/Device History S/p PVI and additional Left atrial focus 03/2017 S/p PVI and posterior ablation 09/2023   Physical Exam:   VS:  BP 112/84   Pulse 86   Ht 6' (1.829 m)   Wt 279 lb (126.6 kg)   SpO2 96%   BMI 37.84 kg/m    Wt Readings from Last 3 Encounters:  01/23/24 279 lb (126.6 kg)  11/14/23 277 lb 6.4 oz (125.8 kg)  10/17/23 272 lb (123.4 kg)     GEN: No acute distress NECK: No JVD; No carotid bruits CARDIAC: Regular rate and rhythm, no murmurs, rubs, gallops RESPIRATORY:  Clear to auscultation without rales, wheezing or rhonchi  ABDOMEN: Soft, non-tender, non-distended EXTREMITIES:  No edema; No deformity   ASSESSMENT AND PLAN:    Persistent AF S/p multiple ablations as above EKG today shows NSR with stable intervals Continue  tikosyn 500 mcg BID. Will continue this in shared decision making given that he has required multiple ablations.  Continue diltiazem prn only With CHA2DS2VASc of only 1 , will stop Xarelto for now.   Pt will alert [place] if he has any more AF, and is also agreeable to Loop recorder consideration. We discussed wearable technology as another option.   High Risk drug monitoring  QT intervals appropriate on [employer] today.   HTN Stable on current regimen   Obesity Body mass index is 37.84 kg/m.  Encouraged lifestyle modification     Follow up with EP APP in 3 months for follow up and loop consideration.   Signed, Graciella Freer, PA-C

## 2024-01-23 ENCOUNTER — Encounter: Payer: Self-pay | Admitting: Student

## 2024-01-23 ENCOUNTER — Ambulatory Visit: Payer: BC Managed Care – PPO | Attending: Student | Admitting: Student

## 2024-01-23 DIAGNOSIS — Z5181 Encounter for therapeutic drug level monitoring: Secondary | ICD-10-CM | POA: Diagnosis not present

## 2024-01-23 DIAGNOSIS — Z79899 Other long term (current) drug therapy: Secondary | ICD-10-CM

## 2024-01-23 DIAGNOSIS — I4819 Other persistent atrial fibrillation: Secondary | ICD-10-CM | POA: Diagnosis not present

## 2024-01-23 DIAGNOSIS — G4733 Obstructive sleep apnea (adult) (pediatric): Secondary | ICD-10-CM

## 2024-01-23 NOTE — Patient Instructions (Signed)
 Medication Instructions:  STOP Xarelto *If you need a refill on your cardiac medications before your next appointment, please call your pharmacy*  Lab Work: BMET, MAG-TODAY If you have labs (blood work) drawn today and your tests are completely normal, you will receive your results only by: MyChart Message (if you have MyChart) OR A paper copy in the mail If you have any lab test that is abnormal or we need to change your treatment, we will call you to review the results.  Follow-Up: At [facility], you and your health needs are our priority.  As part of our continuing mission to provide you with exceptional heart care, our providers are all part of one team.  This team includes your primary Cardiologist (physician) and Advanced Practice Providers or APPs (Physician Assistants and Nurse Practitioners) who all work together to provide you with the care you need, when you need it.  Your next appointment:   3 month(s)  Provider:   Casimiro Needle "Mardelle Matte" Lanna Poche, PA-C       1st Floor: - Lobby - Registration  - Pharmacy  - Lab - Cafe  2nd Floor: - PV Lab - Diagnostic Testing (echo, CT, nuclear med)  3rd Floor: - Vacant  4th Floor: - TCTS (cardiothoracic surgery) - AFib Clinic - Structural Heart Clinic - Vascular Surgery  - Vascular Ultrasound  5th Floor: - HeartCare Cardiology (general and EP) - Clinical Pharmacy for coumadin, hypertension, lipid, weight-loss medications, and med management appointments    Valet parking services will be available as well.

## 2024-01-26 ENCOUNTER — Other Ambulatory Visit (HOSPITAL_COMMUNITY): Payer: Self-pay | Admitting: *Deleted

## 2024-01-26 NOTE — Progress Notes (Unsigned)
 Cardiology Office Note:  .   Date:  01/28/2024  ID:  Gregory Barrett, DOB 05-14-1971, MRN [id number] PCP: Aviva Kluver  [facility] Providers Cardiologist:  Hillis Range, MD (Inactive) Electrophysiologist:  Lanier Prude, MD { History of Present Illness: .    Chief Complaint  Patient presents with   Thoracic Aortic Aneurysm    Gregory Barrett is a 53 y.o. male with history of Afib who presents for the evaluation of aortic aneurysm at the request of Lanier Prude, MD.    History of Present Illness   Gregory Barrett is a 54 year old male with aortic aneurysm, Afib, CAC, HTN, obesity who presents for evaluation. He was referred by Dr. Lalla Brothers for evaluation of his aortic aneurysm.  He has an aortic aneurysm measuring 45 mm. The aneurysm was not previously noted in past imaging, suggesting recent growth. No family history of aortic issues.  He has a history of persistent atrial fibrillation and has undergone two ablations. He experiences PVCs since having atrial fibrillation and is on Tikosyn as a maintenance therapy. He notes that the recovery from his second ablation was more challenging than the first. No chest pain, trouble breathing, or leg swelling.  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 The tumor and adrenal gland were removed approximately seven to eight years ago, and his high blood pressure resolved post-surgery. He does not currently have high blood pressure.  He reports a minimally elevated coronary calcium score of 5.6 at the 65th percentile. He has not had a heart attack or stroke and does not have a family history of heart disease. He has not been on cholesterol medication, and his cholesterol has not been checked in at least three years.  No sleep apnea. He does not have a primary care doctor currently. He used to smoke but now chews tobacco and does not drink alcohol  regularly. He works in [employer] account planner and is physically active, walking over 10,000 steps a day, mostly on stairs. He is married with five children and three grandchildren. He feels more drained since his last ablation but continues to maintain a high level of physical activity.          Problem List Persistent Afib -s/p ablation x 2  -CHADSVASC=1  2. Ascending aortic aneurysm  -45 mm 10/22/2023 3. CAC -CAC 5.6 (65th percentile) 4. HLD -T chol 166, LDL 118, HDL 43, TG 84 5. HTN 6. Hyperaldosteronism  -s/p L adrenal gland  7. Obesity -BMI 37    ROS: All other ROS reviewed and negative. Pertinent positives noted in the HPI.     Studies Reviewed: Marland Kitchen   EKG Interpretation Date/Time:  Wednesday January 28 2024 09:22:28 EDT Ventricular Rate:  82 PR Interval:  170 QRS Duration:  94 QT Interval:  376 QTC Calculation: 439 R Axis:   18  Text Interpretation: Normal sinus rhythm Normal ECG Confirmed by Lennie Odor [phone]) on 01/28/2024 9:23:20 AM    TTE 04/10/2017 - Left ventricle: Systolic function was normal. The estimated    ejection fraction was in the range of 55% to 60%. Wall motion was    normal; there were no regional wall motion abnormalities.  - Left atrium: The atrium was dilated. No evidence of thrombus in    the atrial cavity or appendage. No evidence of thrombus in the    atrial cavity or appendage.  - Right  atrium: The atrium was dilated. No evidence of thrombus in    the atrial cavity or appendage.  - Atrial septum: A patent foramen ovale cannot be excluded.  Physical Exam:   VS:  BP 120/70   Pulse 82   Ht 6' (1.829 m)   Wt 278 lb 3.2 oz (126.2 kg)   SpO2 99%   BMI 37.73 kg/m    Wt Readings from Last 3 Encounters:  01/28/24 278 lb 3.2 oz (126.2 kg)  01/23/24 279 lb (126.6 kg)  11/14/23 277 lb 6.4 oz (125.8 kg)    GEN: Well nourished, well developed in no acute distress NECK: No JVD; No carotid bruits CARDIAC: RRR, no murmurs, rubs,  gallops RESPIRATORY:  Clear to auscultation without rales, wheezing or rhonchi  ABDOMEN: Soft, non-tender, non-distended EXTREMITIES:  No edema; No deformity  ASSESSMENT AND PLAN: .   Assessment and Plan    Aortic Aneurysm Aortic aneurysm at 45 mm, not requiring surgery. Contributing factors: past hypertension, smoking. Surgery considered at 55 mm. - Order echocardiogram to assess aortic valve.  - Plan for repeat CT scan in one year. - Schedule follow-up in one year to discuss CT scan results. - Advise monitoring for symptoms such as chest pain or dyspnea.  Atrial Fibrillation Persistent atrial fibrillation post-ablation. Tikosyn effective for rhythm maintenance. No long-term anticoagulation needed due to CHADS-VASc score of 1. - Continue Tikosyn for atrial fibrillation management.  Hyperlipidemia Minimally elevated coronary calcium score. Cholesterol levels not checked in over three years. Potential statin therapy based on LDL. - Order fasting lipid panel. - Evaluate LDL levels to determine need for statin therapy.              Follow-up: Return in about 1 year (around 01/27/2025).   Signed, Lenna Gilford. Flora Lipps, MD, [facility]  [facility]  [address] [place], [place] [address] [phone]  9:43 AM

## 2024-01-28 ENCOUNTER — Encounter: Payer: Self-pay | Admitting: Cardiovascular Disease

## 2024-01-28 ENCOUNTER — Ambulatory Visit: Payer: BC Managed Care – PPO | Attending: Cardiovascular Disease | Admitting: Cardiovascular Disease

## 2024-01-28 VITALS — BP 120/70 | HR 82 | Ht 72.0 in | Wt 278.2 lb

## 2024-01-28 DIAGNOSIS — I150 Renovascular hypertension: Secondary | ICD-10-CM

## 2024-01-28 DIAGNOSIS — R931 Abnormal findings on diagnostic imaging of heart and coronary circulation: Secondary | ICD-10-CM

## 2024-01-28 DIAGNOSIS — I7121 Aneurysm of the ascending aorta, without rupture: Secondary | ICD-10-CM | POA: Diagnosis not present

## 2024-01-28 DIAGNOSIS — E782 Mixed hyperlipidemia: Secondary | ICD-10-CM

## 2024-01-28 DIAGNOSIS — I4819 Other persistent atrial fibrillation: Secondary | ICD-10-CM

## 2024-01-28 NOTE — Patient Instructions (Signed)
 Medication Instructions:  Your physician recommends that you continue on your current medications as directed. Please refer to the Current Medication list given to you today.    *If you need a refill on your cardiac medications before your next appointment, please call your pharmacy*   Lab Work: Your physician recommends that you return for lab work tomorrow for : LIPID PANEL   If you have labs (blood work) drawn today and your tests are completely normal, you will receive your results only by: MyChart Message (if you have MyChart) OR A paper copy in the mail If you have any lab test that is abnormal or we need to change your treatment, we will call you to review the results.   Testing/Procedures: Echo will be scheduled at [address].  Your physician has requested that you have an echocardiogram. Echocardiography is a painless test that uses sound waves to create images of your heart. It provides your doctor with information about the size and shape of your heart and how well your heart's chambers and valves are working. This procedure takes approximately one hour. There are no restrictions for this procedure. Please do NOT wear cologne, perfume, aftershave, or lotions (deodorant is allowed). Please arrive 15 minutes prior to your appointment time.    Follow-Up: At [facility], you and your health needs are our priority.  As part of our continuing mission to provide you with exceptional heart care, we have created designated Provider Care Teams.  These Care Teams include your primary Cardiologist (physician) and Advanced Practice Providers (APPs -  Physician Assistants and Nurse Practitioners) who all work together to provide you with the care you need, when you need it.  We recommend signing up for the patient portal called "MyChart".  Sign up information is provided on this After Visit Summary.  MyChart is used to connect with patients for Virtual Visits  (Telemedicine).  Patients are able to view lab/test results, encounter notes, upcoming appointments, etc.  Non-urgent messages can be sent to your provider as well.   To learn more about what you can do with MyChart, go to [email or web address].    Your next appointment:   1 year(s)  The format for your next appointment:   In Person  Provider:   Lennie Odor, MD     Other Instructions

## 2024-01-29 DIAGNOSIS — I4819 Other persistent atrial fibrillation: Secondary | ICD-10-CM | POA: Diagnosis not present

## 2024-01-29 LAB — MAGNESIUM: Magnesium: 2.1 mg/dL (ref 1.6–2.3)

## 2024-01-29 LAB — BASIC METABOLIC PANEL WITH GFR
BUN/Creatinine Ratio: 16 (ref 9–20)
BUN: 22 mg/dL (ref 6–24)
CO2: 23 mmol/L (ref 20–29)
Calcium: 9.4 mg/dL (ref 8.7–10.2)
Chloride: 105 mmol/L (ref 96–106)
Creatinine, Ser: 1.35 mg/dL — ABNORMAL HIGH (ref 0.76–1.27)
Glucose: 93 mg/dL (ref 70–99)
Potassium: 5.1 mmol/L (ref 3.5–5.2)
Sodium: 140 mmol/L (ref 134–144)
eGFR: 64 mL/min/{1.73_m2} (ref >59)

## 2024-02-19 ENCOUNTER — Encounter: Payer: Self-pay | Admitting: Cardiovascular Disease

## 2024-02-26 ENCOUNTER — Telehealth

## 2024-02-26 ENCOUNTER — Ambulatory Visit (HOSPITAL_COMMUNITY): Attending: Cardiovascular Disease

## 2024-02-26 DIAGNOSIS — I7121 Aneurysm of the ascending aorta, without rupture: Secondary | ICD-10-CM | POA: Insufficient documentation

## 2024-02-26 LAB — ECHOCARDIOGRAM COMPLETE
Area-P 1/2: 3.53 cm2
S' Lateral: 4 cm

## 2024-02-29 ENCOUNTER — Encounter: Payer: Self-pay | Admitting: Cardiovascular Disease

## 2024-04-23 ENCOUNTER — Ambulatory Visit: Attending: Student | Admitting: Student

## 2024-04-23 ENCOUNTER — Encounter: Payer: Self-pay | Admitting: Student

## 2024-04-23 VITALS — BP 131/90 | HR 81 | Ht 72.0 in | Wt 274.0 lb

## 2024-04-23 DIAGNOSIS — Z5181 Encounter for therapeutic drug level monitoring: Secondary | ICD-10-CM

## 2024-04-23 DIAGNOSIS — I4819 Other persistent atrial fibrillation: Secondary | ICD-10-CM | POA: Diagnosis not present

## 2024-04-23 DIAGNOSIS — Z79899 Other long term (current) drug therapy: Secondary | ICD-10-CM | POA: Diagnosis not present

## 2024-04-23 NOTE — Progress Notes (Signed)
  Electrophysiology Office Note:   Date:  04/23/2024  ID:  Gregory Barrett, DOB 21-Oct-1971, MRN [id number]  Primary Cardiologist: None Electrophysiologist: OLE ONEIDA HOLTS, MD      History of Present Illness:   Gregory Barrett is a 53 y.o. male with h/o HTN and AF s/p multiple ablations seen today for routine electrophysiology followup.   Since last being seen in our clinic the patient reports doing very well. No breakthrough AF of which he is aware,  he denies chest pain, palpitations, dyspnea, PND, orthopnea, nausea, vomiting, dizziness, syncope, edema, weight gain, or early satiety.   Review of systems complete and found to be negative unless listed in HPI.   EP Information / Studies Reviewed:    EKG is ordered today. Personal review as below.  EKG Interpretation Date/Time:  Friday April 23 2024 10:26:48 EDT Ventricular Rate:  81 PR Interval:  186 QRS Duration:  92 QT Interval:  392 QTC Calculation: 455 R Axis:   6  Text Interpretation: Normal sinus rhythm Normal ECG When compared with ECG of 28-Jan-2024 09:22, No significant change was found Confirmed by Lesia Sharper [phone]) on 04/23/2024 10:42:48 AM    Arrhythmia/Device History S/p PVI and additional Left atrial focus 03/2017 S/p PVI and posterior ablation 09/2023   Physical Exam:   VS:  BP (!) 131/90   Pulse 81   Ht 6' (1.829 m)   Wt 274 lb (124.3 kg)   SpO2 97%   BMI 37.16 kg/m    Wt Readings from Last 3 Encounters:  04/23/24 274 lb (124.3 kg)  01/28/24 278 lb 3.2 oz (126.2 kg)  01/23/24 279 lb (126.6 kg)     GEN: No acute distress NECK: No JVD; No carotid bruits CARDIAC: Regular rate and rhythm, no murmurs, rubs, gallops RESPIRATORY:  Clear to auscultation without rales, wheezing or rhonchi  ABDOMEN: Soft, non-tender, non-distended EXTREMITIES:  No edema; No deformity   ASSESSMENT AND PLAN:    Persistent AF S/p multiple ablations as above EKG today shows NSR with stable intervals Continue tikosyn  500  mcg BID. Wishes to continue in shared decision making Continue diltiazem  prn only Not on OAC with CHA2DS2VASc of only 1  Can consider loop recorder in the future if need to monitor for return   High Risk drug monitoring - Tikosyn  QT intervals appropriate on tikosyn  Labs today and then q 6 month visits.    HTN Stable on current regimen    Obesity Body mass index is 37.16 kg/m.  Encouraged lifestyle modification   Follow up with EP Team in 6 months  Signed, Sharper Prentice Lesia, PA-C

## 2024-04-23 NOTE — Patient Instructions (Signed)
 Medication Instructions:  Your physician recommends that you continue on your current medications as directed. Please refer to the Current Medication list given to you today.  *If you need a refill on your cardiac medications before your next appointment, please call your pharmacy*  Lab Work: BMET, MAG-TODAY If you have labs (blood work) drawn today and your tests are completely normal, you will receive your results only by: MyChart Message (if you have MyChart) OR A paper copy in the mail If you have any lab test that is abnormal or we need to change your treatment, we will call you to review the results.  Follow-Up: At [facility], you and your health needs are our priority.  As part of our continuing mission to provide you with exceptional heart care, our providers are all part of one team.  This team includes your primary Cardiologist (physician) and Advanced Practice Providers or APPs (Physician Assistants and Nurse Practitioners) who all work together to provide you with the care you need, when you need it.  Your next appointment:   6 month(s)  Provider:   You may see OLE ONEIDA HOLTS, MD or one of the following Advanced Practice Providers on your designated Care Team:   Charlies Arthur, [place] Ozell Jodie Passey, PA-C Suzann Riddle, NP Daphne Barrack, NP

## 2024-04-24 ENCOUNTER — Ambulatory Visit: Payer: Self-pay | Admitting: Student

## 2024-04-24 LAB — BASIC METABOLIC PANEL WITH GFR
BUN/Creatinine Ratio: 16 (ref 9–20)
BUN: 20 mg/dL (ref 6–24)
CO2: 21 mmol/L (ref 20–29)
Calcium: 9.3 mg/dL (ref 8.7–10.2)
Chloride: 102 mmol/L (ref 96–106)
Creatinine, Ser: 1.25 mg/dL (ref 0.76–1.27)
Glucose: 97 mg/dL (ref 70–99)
Potassium: 4.6 mmol/L (ref 3.5–5.2)
Sodium: 139 mmol/L (ref 134–144)
eGFR: 70 mL/min/{1.73_m2} (ref >59)

## 2024-04-24 LAB — MAGNESIUM: Magnesium: 2.2 mg/dL (ref 1.6–2.3)

## 2024-05-25 ENCOUNTER — Other Ambulatory Visit: Payer: Self-pay

## 2024-05-25 MED ORDER — DOFETILIDE 500 MCG PO CAPS: 500.0000 ug | ORAL_CAPSULE | Freq: Two times a day (BID) | ORAL | 3 refills | Status: AC

## 2024-05-26 ENCOUNTER — Encounter: Payer: Self-pay | Admitting: Cardiology

## 2024-05-27 ENCOUNTER — Other Ambulatory Visit (HOSPITAL_COMMUNITY): Payer: Self-pay | Admitting: *Deleted

## 2024-08-10 ENCOUNTER — Encounter: Payer: Self-pay | Admitting: Family Medicine

## 2024-08-10 ENCOUNTER — Ambulatory Visit (INDEPENDENT_AMBULATORY_CARE_PROVIDER_SITE_OTHER): Admitting: Family Medicine

## 2024-08-10 VITALS — BP 116/80 | HR 110 | Temp 98.0°F | Ht 72.0 in | Wt 268.0 lb

## 2024-08-10 DIAGNOSIS — Z1283 Encounter for screening for malignant neoplasm of skin: Secondary | ICD-10-CM | POA: Diagnosis not present

## 2024-08-10 DIAGNOSIS — Z1211 Encounter for screening for malignant neoplasm of colon: Secondary | ICD-10-CM

## 2024-08-10 DIAGNOSIS — L720 Epidermal cyst: Secondary | ICD-10-CM

## 2024-08-10 DIAGNOSIS — E669 Obesity, unspecified: Secondary | ICD-10-CM | POA: Diagnosis not present

## 2024-08-10 DIAGNOSIS — Z7689 Persons encountering health services in other specified circumstances: Secondary | ICD-10-CM

## 2024-08-10 MED ORDER — POTASSIUM CHLORIDE ER 10 MEQ PO TBCR: 10.0000 meq | EXTENDED_RELEASE_TABLET | Freq: Every day | ORAL | Status: DC

## 2024-08-10 MED ORDER — ZEPBOUND 2.5 MG/0.5ML ~~LOC~~ SOAJ
2.5000 mg | SUBCUTANEOUS | 0 refills | Status: DC
Start: 2024-08-10 — End: 2024-09-07

## 2024-08-10 NOTE — Progress Notes (Unsigned)
 New Patient Office Visit  Subjective   Patient ID: Gregory Barrett, male    DOB: 01-Jun-1971  Age: 53 y.o. MRN: [id number]  CC:  Chief Complaint  Patient presents with   Establish Care    HPI Gregory Barrett presents to establish care new provider.   Patients previous primary care provider: [facility] with Dr. Wolm Scarlet. Last seen 2021.  Specialist: [facility] at [facility] with Dr. Ozell Passey and Dr. Ole Holts   Patient is complaining of a knot on  his distal left pinky that has been there for 15 years. Uncertain of what it could be, but recently started having pain.   He is requesting a dermatology referral for skin cancer screening with having moles.   He would like to lose some weight. He is inquiring about taking a GLP-1. He has some cardiac history and would benefit from losing the weight. Based on chart review, he was instructed to obtain this through PCP.   Outpatient Encounter Medications as of 08/10/2024  Medication Sig   diltiazem  (CARDIZEM ) 30 MG tablet Take 1 Tablet Every 4 Hours As Needed For HR >100 and TOP BP >100   dofetilide  (TIKOSYN ) 500 MCG capsule Take 1 capsule (500 mcg total) by mouth 2 (two) times daily.   esomeprazole  (NEXIUM ) 20 MG capsule Take 1 capsule (20 mg total) by mouth daily.   [DISCONTINUED] potassium chloride  (KLOR-CON ) 10 MEQ tablet Take 2 tablets (20 mEq total) by mouth daily. (Patient taking differently: Take 10 mEq by mouth 2 (two) times daily.)   [DISCONTINUED] tirzepatide (ZEPBOUND) 2.5 MG/0.5ML Pen Inject 2.5 mg into the skin once a week for 28 days.   [DISCONTINUED] potassium chloride  (KLOR-CON ) 10 MEQ tablet Take 1 tablet (10 mEq total) by mouth daily.   No facility-administered encounter medications on file as of 08/10/2024.    Past Medical History:  Diagnosis Date   Anxiety 10/28/1998   when he was in A-fib, attributed to adrenal gland and fatigue   Aortic aneurysm    Arthritis ?    legs, ankles   GERD (gastroesophageal reflux disease) 10/28/2008   Gout    Hyperaldosteronism    Hypertension    Melanoma of lower back (HCC) 2000s   Migraine    none for years   Persistent atrial fibrillation (HCC)    Pneumonia    a few times; last time ~ 2010 (04/03/2017)    Past Surgical History:  Procedure Laterality Date   ATRIAL FIBRILLATION ABLATION N/A 04/03/2017   Procedure: Atrial Fibrillation Ablation;  Surgeon: Kelsie Agent, MD;  Location: [facility] INVASIVE CV LAB;  Service: Cardiovascular;  Laterality: N/A;   ATRIAL FIBRILLATION ABLATION N/A 10/17/2023   Procedure: ATRIAL FIBRILLATION ABLATION;  Surgeon: Holts Ole DASEN, MD;  Location: [facility] INVASIVE CV LAB;  Service: Cardiovascular;  Laterality: N/A;   CARDIOVERSION N/A 09/13/2016   Procedure: CARDIOVERSION;  Surgeon: Vina LULLA Gull, MD;  Location: [facility] ENDOSCOPY;  Service: Cardiovascular;  Laterality: N/A;   CARDIOVERSION N/A 10/02/2016   Procedure: CARDIOVERSION;  Surgeon: Ezra GORMAN Shuck, MD;  Location: [facility] ENDOSCOPY;  Service: Cardiovascular;  Laterality: N/A;   CARDIOVERSION N/A 04/10/2017   Procedure: CARDIOVERSION;  Surgeon: Gull Vina LULLA, MD;  Location: [facility] ENDOSCOPY;  Service: Cardiovascular;  Laterality: N/A;   CARDIOVERSION N/A 04/28/2017   Procedure: CARDIOVERSION;  Surgeon: Mona Vinie BROCKS, MD;  Location: [facility] ENDOSCOPY;  Service: Cardiovascular;  Laterality: N/A;   CARDIOVERSION N/A 04/25/2023   Procedure: CARDIOVERSION;  Surgeon: Hobart,  Powell BRAVO, MD;  Location: [facility];  Service: Cardiovascular;  Laterality: N/A;   CARDIOVERSION N/A 06/11/2023   Procedure: CARDIOVERSION;  Surgeon: Sheena Pugh, DO;  Location: [facility];  Service: Cardiovascular;  Laterality: N/A;   FRACTURE SURGERY     HARDWARE REMOVAL Right 12/12/2015   Procedure: HARDWARE REMOVAL RIGHT TIBIAL;  Surgeon: Ozell Bruch, MD;  Location: [facility] OR;  Service: Orthopedics;  Laterality: Right;   IR US  GUIDE VASC ACCESS RIGHT   07/16/2017   IR US  GUIDE VASC ACCESS RIGHT  07/16/2017   IR VENOGRAM ADRENAL BI  07/16/2017   IR VENOGRAM RENAL UNI LEFT  07/16/2017   IR VENOUS SAMPLING  07/16/2017   IR VENOUS SAMPLING  07/16/2017   JOINT REPLACEMENT     LAPAROSCOPIC ADRENALECTOMY Left 09/16/2017   LAPAROSCOPIC ADRENALECTOMY Left 09/16/2017   Procedure: LAPAROSCOPIC LEFT ADRENALECTOMY;  Surgeon: Eletha Boas, MD;  Location: [facility] OR;  Service: General;  Laterality: Left;   MELANOMA EXCISION     back   ORIF NONUNION / MALUNION TIBIAL FRACTURE Right 2005   put plates & screws in   TOTAL KNEE ARTHROPLASTY Right 03/19/2016   Procedure: RIGHT TOTAL KNEE ARTHROPLASTY;  Surgeon: Donnice Car, MD;  Location: [facility] ORS;  Service: Orthopedics;  Laterality: Right;   VASECTOMY      Family History  Problem Relation Age of Onset   Meniere's disease Mother    Bladder Cancer Father    Hypertension Maternal Grandmother    Cancer Maternal Grandfather        lung ca   Adrenal disorder Neg Hx     Social History   Socioeconomic History   Marital status: Married    Spouse name: Not on file   Number of children: 5   Years of education: Not on file   Highest education level: Some college, no degree  Occupational History   Not on file  Tobacco Use   Smoking status: Former    Current packs/day: 0.00    Average packs/day: 0.5 packs/day for 2.0 years (1.0 ttl pk-yrs)    Types: Cigarettes    Start date: 09/20/2008    Quit date: 09/20/2010    Years since quitting: 13.9   Smokeless tobacco: Current    Types: [place]    Last attempt to quit: 11/03/2014  Vaping Use   Vaping status: Some Days   Substances: Nicotine  Substance and Sexual Activity   Alcohol use: Yes    Alcohol/week: 3.0 standard drinks of alcohol    Types: 3 Standard drinks or equivalent per week    Comment: 3 drinks a month   Drug use: No   Sexual activity: Yes    Birth control/protection: None  Other Topics Concern   Not on file  Social History Narrative    Not on file   Social Drivers of Health   Financial Resource Strain: Low Risk  (08/06/2024)   Overall Financial Resource Strain (CARDIA)    Difficulty of Paying Living Expenses: Not hard at all  Food Insecurity: No Food Insecurity (08/06/2024)   Hunger Vital Sign    Worried About Running Out of Food in the Last Year: Never true    Ran Out of Food in the Last Year: Never true  Transportation Needs: No Transportation Needs (08/06/2024)   PRAPARE - Administrator, Civil Service (Medical): No    Lack of Transportation (Non-Medical): No  Physical Activity: Insufficiently Active (08/06/2024)   Exercise Vital Sign  Days of Exercise per Week: 5 days    Minutes of Exercise per Session: 20 min  Stress: No Stress Concern Present (08/06/2024)   [employer] of Occupational Health - Occupational Stress Questionnaire    Feeling of Stress: Only a little  Social Connections: Moderately Isolated (08/06/2024)   Social Connection and Isolation Panel    Frequency of Communication with Friends and Family: Three times a week    Frequency of Social Gatherings with Friends and Family: Once a week    Attends Religious Services: Never    Database administrator or Organizations: No    Attends Engineer, structural: Not on file    Marital Status: Married  Catering manager Violence: Not At Risk (06/09/2023)   Humiliation, Afraid, Rape, and Kick questionnaire    Fear of Current or Ex-Partner: No    Emotionally Abused: No    Physically Abused: No    Sexually Abused: No    ROS See HPI above    Objective  BP 116/80   Pulse (!) 110   Temp 98 F (36.7 C) (Oral)   Ht 6' (1.829 m)   Wt 268 lb (121.6 kg)   SpO2 97%   BMI 36.35 kg/m   Physical Exam Vitals reviewed.  Constitutional:      General: He is not in acute distress.    Appearance: Normal appearance. He is obese. He is not ill-appearing, toxic-appearing or diaphoretic.  HENT:     Head: Normocephalic and atraumatic.   Eyes:     General:        Right eye: No discharge.        Left eye: No discharge.     Conjunctiva/sclera: Conjunctivae normal.  Cardiovascular:     Rate and Rhythm: Normal rate and regular rhythm.     Heart sounds: Normal heart sounds. No murmur heard.    No friction rub. No gallop.  Pulmonary:     Effort: Pulmonary effort is normal. No respiratory distress.     Breath sounds: Normal breath sounds.  Musculoskeletal:        General: Normal range of motion.  Skin:    General: Skin is warm and dry.     Comments: Left pinky: Small, palpable cyst distal of the finger. No redness or swelling.   Neurological:     General: No focal deficit present.     Mental Status: He is alert and oriented to person, place, and time. Mental status is at baseline.  Psychiatric:        Mood and Affect: Mood normal.        Behavior: Behavior normal.        Thought Content: Thought content normal.        Judgment: Judgment normal.      Assessment & Plan:  Epidermoid cyst of finger -     Ambulatory referral to Dermatology  Obesity (BMI 30-39.9) -     CBC with Differential/Platelet; Future -     Comprehensive metabolic panel with GFR; Future -     Hemoglobin A1c; Future -     Lipid panel; Future -     TSH; Future  Colon cancer screening -     Cologuard  Skin cancer screening -     Ambulatory referral to Dermatology  Encounter to establish care  1.Review health maintenance:  -Cologuard: Ordered -Hep B vaccine: Had previously -Influenza vaccine: Declines  -PNA vaccine: Declines  -Zoster vaccine: Declines  -Covid vaccine: Declines  2.Ordered labs (  CBC, CMP, A1c, Lipid panel-fasting, and TSH) based on BMI-obesity. Recommend to make a lab appointment when able to fast. Office will call with lab results and will be available on MyChart.  3.Placed a referral to dermatology for cyst of finger and skin cancer screening. Please call the office or send a MyChart message if you do not receive a phone  call or a MyChart message about appointment in 2 weeks.  4.Discussed about GLPs and choose Zepbound. Prescribed Zepbound 2.5mg  injection every 7 days for 4 weeks.  Return in about 1 month (around 09/10/2024) for follow-up: Lab-fasting .   Lakasha Mcfall, NP

## 2024-08-11 ENCOUNTER — Other Ambulatory Visit: Payer: Self-pay

## 2024-08-11 ENCOUNTER — Encounter: Payer: Self-pay | Admitting: Family Medicine

## 2024-08-11 DIAGNOSIS — E669 Obesity, unspecified: Secondary | ICD-10-CM

## 2024-08-11 MED ORDER — ZEPBOUND 2.5 MG/0.5ML ~~LOC~~ SOAJ: 2.5000 mg | SUBCUTANEOUS | 0 refills | Status: DC

## 2024-08-12 ENCOUNTER — Other Ambulatory Visit (HOSPITAL_COMMUNITY): Payer: Self-pay

## 2024-08-12 ENCOUNTER — Telehealth: Payer: Self-pay

## 2024-08-12 ENCOUNTER — Other Ambulatory Visit

## 2024-08-12 ENCOUNTER — Other Ambulatory Visit: Payer: Self-pay

## 2024-08-12 DIAGNOSIS — E669 Obesity, unspecified: Secondary | ICD-10-CM

## 2024-08-12 DIAGNOSIS — I4819 Other persistent atrial fibrillation: Secondary | ICD-10-CM

## 2024-08-12 MED ORDER — ZEPBOUND 2.5 MG/0.5ML ~~LOC~~ SOAJ: 2.5000 mg | SUBCUTANEOUS | 0 refills | Status: AC

## 2024-08-12 NOTE — Patient Instructions (Addendum)
-  It was nice to meet you and look forward to taking care of you.  -Ordered cologuard for colon cancer screening.  -Ordered labs. Recommend to make a lab appointment when able to fast. Office will call with lab results and will be available on MyChart.  -Placed a referral to dermatology for cyst of finger and skin cancer screening. Please call the office or send a MyChart message if you do not receive a phone call or a MyChart message about appointment in 2 weeks.  -Discussed about GLPs and choose Zepbound. Prescribed Zepbound 2.5mg  injection every 7 days for 4 weeks.  -Follow up in 1 month.

## 2024-08-12 NOTE — Telephone Encounter (Signed)
 Clinical questions have been answered and PA submitted. PA currently Pending. Please be advised that most companies allow up to 30 days to make a decision. We will advise when a determination has been made, or follow up in 1 week.   Please reach out to our team, Rx Prior Auth Pool, if you haven't heard back in a week.

## 2024-08-12 NOTE — Telephone Encounter (Signed)
 Pharmacy Patient Advocate Encounter   Received notification from Pt Calls Messages that prior authorization for Zepbound2.5 is required/requested.   Insurance verification completed.   The patient is insured through [facility].   Per test claim: PA required; PA started via CoverMyMeds. KEY [id number] . Waiting for clinical questions to populate.

## 2024-08-13 ENCOUNTER — Ambulatory Visit: Payer: Self-pay | Admitting: Family Medicine

## 2024-08-13 ENCOUNTER — Other Ambulatory Visit (HOSPITAL_COMMUNITY): Payer: Self-pay

## 2024-08-13 DIAGNOSIS — R195 Other fecal abnormalities: Secondary | ICD-10-CM

## 2024-08-13 LAB — CBC WITH DIFFERENTIAL/PLATELET
Basophils Absolute: 0.1 K/uL (ref 0.0–0.1)
Basophils Relative: 1 % (ref 0.0–3.0)
Eosinophils Absolute: 0.4 K/uL (ref 0.0–0.7)
Eosinophils Relative: 4 % (ref 0.0–5.0)
HCT: 48.1 % (ref 39.0–52.0)
Hemoglobin: 16.3 g/dL (ref 13.0–17.0)
Lymphocytes Relative: 29 % (ref 12.0–46.0)
Lymphs Abs: 2.6 K/uL (ref 0.7–4.0)
MCHC: 33.8 g/dL (ref 30.0–36.0)
MCV: 91.1 fl (ref 78.0–100.0)
Monocytes Absolute: 1 K/uL (ref 0.1–1.0)
Monocytes Relative: 11.5 % (ref 3.0–12.0)
Neutro Abs: 4.9 K/uL (ref 1.4–7.7)
Neutrophils Relative %: 54.5 % (ref 43.0–77.0)
Platelets: 288 K/uL (ref 150.0–400.0)
RBC: 5.28 Mil/uL (ref 4.22–5.81)
RDW: 12.7 % (ref 11.5–15.5)
WBC: 9 K/uL (ref 4.0–10.5)

## 2024-08-13 LAB — COMPREHENSIVE METABOLIC PANEL WITH GFR
ALT: 29 U/L (ref 0–53)
AST: 26 U/L (ref 0–37)
Albumin: 4.6 g/dL (ref 3.5–5.2)
Alkaline Phosphatase: 61 U/L (ref 39–117)
BUN: 25 mg/dL — ABNORMAL HIGH (ref 6–23)
CO2: 26 meq/L (ref 19–32)
Calcium: 9 mg/dL (ref 8.4–10.5)
Chloride: 103 meq/L (ref 96–112)
Creatinine, Ser: 1.33 mg/dL (ref 0.40–1.50)
GFR: 62.16 mL/min (ref >60.00)
Glucose, Bld: 71 mg/dL (ref 70–99)
Potassium: 4.5 meq/L (ref 3.5–5.1)
Sodium: 139 meq/L (ref 135–145)
Total Bilirubin: 1 mg/dL (ref 0.2–1.2)
Total Protein: 7.5 g/dL (ref 6.0–8.3)

## 2024-08-13 LAB — LIPID PANEL
Cholesterol: 172 mg/dL (ref 0–200)
HDL: 37.8 mg/dL — ABNORMAL LOW (ref >39.00)
LDL Cholesterol: 115 mg/dL — ABNORMAL HIGH (ref 0–99)
NonHDL: 134.12
Total CHOL/HDL Ratio: 5
Triglycerides: 94 mg/dL (ref 0.0–149.0)
VLDL: 18.8 mg/dL (ref 0.0–40.0)

## 2024-08-13 LAB — HEMOGLOBIN A1C: Hgb A1c MFr Bld: 5.6 % (ref 4.6–6.5)

## 2024-08-13 LAB — TSH: TSH: 1.71 u[IU]/mL (ref 0.35–5.50)

## 2024-08-13 NOTE — Telephone Encounter (Signed)
 Patient notified

## 2024-08-13 NOTE — Telephone Encounter (Signed)
 Pharmacy Patient Advocate Encounter  Received notification from [facility] that Prior Authorization for Zepbound 2.5 has been APPROVED from 08/12/24 to 02/08/25. Ran test claim, Copay is $24.99. This test claim was processed through [facility]- copay amounts may vary at other pharmacies due to pharmacy/plan contracts, or as the patient moves through the different stages of their insurance plan.   PA #/Case ID/Reference #: # [id number]

## 2024-08-27 ENCOUNTER — Other Ambulatory Visit (HOSPITAL_COMMUNITY): Payer: Self-pay | Admitting: Physician Assistant

## 2024-08-28 LAB — COLOGUARD: COLOGUARD: POSITIVE — AB

## 2024-09-10 ENCOUNTER — Encounter: Payer: Self-pay | Admitting: Family Medicine

## 2024-09-10 ENCOUNTER — Ambulatory Visit (INDEPENDENT_AMBULATORY_CARE_PROVIDER_SITE_OTHER): Admitting: Family Medicine

## 2024-09-10 VITALS — BP 136/82 | HR 90 | Temp 97.8°F | Ht 72.0 in | Wt 265.0 lb

## 2024-09-10 DIAGNOSIS — E669 Obesity, unspecified: Secondary | ICD-10-CM | POA: Diagnosis not present

## 2024-09-10 MED ORDER — TIRZEPATIDE-WEIGHT MANAGEMENT 5 MG/0.5ML ~~LOC~~ SOAJ
5.0000 mg | SUBCUTANEOUS | 0 refills | Status: AC
Start: 2024-09-10 — End: 2024-10-02

## 2024-09-10 NOTE — Progress Notes (Signed)
   Established Patient Office Visit   Subjective:  Patient ID: KALUM MINNER, male    DOB: 16-Oct-1971  Age: 53 y.o. MRN: [id number]  Chief Complaint  Patient presents with   Medical Management of Chronic Issues    1 month follow up     HPI Obesity: On previous visit, he started Zepbound 2.5mg  weekly injection. He reports he is tolerating medication with minimal side effects. His biggest complaint is increase in GERD symptoms. He takes Nexium  20mg , but will sometimes not be as effective. He will drink more water . Since starting Zepbound he has cut down chewing tobacco.  ROS See HPI above     Objective:   BP 136/82   Pulse 90   Temp 97.8 F (36.6 C) (Oral)   Ht 6' (1.829 m)   Wt 265 lb (120.2 kg)   SpO2 98%   BMI 35.94 kg/m  Wt Readings from Last 3 Encounters:  09/10/24 265 lb (120.2 kg)  08/10/24 268 lb (121.6 kg)  04/23/24 274 lb (124.3 kg)    Physical Exam Vitals reviewed.  Constitutional:      General: He is not in acute distress.    Appearance: Normal appearance. He is obese. He is not ill-appearing, toxic-appearing or diaphoretic.  HENT:     Head: Normocephalic and atraumatic.  Eyes:     General:        Right eye: No discharge.        Left eye: No discharge.     Conjunctiva/sclera: Conjunctivae normal.  Cardiovascular:     Rate and Rhythm: Normal rate.  Pulmonary:     Effort: Pulmonary effort is normal. No respiratory distress.  Musculoskeletal:        General: Normal range of motion.  Skin:    General: Skin is warm and dry.  Neurological:     General: No focal deficit present.     Mental Status: He is alert and oriented to person, place, and time. Mental status is at baseline.  Psychiatric:        Mood and Affect: Mood normal.        Behavior: Behavior normal.        Thought Content: Thought content normal.        Judgment: Judgment normal.      Assessment & Plan:  Obesity (BMI 30-39.9) -     Tirzepatide-Weight Management; Inject 5 mg into the  skin once a week for 4 doses.  Dispense: 2 mL; Refill: 0  -Tolerating Zepbound with some weight loss. Increase Zepbound to 5.mg injection for 4 weeks. Advise to send a MyChart message at the last injection to either increase or stay at the same dose every 4 weeks.  -Encourage to call gastroenterology to have colonoscopy for positive cologuard.   Return in about 3 months (around 12/11/2024) for physical.   Samyuktha Brau, NP

## 2024-09-10 NOTE — Patient Instructions (Addendum)
-  It was good to see you today. -Tolerating Zepbound with some weight loss. Increase Zepbound to 5.mg injection for 4 weeks. Advise to send a MyChart message at the last injection to either increase or stay at the same dose every 4 weeks.  -Encourage to call gastroenterology to have colonoscopy for positive cologuard.  -Follow up in 3 months for a physical.

## 2024-09-29 ENCOUNTER — Encounter: Payer: Self-pay | Admitting: Family Medicine

## 2024-10-04 ENCOUNTER — Encounter: Payer: Self-pay | Admitting: Family Medicine

## 2024-10-04 DIAGNOSIS — I4819 Other persistent atrial fibrillation: Secondary | ICD-10-CM

## 2024-10-04 MED ORDER — ZEPBOUND 7.5 MG/0.5ML ~~LOC~~ SOAJ: 7.5000 mg | SUBCUTANEOUS | 0 refills | Status: AC

## 2024-10-05 ENCOUNTER — Telehealth: Payer: Self-pay | Admitting: *Deleted

## 2024-10-05 NOTE — Telephone Encounter (Signed)
 Dr. Abran,   Per Norleen pt with hx of difficult intubation.   Please advise if patient needs an OV or can be scheduled directly.   Thank you

## 2024-10-05 NOTE — Telephone Encounter (Signed)
 Team,  This pt is a documented difficult intubation and his procedure will need to be done at the hospital.   Thanks,  Cathryn Cobb

## 2024-10-06 ENCOUNTER — Other Ambulatory Visit: Payer: Self-pay

## 2024-10-06 DIAGNOSIS — Z1211 Encounter for screening for malignant neoplasm of colon: Secondary | ICD-10-CM

## 2024-10-06 NOTE — Telephone Encounter (Signed)
 Reviewed.  Ran it by Mr. Dyan, to be certain about the airway This patient can be scheduled directly at the hospital, without an office visit, with any provider who has availability.  Reason colon cancer screening. Thanks

## 2024-10-06 NOTE — Telephone Encounter (Signed)
 Pt scheduled for colon at [facility] with Dr. Abran 11/10/24 at 9:15am.

## 2024-10-06 NOTE — Telephone Encounter (Signed)
 Please see attached, and make pt aware of new procedure date. Pts PV is 12/15.   Thank you

## 2024-10-08 ENCOUNTER — Encounter: Payer: Self-pay | Admitting: Emergency Medicine

## 2024-10-11 ENCOUNTER — Ambulatory Visit

## 2024-10-11 VITALS — Ht 72.0 in | Wt 261.2 lb

## 2024-10-11 DIAGNOSIS — R195 Other fecal abnormalities: Secondary | ICD-10-CM

## 2024-10-11 MED ORDER — NA SULFATE-K SULFATE-MG SULF 17.5-3.13-1.6 GM/177ML PO SOLN: 1.0000 | Freq: Once | ORAL | 0 refills | Status: AC

## 2024-10-11 NOTE — Progress Notes (Signed)
 No egg or soy allergy known to patient  No issues known to pt with past sedation with any surgeries or procedures Patient marked positive for difficulty with intubation in chart and procedure being done at [facility] endo  No FH of Malignant Hyperthermia Pt is on diet pills- Takes Zepbound  and hold instructions provided Pt is not on  home 02  Pt is not on blood thinners  Pt denies issues with constipation  Positive hx for  A fib  Have any cardiac testing pending--No Pt can ambulate  Pt uses chewing tobacco and was instructed to hold after midnight day of procedure Discussed diabetic I weight loss medication holds Discussed NSAID holds Checked BMI Pt instructed to use Singlecare.com or GoodRx for a price reduction on prep  Patient's chart reviewed by Norleen Schillings CNRA prior to previsit and patient appropriate for the [facility].  Pre visit completed and red dot placed by patient's name on their procedure day (on provider's schedule).

## 2024-10-26 ENCOUNTER — Encounter: Admitting: Internal Medicine

## 2024-10-27 ENCOUNTER — Encounter (HOSPITAL_COMMUNITY): Payer: Self-pay | Admitting: Internal Medicine

## 2024-10-27 NOTE — Progress Notes (Signed)
 Attempted to obtain medical history for pre op call via telephone, unable to reach at this time. HIPAA compliant voicemail message left requesting return call to pre surgical testing department.

## 2024-11-02 ENCOUNTER — Ambulatory Visit: Admitting: Dermatology

## 2024-11-03 ENCOUNTER — Encounter: Payer: Self-pay | Admitting: Internal Medicine

## 2024-11-03 ENCOUNTER — Encounter: Payer: Self-pay | Admitting: Family Medicine

## 2024-11-05 ENCOUNTER — Telehealth: Payer: Self-pay

## 2024-11-05 NOTE — Telephone Encounter (Signed)
 Procedure:COLONOSCOPY Procedure date: 11/10/24 Procedure location: [facility] Arrival Time: 7:48 Spoke with the patient Y/N: Y Any prep concerns? N Has the patient obtained the prep from the pharmacy ? Y Do you have a care partner and transportation: Y Any additional concerns? N

## 2024-11-08 ENCOUNTER — Telehealth: Payer: Self-pay | Admitting: Internal Medicine

## 2024-11-08 NOTE — Telephone Encounter (Signed)
 PT is calling to cancel the procedure on 1/14.

## 2024-11-09 NOTE — Telephone Encounter (Signed)
 Pt stated he wanted to cancel his hosp procedure because he was going to have to pay out of pocket for it and he cannot afford to do so. Appt cancelled and Dr. Abran notified.

## 2024-11-10 ENCOUNTER — Ambulatory Visit (HOSPITAL_COMMUNITY): Admission: RE | Admit: 2024-11-10 | Source: Home / Self Care | Admitting: Internal Medicine

## 2024-11-10 ENCOUNTER — Encounter (HOSPITAL_COMMUNITY): Admission: RE | Payer: Self-pay | Source: Home / Self Care

## 2024-11-10 SURGERY — COLONOSCOPY
Anesthesia: Monitor Anesthesia Care

## 2024-12-13 ENCOUNTER — Ambulatory Visit: Admitting: Family Medicine

## 2024-12-30 ENCOUNTER — Ambulatory Visit: Admitting: Dermatology
